# Patient Record
Sex: Female | Born: 1945
Health system: Southern US, Community
[De-identification: ages and names within clinical notes are randomized; demographics above are authoritative.]

## PROBLEM LIST (undated history)

## (undated) DIAGNOSIS — F32A Depression, unspecified: Secondary | ICD-10-CM

## (undated) DIAGNOSIS — E039 Hypothyroidism, unspecified: Secondary | ICD-10-CM

## (undated) DIAGNOSIS — K219 Gastro-esophageal reflux disease without esophagitis: Secondary | ICD-10-CM

## (undated) DIAGNOSIS — F329 Major depressive disorder, single episode, unspecified: Secondary | ICD-10-CM

## (undated) DIAGNOSIS — F419 Anxiety disorder, unspecified: Secondary | ICD-10-CM

## (undated) DIAGNOSIS — I10 Essential (primary) hypertension: Secondary | ICD-10-CM

## (undated) DIAGNOSIS — J449 Chronic obstructive pulmonary disease, unspecified: Secondary | ICD-10-CM

## (undated) DIAGNOSIS — E119 Type 2 diabetes mellitus without complications: Secondary | ICD-10-CM

## (undated) DIAGNOSIS — E785 Hyperlipidemia, unspecified: Secondary | ICD-10-CM

## (undated) DIAGNOSIS — K760 Fatty (change of) liver, not elsewhere classified: Secondary | ICD-10-CM

## (undated) HISTORY — DX: Essential (primary) hypertension: I10

## (undated) HISTORY — DX: Major depressive disorder, single episode, unspecified: F32.9

## (undated) HISTORY — DX: Depression, unspecified: F32.A

## (undated) HISTORY — DX: Gastro-esophageal reflux disease without esophagitis: K21.9

## (undated) HISTORY — DX: Anxiety disorder, unspecified: F41.9

## (undated) HISTORY — DX: Chronic obstructive pulmonary disease, unspecified: J44.9

## (undated) HISTORY — DX: Fatty (change of) liver, not elsewhere classified: K76.0

## (undated) HISTORY — DX: Type 2 diabetes mellitus without complications: E11.9

## (undated) HISTORY — PX: BREAST BIOPSY: SHX20

## (undated) HISTORY — DX: Hyperlipidemia, unspecified: E78.5

## (undated) HISTORY — DX: Hypothyroidism, unspecified: E03.9

## (undated) HISTORY — PX: TUBAL LIGATION: SHX77

---

## 2003-02-16 ENCOUNTER — Other Ambulatory Visit: Admission: RE | Admit: 2003-02-16 | Discharge: 2003-02-16 | Payer: Self-pay

## 2003-08-24 ENCOUNTER — Other Ambulatory Visit: Admission: RE | Admit: 2003-08-24 | Discharge: 2003-08-24 | Payer: Self-pay | Admitting: Dermatology

## 2004-12-21 ENCOUNTER — Ambulatory Visit: Payer: Self-pay | Admitting: Cardiology

## 2004-12-25 ENCOUNTER — Ambulatory Visit: Payer: Self-pay | Admitting: Cardiology

## 2010-10-25 ENCOUNTER — Other Ambulatory Visit (HOSPITAL_COMMUNITY): Payer: Self-pay | Admitting: "Endocrinology

## 2010-10-25 DIAGNOSIS — E049 Nontoxic goiter, unspecified: Secondary | ICD-10-CM

## 2010-10-30 ENCOUNTER — Ambulatory Visit (HOSPITAL_COMMUNITY)
Admission: RE | Admit: 2010-10-30 | Discharge: 2010-10-30 | Disposition: A | Discharge status: Home / Self Care | Source: Ambulatory Visit | Attending: "Endocrinology | Admitting: "Endocrinology

## 2010-10-30 DIAGNOSIS — E049 Nontoxic goiter, unspecified: Secondary | ICD-10-CM | POA: Insufficient documentation

## 2010-11-13 ENCOUNTER — Encounter: Payer: Self-pay | Admitting: Cardiovascular Disease

## 2011-05-03 ENCOUNTER — Other Ambulatory Visit (HOSPITAL_COMMUNITY): Payer: Self-pay | Admitting: "Endocrinology

## 2011-05-03 DIAGNOSIS — E049 Nontoxic goiter, unspecified: Secondary | ICD-10-CM

## 2011-05-17 DIAGNOSIS — R072 Precordial pain: Secondary | ICD-10-CM

## 2011-07-02 ENCOUNTER — Ambulatory Visit (HOSPITAL_COMMUNITY)
Admission: RE | Admit: 2011-07-02 | Discharge: 2011-07-02 | Disposition: A | Discharge status: Home / Self Care | Source: Ambulatory Visit | Attending: "Endocrinology | Admitting: "Endocrinology

## 2011-07-02 DIAGNOSIS — E042 Nontoxic multinodular goiter: Secondary | ICD-10-CM | POA: Insufficient documentation

## 2011-07-02 DIAGNOSIS — E049 Nontoxic goiter, unspecified: Secondary | ICD-10-CM

## 2012-08-05 DIAGNOSIS — R0789 Other chest pain: Secondary | ICD-10-CM

## 2012-10-15 ENCOUNTER — Other Ambulatory Visit (HOSPITAL_COMMUNITY): Payer: Self-pay | Admitting: "Endocrinology

## 2012-10-15 DIAGNOSIS — E049 Nontoxic goiter, unspecified: Secondary | ICD-10-CM

## 2012-11-13 ENCOUNTER — Ambulatory Visit (HOSPITAL_COMMUNITY)

## 2012-11-14 ENCOUNTER — Ambulatory Visit (HOSPITAL_COMMUNITY)
Admission: RE | Admit: 2012-11-14 | Discharge: 2012-11-14 | Disposition: A | Discharge status: Home / Self Care | Payer: Medicare Other | Source: Ambulatory Visit | Attending: "Endocrinology | Admitting: "Endocrinology

## 2012-11-14 DIAGNOSIS — E042 Nontoxic multinodular goiter: Secondary | ICD-10-CM | POA: Insufficient documentation

## 2012-11-14 DIAGNOSIS — E049 Nontoxic goiter, unspecified: Secondary | ICD-10-CM

## 2013-11-09 ENCOUNTER — Other Ambulatory Visit (HOSPITAL_COMMUNITY): Payer: Self-pay | Admitting: "Endocrinology

## 2013-11-09 DIAGNOSIS — E042 Nontoxic multinodular goiter: Secondary | ICD-10-CM

## 2013-12-07 ENCOUNTER — Ambulatory Visit (HOSPITAL_COMMUNITY): Payer: Medicare Other

## 2013-12-08 ENCOUNTER — Ambulatory Visit (HOSPITAL_COMMUNITY)
Admission: RE | Admit: 2013-12-08 | Discharge: 2013-12-08 | Disposition: A | Discharge status: Home / Self Care | Payer: Medicare Other | Source: Ambulatory Visit | Attending: "Endocrinology | Admitting: "Endocrinology

## 2013-12-08 DIAGNOSIS — E042 Nontoxic multinodular goiter: Secondary | ICD-10-CM | POA: Diagnosis not present

## 2014-10-25 ENCOUNTER — Other Ambulatory Visit (HOSPITAL_COMMUNITY): Payer: Self-pay | Admitting: Internal Medicine

## 2014-10-25 DIAGNOSIS — F17200 Nicotine dependence, unspecified, uncomplicated: Secondary | ICD-10-CM

## 2014-10-28 ENCOUNTER — Ambulatory Visit (HOSPITAL_COMMUNITY)
Admission: RE | Admit: 2014-10-28 | Discharge: 2014-10-28 | Disposition: A | Discharge status: Home / Self Care | Payer: Medicare Other | Source: Ambulatory Visit | Attending: Internal Medicine | Admitting: Internal Medicine

## 2014-10-28 DIAGNOSIS — J439 Emphysema, unspecified: Secondary | ICD-10-CM | POA: Diagnosis not present

## 2014-10-28 DIAGNOSIS — F17200 Nicotine dependence, unspecified, uncomplicated: Secondary | ICD-10-CM

## 2014-11-23 ENCOUNTER — Other Ambulatory Visit: Payer: Self-pay | Admitting: "Endocrinology

## 2014-12-14 LAB — HEMOGLOBIN A1C: HEMOGLOBIN A1C: 5.8 % (ref 4.0–6.0)

## 2014-12-16 ENCOUNTER — Ambulatory Visit: Payer: Self-pay | Admitting: "Endocrinology

## 2014-12-22 ENCOUNTER — Encounter: Payer: Self-pay | Admitting: "Endocrinology

## 2014-12-22 ENCOUNTER — Ambulatory Visit (INDEPENDENT_AMBULATORY_CARE_PROVIDER_SITE_OTHER): Payer: Medicare Other | Admitting: "Endocrinology

## 2014-12-22 VITALS — BP 114/72 | HR 73 | Ht 67.0 in | Wt 205.0 lb

## 2014-12-22 DIAGNOSIS — E038 Other specified hypothyroidism: Secondary | ICD-10-CM | POA: Insufficient documentation

## 2014-12-22 DIAGNOSIS — R7303 Prediabetes: Secondary | ICD-10-CM | POA: Diagnosis not present

## 2014-12-22 NOTE — Progress Notes (Signed)
Subjective:    Patient ID: Dana Lawson, female    DOB: May 06, 1945, PCP Kirstie Peri, MD   Past Medical History  Diagnosis Date  . Hyperlipidemia   . Hypothyroidism   . Hypertension    Past Surgical History  Procedure Laterality Date  . Tubal ligation     Social History   Social History  . Marital Status: Married    Spouse Name: N/A  . Number of Children: N/A  . Years of Education: N/A   Social History Main Topics  . Smoking status: Current Every Day Smoker  . Smokeless tobacco: None  . Alcohol Use: No  . Drug Use: No  . Sexual Activity: Not Asked   Other Topics Concern  . None   Social History Narrative  . None   Outpatient Encounter Prescriptions as of 12/22/2014  Medication Sig  . aspirin 81 MG tablet Take 81 mg by mouth daily.  Marland Kitchen atorvastatin (LIPITOR) 20 MG tablet Take 20 mg by mouth daily.  . benazepril (LOTENSIN) 40 MG tablet Take 40 mg by mouth daily.  Marland Kitchen buPROPion (WELLBUTRIN SR) 150 MG 12 hr tablet Take 150 mg by mouth 2 (two) times daily.  Marland Kitchen esomeprazole (NEXIUM) 40 MG capsule Take 40 mg by mouth daily at 12 noon.  . furosemide (LASIX) 20 MG tablet Take 20 mg by mouth.  . levothyroxine (SYNTHROID, LEVOTHROID) 88 MCG tablet Take 88 mcg by mouth daily before breakfast.  . metFORMIN (GLUCOPHAGE) 500 MG tablet TAKE 1 TABLET TWICE A DAY  . potassium chloride (K-DUR) 10 MEQ tablet Take 10 mEq by mouth daily.  . Vitamin D, Cholecalciferol, 400 UNITS TABS Take by mouth.  . clonazePAM (KLONOPIN) 0.5 MG tablet Take 0.5 mg by mouth 2 (two) times daily as needed for anxiety. Reported on 12/22/2014   No facility-administered encounter medications on file as of 12/22/2014.   ALLERGIES: Allergies  Allergen Reactions  . Flagyl [Metronidazole]    VACCINATION STATUS:  There is no immunization history on file for this patient.  HPI  69 yr old female with hypothyroidism, prediabetes, and obesity.she returns for f/u of same.  She is currently on LT4 88 mcg  po qam, metformin 500mg  po qday. She continues to improve. She lost 24 lbs overall. denies cold/heat intolerance. no interval issues.  her thyroid u/s is s/f for stable nodules. She feels better. she denies dysphagia, SOB, voice change.   Review of Systems   Constitutional: Has a steady weight since last visit , no fatigue, no subjective hyperthermia/hypothermia Eyes: no blurry vision, no xerophthalmia ENT: no sore throat, no nodules palpated in throat, no dysphagia/odynophagia, no hoarseness Cardiovascular: no CP/SOB/palpitations/leg swelling Respiratory: no cough/SOB Gastrointestinal: no N/V/D/C Musculoskeletal: no muscle/joint aches Skin: no rashes Neurological: no tremors/numbness/tingling/dizziness Psychiatric: no depression/anxiety  Objective:    BP 114/72 mmHg  Pulse 73  Ht 5\' 7"  (1.702 m)  Wt 205 lb (92.987 kg)  BMI 32.10 kg/m2  SpO2 95%  Wt Readings from Last 3 Encounters:  12/22/14 205 lb (92.987 kg)    Physical Exam  Constitutional: overweight, in NAD Eyes: PERRLA, EOMI, no exophthalmos ENT: moist mucous membranes, no thyromegaly, no cervical lymphadenopathy Cardiovascular: RRR, No MRG Respiratory: CTA B Gastrointestinal: abdomen soft, NT, ND, BS+ Musculoskeletal: no deformities, strength intact in all 4 Skin: moist, warm, no rashes Neurological: no tremor with outstretched hands, DTR normal in all 4    Assessment & Plan:   1. Other specified hypothyroidism TFTs are stable. she will continue LT4 88  mcg po qam.  - We discussed about correct intake of levothyroxine, at fasting, with water, separated by at least 30 minutes from breakfast, and separated by more than 4 hours from calcium, iron, multivitamins, acid reflux medications (PPIs). -Patient is made aware of the fact that thyroid hormone replacement is needed for life, dose to be adjusted by periodic monitoring of thyroid function tests. Her thyroid ultrasound is s/f stable nodules .  2. Pre-diabetes    She has controlled prediabetes with A1c of  5.8%, continue MTF 500mg  podaily. she lost 24 lbs . I recounseled her with CHO restriction and regular exercise.  I gave her dietary advise.    - I advised patient to maintain close follow up with [facility], MD for primary care needs. Follow up plan: Return in about 6 months (around 06/22/2015) for prediabetes, underactive thyroid, follow up with pre-visit labs.  06/24/2015, MD Phone: [phone]  Fax: [phone]   12/22/2014, 4:37 PM

## 2015-02-03 DIAGNOSIS — J438 Other emphysema: Secondary | ICD-10-CM | POA: Diagnosis not present

## 2015-02-20 ENCOUNTER — Other Ambulatory Visit: Payer: Self-pay | Admitting: "Endocrinology

## 2015-03-29 DIAGNOSIS — F17200 Nicotine dependence, unspecified, uncomplicated: Secondary | ICD-10-CM | POA: Diagnosis not present

## 2015-03-29 DIAGNOSIS — Z6833 Body mass index (BMI) 33.0-33.9, adult: Secondary | ICD-10-CM | POA: Diagnosis not present

## 2015-03-29 DIAGNOSIS — Z1389 Encounter for screening for other disorder: Secondary | ICD-10-CM | POA: Diagnosis not present

## 2015-03-29 DIAGNOSIS — F329 Major depressive disorder, single episode, unspecified: Secondary | ICD-10-CM | POA: Diagnosis not present

## 2015-05-06 DIAGNOSIS — J449 Chronic obstructive pulmonary disease, unspecified: Secondary | ICD-10-CM | POA: Diagnosis not present

## 2015-05-06 DIAGNOSIS — M069 Rheumatoid arthritis, unspecified: Secondary | ICD-10-CM | POA: Diagnosis not present

## 2015-05-21 ENCOUNTER — Other Ambulatory Visit: Payer: Self-pay | Admitting: "Endocrinology

## 2015-05-25 DIAGNOSIS — M069 Rheumatoid arthritis, unspecified: Secondary | ICD-10-CM | POA: Diagnosis not present

## 2015-05-25 DIAGNOSIS — J449 Chronic obstructive pulmonary disease, unspecified: Secondary | ICD-10-CM | POA: Diagnosis not present

## 2015-06-23 ENCOUNTER — Ambulatory Visit: Payer: Medicare Other | Admitting: "Endocrinology

## 2015-07-13 ENCOUNTER — Other Ambulatory Visit: Payer: Self-pay | Admitting: "Endocrinology

## 2015-07-13 DIAGNOSIS — E063 Autoimmune thyroiditis: Secondary | ICD-10-CM

## 2015-07-13 DIAGNOSIS — E038 Other specified hypothyroidism: Secondary | ICD-10-CM | POA: Diagnosis not present

## 2015-07-14 LAB — T4, FREE: Free T4: 1.3 ng/dL (ref 0.8–1.8)

## 2015-07-14 LAB — TSH: TSH: 2.72 mIU/L

## 2015-07-25 ENCOUNTER — Ambulatory Visit: Payer: Medicare Other | Admitting: "Endocrinology

## 2015-07-28 DIAGNOSIS — J449 Chronic obstructive pulmonary disease, unspecified: Secondary | ICD-10-CM | POA: Diagnosis not present

## 2015-07-28 DIAGNOSIS — E7800 Pure hypercholesterolemia, unspecified: Secondary | ICD-10-CM | POA: Diagnosis not present

## 2015-07-28 DIAGNOSIS — K219 Gastro-esophageal reflux disease without esophagitis: Secondary | ICD-10-CM | POA: Diagnosis not present

## 2015-07-28 DIAGNOSIS — Z299 Encounter for prophylactic measures, unspecified: Secondary | ICD-10-CM | POA: Diagnosis not present

## 2015-07-28 DIAGNOSIS — F329 Major depressive disorder, single episode, unspecified: Secondary | ICD-10-CM | POA: Diagnosis not present

## 2015-08-11 ENCOUNTER — Encounter: Payer: Self-pay | Admitting: "Endocrinology

## 2015-08-11 ENCOUNTER — Ambulatory Visit (INDEPENDENT_AMBULATORY_CARE_PROVIDER_SITE_OTHER): Payer: Medicare Other | Admitting: "Endocrinology

## 2015-08-11 ENCOUNTER — Other Ambulatory Visit: Payer: Self-pay | Admitting: *Deleted

## 2015-08-11 VITALS — BP 114/76 | HR 85 | Wt 214.4 lb

## 2015-08-11 DIAGNOSIS — R7303 Prediabetes: Secondary | ICD-10-CM

## 2015-08-11 DIAGNOSIS — E038 Other specified hypothyroidism: Secondary | ICD-10-CM

## 2015-08-11 NOTE — Progress Notes (Signed)
Subjective:    Patient ID: Dana Lawson, female    DOB: 11-08-1945, PCP Kirstie Peri, MD   Past Medical History:  Diagnosis Date  . Hyperlipidemia   . Hypertension   . Hypothyroidism    Past Surgical History:  Procedure Laterality Date  . TUBAL LIGATION     Social History   Social History  . Marital status: Married    Spouse name: N/A  . Number of children: N/A  . Years of education: N/A   Social History Main Topics  . Smoking status: Current Every Day Smoker  . Smokeless tobacco: None  . Alcohol use No  . Drug use: No  . Sexual activity: Not Asked   Other Topics Concern  . None   Social History Narrative  . None   Outpatient Encounter Prescriptions as of 08/11/2015  Medication Sig  . omeprazole (PRILOSEC) 10 MG capsule Take 10 mg by mouth daily.  Marland Kitchen aspirin 81 MG tablet Take 81 mg by mouth daily.  Marland Kitchen atorvastatin (LIPITOR) 20 MG tablet Take 20 mg by mouth daily.  . benazepril (LOTENSIN) 40 MG tablet Take 40 mg by mouth daily.  Marland Kitchen buPROPion (WELLBUTRIN SR) 150 MG 12 hr tablet Take 150 mg by mouth 2 (two) times daily.  . furosemide (LASIX) 20 MG tablet Take 20 mg by mouth.  . levothyroxine (SYNTHROID, LEVOTHROID) 88 MCG tablet Take 88 mcg by mouth daily before breakfast.  . metFORMIN (GLUCOPHAGE) 500 MG tablet TAKE 1 TABLET TWICE A DAY  . potassium chloride (K-DUR) 10 MEQ tablet Take 10 mEq by mouth daily.  . Vitamin D, Cholecalciferol, 400 UNITS TABS Take by mouth.  . [DISCONTINUED] clonazePAM (KLONOPIN) 0.5 MG tablet Take 0.5 mg by mouth 2 (two) times daily as needed for anxiety. Reported on 12/22/2014  . [DISCONTINUED] esomeprazole (NEXIUM) 40 MG capsule Take 40 mg by mouth daily at 12 noon.   No facility-administered encounter medications on file as of 08/11/2015.    ALLERGIES: Allergies  Allergen Reactions  . Flagyl [Metronidazole]    VACCINATION STATUS:  There is no immunization history on file for this patient.  HPI  70 yr old female with  hypothyroidism, prediabetes, and obesity.she returns for f/u of same.  She is currently on LT4 88 mcg po qam, metformin 500mg  po qday. She continues to improve. She  has gained 10 pounds after she lost 24 lbs overall. denies cold/heat intolerance. no interval issues.  her thyroid u/s is s/f for stable nodules. She feels better. she denies dysphagia, SOB, voice change.   Review of Systems   Constitutional: Has a steady weight since last visit , no fatigue, no subjective hyperthermia/hypothermia Eyes: no blurry vision, no xerophthalmia ENT: no sore throat, no nodules palpated in throat, no dysphagia/odynophagia, no hoarseness Cardiovascular: no CP/SOB/palpitations/leg swelling Respiratory: no cough/SOB Gastrointestinal: no N/V/D/C Musculoskeletal: no muscle/joint aches Skin: no rashes Neurological: no tremors/numbness/tingling/dizziness Psychiatric: no depression/anxiety  Objective:    BP 114/76   Pulse 85   Wt 214 lb 6 oz (97.2 kg)   BMI 33.58 kg/m   Wt Readings from Last 3 Encounters:  08/11/15 214 lb 6 oz (97.2 kg)  12/22/14 205 lb (93 kg)    Physical Exam  Constitutional: obese, in NAD Eyes: PERRLA, EOMI, no exophthalmos ENT: moist mucous membranes, no thyromegaly, no cervical lymphadenopathy Cardiovascular: RRR, No MRG Respiratory: CTA B Gastrointestinal: abdomen soft, NT, ND, BS+ Musculoskeletal: no deformities, strength intact in all 4 Skin: moist, warm, no rashes Neurological: no tremor with  outstretched hands, DTR normal in all 4    Assessment & Plan:   1. Other specified hypothyroidism TFTs are Consistent with appropriate replacement with thyroid hormone. she will continue LT4 88 mcg po qam.  - We discussed about correct intake of levothyroxine, at fasting, with water, separated by at least 30 minutes from breakfast, and separated by more than 4 hours from calcium, iron, multivitamins, acid reflux medications (PPIs). -Patient is made aware of the fact that  thyroid hormone replacement is needed for life, dose to be adjusted by periodic monitoring of thyroid function tests. Her thyroid ultrasound is s/f stable nodules .  2. Pre-diabetes   She has controlled prediabetes with A1c of  5.8%, continue MTF 500mg  podaily. She has regained 10 pounds after lost 24 lbs . I recounseled her with CHO restriction and regular exercise.  I gave her dietary advise.    - I advised patient to maintain close follow up with [facility], MD for primary care needs. Follow up plan: Return in about 6 months (around 02/11/2016) for follow up with pre-visit labs.  04/10/2016, MD Phone: [phone]  Fax: [phone]   08/11/2015, 3:51 PM

## 2015-08-11 NOTE — Patient Instructions (Signed)

## 2015-08-21 ENCOUNTER — Other Ambulatory Visit: Payer: Self-pay | Admitting: "Endocrinology

## 2015-08-23 DIAGNOSIS — M069 Rheumatoid arthritis, unspecified: Secondary | ICD-10-CM | POA: Diagnosis not present

## 2015-08-23 DIAGNOSIS — J449 Chronic obstructive pulmonary disease, unspecified: Secondary | ICD-10-CM | POA: Diagnosis not present

## 2015-10-11 DIAGNOSIS — E7800 Pure hypercholesterolemia, unspecified: Secondary | ICD-10-CM | POA: Diagnosis not present

## 2015-10-11 DIAGNOSIS — Z1211 Encounter for screening for malignant neoplasm of colon: Secondary | ICD-10-CM | POA: Diagnosis not present

## 2015-10-11 DIAGNOSIS — E559 Vitamin D deficiency, unspecified: Secondary | ICD-10-CM | POA: Diagnosis not present

## 2015-10-11 DIAGNOSIS — Z6833 Body mass index (BMI) 33.0-33.9, adult: Secondary | ICD-10-CM | POA: Diagnosis not present

## 2015-10-11 DIAGNOSIS — Z299 Encounter for prophylactic measures, unspecified: Secondary | ICD-10-CM | POA: Diagnosis not present

## 2015-10-11 DIAGNOSIS — R5383 Other fatigue: Secondary | ICD-10-CM | POA: Diagnosis not present

## 2015-10-11 DIAGNOSIS — E039 Hypothyroidism, unspecified: Secondary | ICD-10-CM | POA: Diagnosis not present

## 2015-10-11 DIAGNOSIS — Z7189 Other specified counseling: Secondary | ICD-10-CM | POA: Diagnosis not present

## 2015-10-11 DIAGNOSIS — Z9229 Personal history of other drug therapy: Secondary | ICD-10-CM | POA: Diagnosis not present

## 2015-10-11 DIAGNOSIS — Z0000 Encounter for general adult medical examination without abnormal findings: Secondary | ICD-10-CM | POA: Diagnosis not present

## 2015-10-11 DIAGNOSIS — E1165 Type 2 diabetes mellitus with hyperglycemia: Secondary | ICD-10-CM | POA: Diagnosis not present

## 2015-10-11 DIAGNOSIS — Z1389 Encounter for screening for other disorder: Secondary | ICD-10-CM | POA: Diagnosis not present

## 2015-10-12 DIAGNOSIS — Z23 Encounter for immunization: Secondary | ICD-10-CM | POA: Diagnosis not present

## 2015-10-14 DIAGNOSIS — J449 Chronic obstructive pulmonary disease, unspecified: Secondary | ICD-10-CM | POA: Diagnosis not present

## 2015-10-14 DIAGNOSIS — M069 Rheumatoid arthritis, unspecified: Secondary | ICD-10-CM | POA: Diagnosis not present

## 2015-11-12 IMAGING — US US SOFT TISSUE HEAD/NECK
1 series · 14 of 25 positions shown · non-contrast
Comparison: 11/14/2012

CLINICAL DATA: 68-year-old female with a history of thyroid
nodules.

EXAM:
THYROID ULTRASOUND
TECHNIQUE: Ultrasound examination of the thyroid gland and adjacent soft
tissues was performed.

[Series 1: us soft tissue head/neck · 0.07mm/px · 14 of 33 slices shown]
[im 1/33]
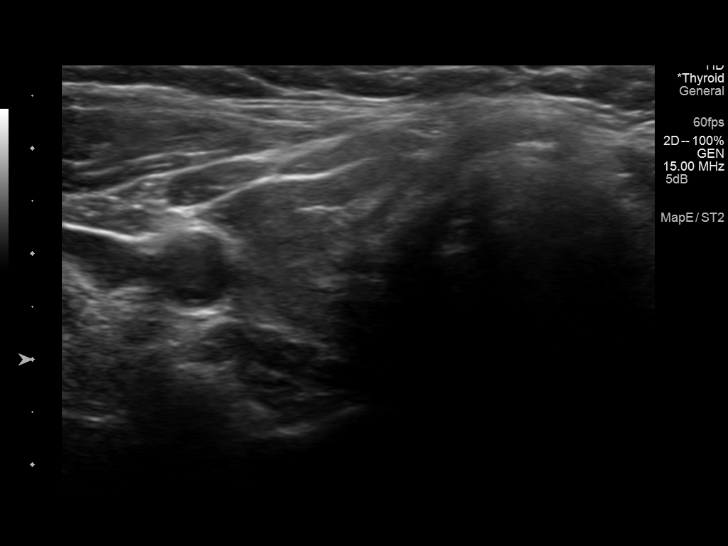
[im 3/33]
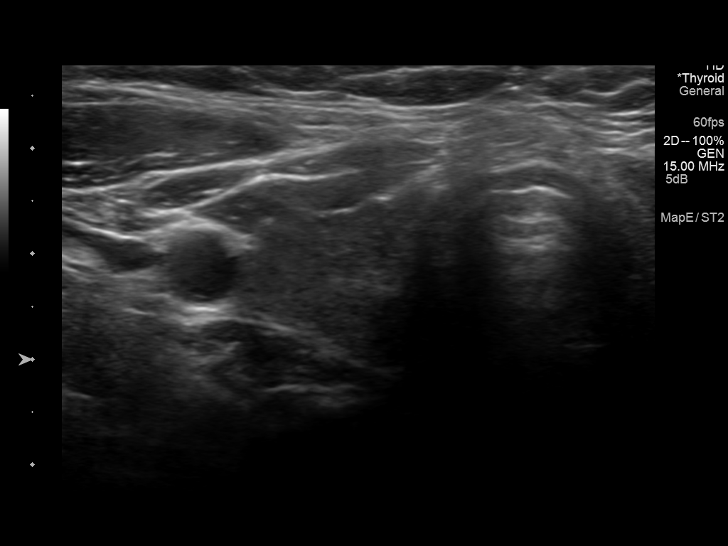
[im 6/33]
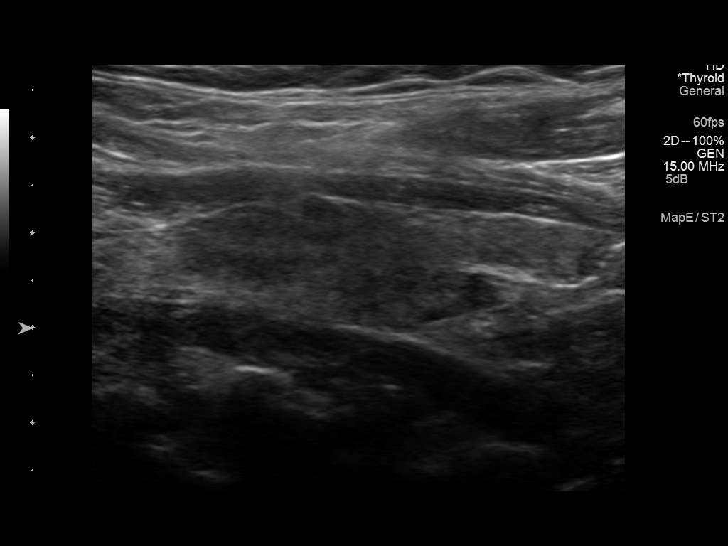
[im 9/33]
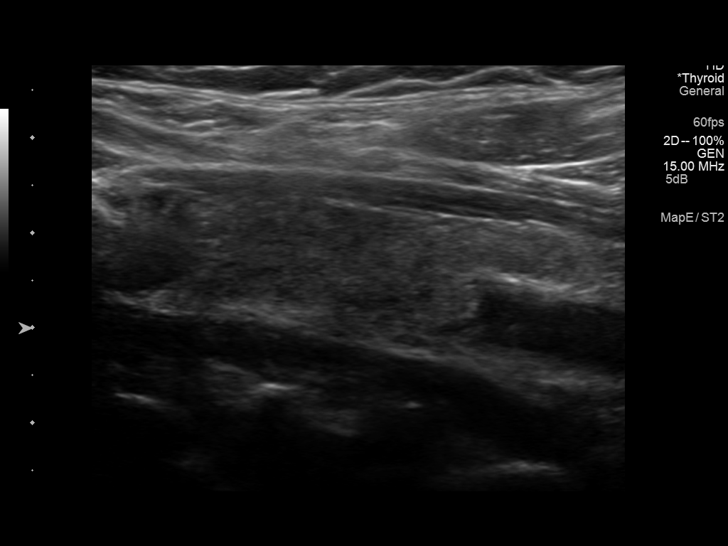
[im 11/33]
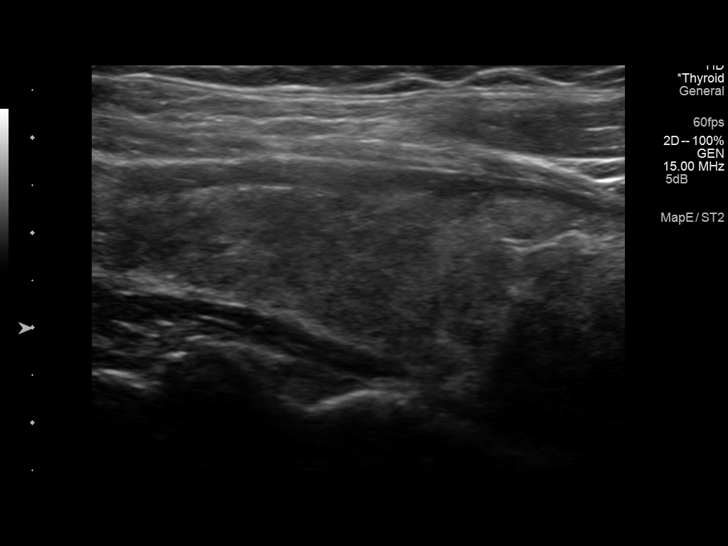
[im 13/33]
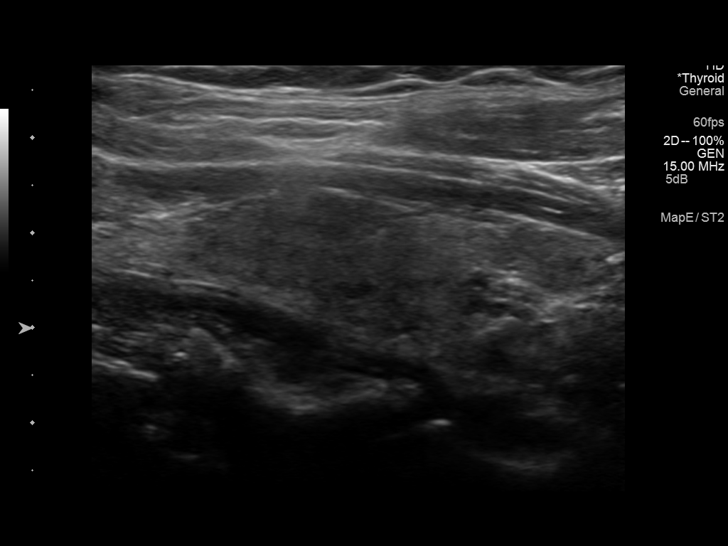
[im 15/33]
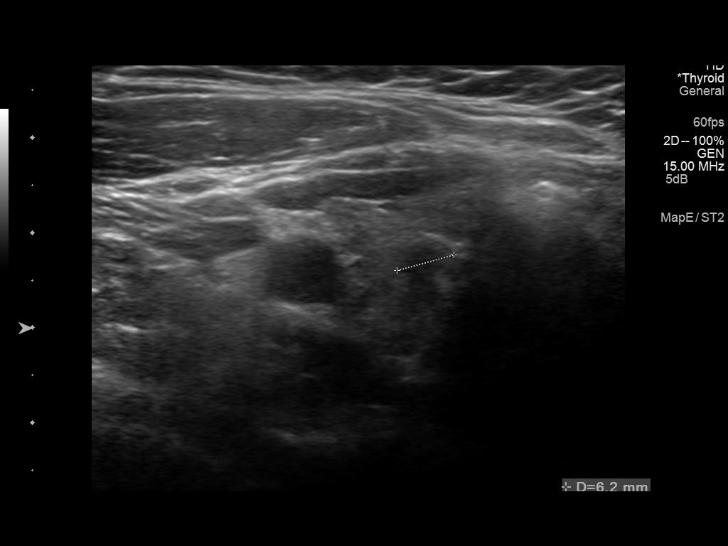
[im 18/33]
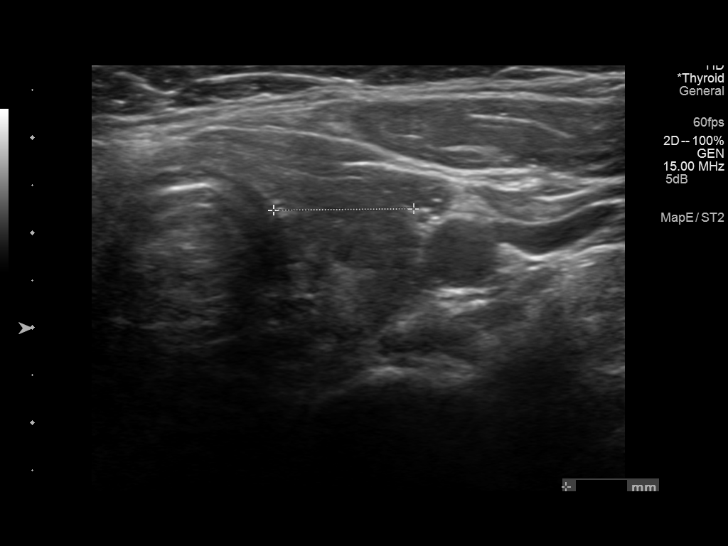
[im 21/33]
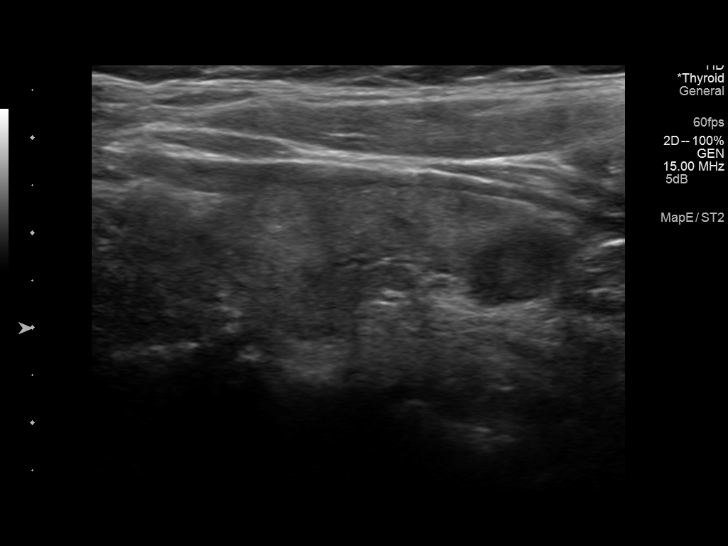
[im 22/33]
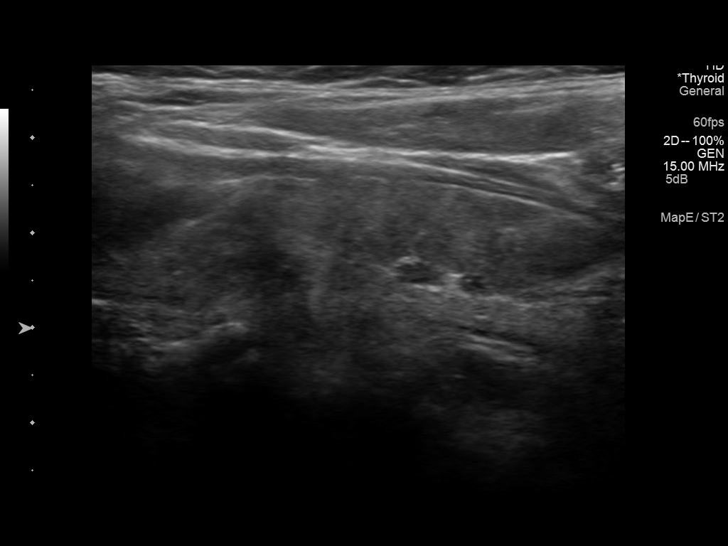
[im 25/33]
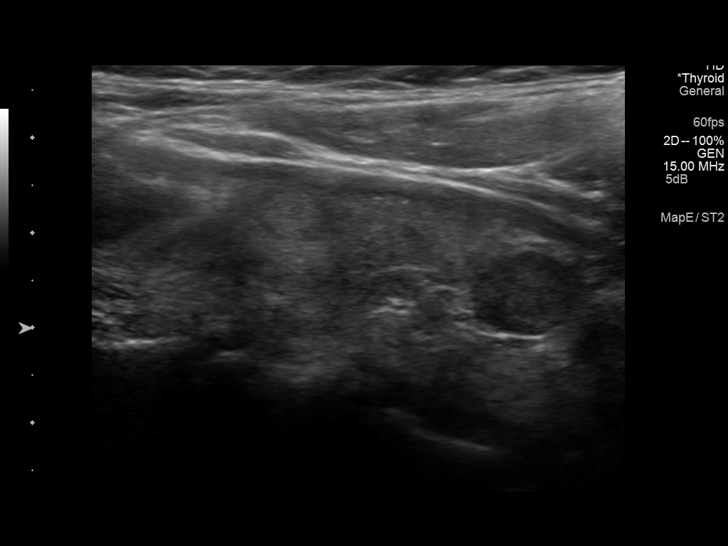
[im 27/33]
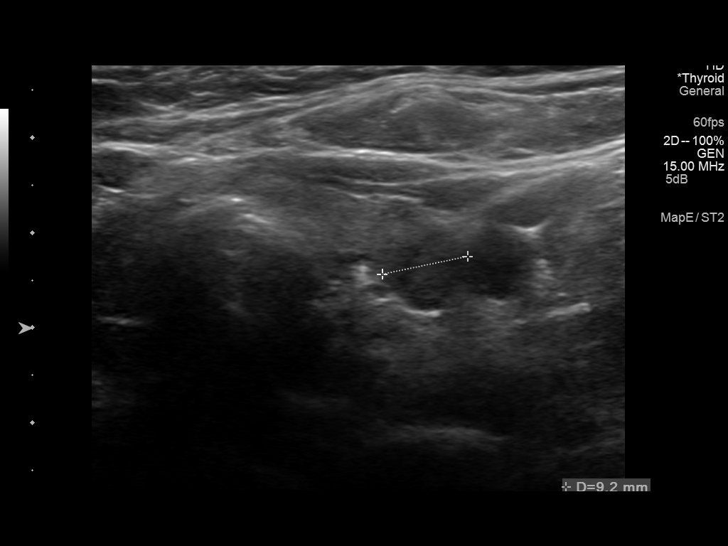
[im 30/33]
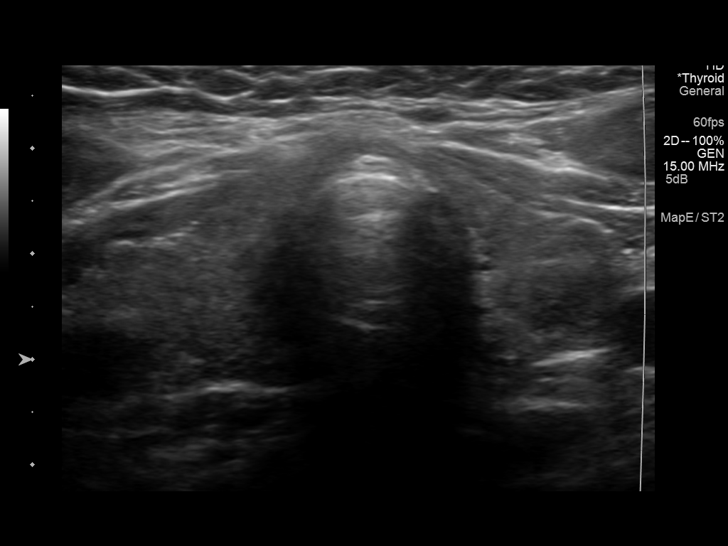
[im 33/33]
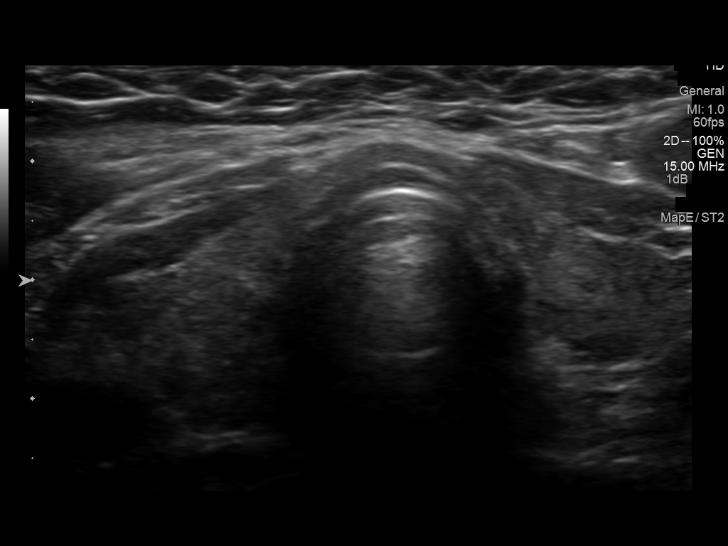

[14 of 25 positions shown; findings below may reference images not displayed]

FINDINGS: Right thyroid lobe

Measurements: 4.8 cm x 1.4 cm x 1.7 cm. Small heterogeneously echoic
nodule at the inferior pole of the right thyroid measures 1.0 cm x 5
mm x 6 mm (previously 1.0 cm x 6 mm x 5 mm).

Left thyroid lobe

Measurements: 4.5 cm x 1.7 cm x 1.5 cm. Small hypoechoic nodule at
the inferior pole the left thyroid measures 1.2 cm x 1.0 cm x 9 mm
(previously 1.2 cm x 9 mm x 9 mm.)

Isthmus

Thickness: 2 mm.  No nodules visualized.

Lymphadenopathy

None visualized.
IMPRESSION: Bilateral thyroid nodules, unchanged from comparison.

Findings do not meet current SRU consensus criteria for biopsy.
Follow-up by clinical exam is recommended. If patient has known risk
factors for thyroid carcinoma, consider follow-up ultrasound in 12
months. If patient is clinically hyperthyroid, consider nuclear
medicine thyroid uptake and scan.Reference: Management of Thyroid
Nodules Detected at US: Society of Radiologists in Ultrasound

## 2015-11-20 ENCOUNTER — Other Ambulatory Visit: Payer: Self-pay | Admitting: "Endocrinology

## 2015-11-28 DIAGNOSIS — H40013 Open angle with borderline findings, low risk, bilateral: Secondary | ICD-10-CM | POA: Diagnosis not present

## 2015-11-28 DIAGNOSIS — Z7984 Long term (current) use of oral hypoglycemic drugs: Secondary | ICD-10-CM | POA: Diagnosis not present

## 2015-11-28 DIAGNOSIS — E119 Type 2 diabetes mellitus without complications: Secondary | ICD-10-CM | POA: Diagnosis not present

## 2015-11-28 DIAGNOSIS — H2513 Age-related nuclear cataract, bilateral: Secondary | ICD-10-CM | POA: Diagnosis not present

## 2015-12-19 DIAGNOSIS — L900 Lichen sclerosus et atrophicus: Secondary | ICD-10-CM | POA: Diagnosis not present

## 2015-12-19 DIAGNOSIS — E669 Obesity, unspecified: Secondary | ICD-10-CM | POA: Diagnosis not present

## 2015-12-19 DIAGNOSIS — N644 Mastodynia: Secondary | ICD-10-CM | POA: Diagnosis not present

## 2015-12-19 DIAGNOSIS — Z01419 Encounter for gynecological examination (general) (routine) without abnormal findings: Secondary | ICD-10-CM | POA: Diagnosis not present

## 2015-12-19 DIAGNOSIS — Z6835 Body mass index (BMI) 35.0-35.9, adult: Secondary | ICD-10-CM | POA: Diagnosis not present

## 2015-12-19 DIAGNOSIS — N898 Other specified noninflammatory disorders of vagina: Secondary | ICD-10-CM | POA: Diagnosis not present

## 2015-12-28 DIAGNOSIS — N644 Mastodynia: Secondary | ICD-10-CM | POA: Diagnosis not present

## 2016-02-01 DIAGNOSIS — M1990 Unspecified osteoarthritis, unspecified site: Secondary | ICD-10-CM | POA: Diagnosis not present

## 2016-02-01 DIAGNOSIS — M79672 Pain in left foot: Secondary | ICD-10-CM | POA: Diagnosis not present

## 2016-02-09 ENCOUNTER — Other Ambulatory Visit: Payer: Self-pay | Admitting: "Endocrinology

## 2016-02-09 DIAGNOSIS — E038 Other specified hypothyroidism: Secondary | ICD-10-CM | POA: Diagnosis not present

## 2016-02-09 DIAGNOSIS — R7303 Prediabetes: Secondary | ICD-10-CM | POA: Diagnosis not present

## 2016-02-10 LAB — COMPLETE METABOLIC PANEL WITH GFR
ALK PHOS: 77 U/L (ref 33–130)
ALT: 40 U/L — ABNORMAL HIGH (ref 6–29)
AST: 37 U/L — AB (ref 10–35)
Albumin: 4 g/dL (ref 3.6–5.1)
BUN: 18 mg/dL (ref 7–25)
CALCIUM: 10.4 mg/dL (ref 8.6–10.4)
CHLORIDE: 103 mmol/L (ref 98–110)
CO2: 28 mmol/L (ref 20–31)
CREATININE: 1.13 mg/dL — AB (ref 0.50–0.99)
GFR, Est African American: 58 mL/min — ABNORMAL LOW (ref >=60)
GFR, Est Non African American: 50 mL/min — ABNORMAL LOW (ref >=60)
Glucose, Bld: 150 mg/dL — ABNORMAL HIGH (ref 65–99)
Potassium: 4.4 mmol/L (ref 3.5–5.3)
Sodium: 141 mmol/L (ref 135–146)
TOTAL PROTEIN: 6.7 g/dL (ref 6.1–8.1)
Total Bilirubin: 0.5 mg/dL (ref 0.2–1.2)

## 2016-02-10 LAB — T4, FREE: Free T4: 1.5 ng/dL (ref 0.8–1.8)

## 2016-02-10 LAB — TSH: TSH: 4.25 mIU/L

## 2016-02-10 LAB — HEMOGLOBIN A1C
Hgb A1c MFr Bld: 5.9 % — ABNORMAL HIGH (ref <5.7)
Mean Plasma Glucose: 123 mg/dL

## 2016-02-16 ENCOUNTER — Encounter: Payer: Self-pay | Admitting: "Endocrinology

## 2016-02-16 ENCOUNTER — Ambulatory Visit: Payer: Medicare Other | Admitting: "Endocrinology

## 2016-02-16 ENCOUNTER — Ambulatory Visit (INDEPENDENT_AMBULATORY_CARE_PROVIDER_SITE_OTHER): Payer: Medicare Other | Admitting: "Endocrinology

## 2016-02-16 VITALS — BP 139/77 | HR 82 | Ht 67.0 in | Wt 228.0 lb

## 2016-02-16 DIAGNOSIS — R7303 Prediabetes: Secondary | ICD-10-CM

## 2016-02-16 DIAGNOSIS — E038 Other specified hypothyroidism: Secondary | ICD-10-CM

## 2016-02-16 MED ORDER — LEVOTHYROXINE SODIUM 100 MCG PO TABS: 100.0000 ug | ORAL_TABLET | Freq: Every day | ORAL | 2 refills | Status: DC

## 2016-02-16 NOTE — Progress Notes (Signed)
Subjective:    Patient ID: Dana Lawson, female    DOB: 02-15-1945, PCP Kirstie Peri, MD   Past Medical History:  Diagnosis Date  . Hyperlipidemia   . Hypertension   . Hypothyroidism    Past Surgical History:  Procedure Laterality Date  . TUBAL LIGATION     Social History   Social History  . Marital status: Married    Spouse name: N/A  . Number of children: N/A  . Years of education: N/A   Social History Main Topics  . Smoking status: Current Every Day Smoker  . Smokeless tobacco: Never Used  . Alcohol use No  . Drug use: No  . Sexual activity: Not Asked   Other Topics Concern  . None   Social History Narrative  . None   Outpatient Encounter Prescriptions as of 02/16/2016  Medication Sig  . aspirin 81 MG tablet Take 81 mg by mouth daily.  Marland Kitchen atorvastatin (LIPITOR) 20 MG tablet Take 20 mg by mouth daily.  . benazepril (LOTENSIN) 40 MG tablet Take 40 mg by mouth daily.  Marland Kitchen buPROPion (WELLBUTRIN SR) 150 MG 12 hr tablet Take 150 mg by mouth 2 (two) times daily.  . furosemide (LASIX) 20 MG tablet Take 20 mg by mouth.  . levothyroxine (SYNTHROID, LEVOTHROID) 100 MCG tablet Take 1 tablet (100 mcg total) by mouth daily before breakfast.  . meloxicam (MOBIC) 15 MG tablet   . metFORMIN (GLUCOPHAGE) 500 MG tablet Take 1 tablet (500 mg total) by mouth daily.  Marland Kitchen omeprazole (PRILOSEC) 10 MG capsule Take 10 mg by mouth daily.  . potassium chloride (K-DUR) 10 MEQ tablet Take 10 mEq by mouth daily.  . Vitamin D, Cholecalciferol, 400 UNITS TABS Take by mouth.  . [DISCONTINUED] levothyroxine (SYNTHROID, LEVOTHROID) 88 MCG tablet Take 88 mcg by mouth daily before breakfast.   No facility-administered encounter medications on file as of 02/16/2016.    ALLERGIES: Allergies  Allergen Reactions  . Flagyl [Metronidazole]    VACCINATION STATUS:  There is no immunization history on file for this patient.  HPI  71 yr old female with hypothyroidism, prediabetes, and obesity.she  returns for f/u of same.  She is currently on LT4 88 mcg po qam, metformin 500mg  po qday. She continues to improve. She  has gained weight. denies cold/heat intolerance. no interval issues.  her thyroid u/s is s/f for stable nodules. She feels better. she denies dysphagia, SOB, voice change.   Review of Systems   Constitutional: Has a steady weight gaint , no fatigue, no subjective hyperthermia/hypothermia Eyes: no blurry vision, no xerophthalmia ENT: no sore throat, no nodules palpated in throat, no dysphagia/odynophagia, no hoarseness Cardiovascular: no CP/SOB/palpitations/leg swelling Respiratory: no cough/SOB Gastrointestinal: no N/V/D/C Musculoskeletal: no muscle/joint aches Skin: no rashes Neurological: no tremors/numbness/tingling/dizziness Psychiatric: no depression/anxiety  Objective:    BP 139/77   Pulse 82   Ht 5\' 7"  (1.702 m)   Wt 228 lb (103.4 kg)   BMI 35.71 kg/m   Wt Readings from Last 3 Encounters:  02/16/16 228 lb (103.4 kg)  08/11/15 214 lb 6 oz (97.2 kg)  12/22/14 205 lb (93 kg)    Physical Exam  Constitutional: obese, in NAD Eyes: PERRLA, EOMI, no exophthalmos ENT: moist mucous membranes, no thyromegaly, no cervical lymphadenopathy Cardiovascular: RRR, No MRG Respiratory: CTA B Gastrointestinal: abdomen soft, NT, ND, BS+ Musculoskeletal: no deformities, strength intact in all 4 Skin: moist, warm, no rashes Neurological: no tremor with outstretched hands, DTR normal in all 4  Recent Results (from the past 2160 hour(s))  COMPLETE METABOLIC PANEL WITH GFR     Status: Abnormal   Collection Time: 02/09/16  2:07 PM  Result Value Ref Range   Sodium 141 135 - 146 mmol/L   Potassium 4.4 3.5 - 5.3 mmol/L   Chloride 103 98 - 110 mmol/L   CO2 28 20 - 31 mmol/L   Glucose, Bld 150 (H) 65 - 99 mg/dL   BUN 18 7 - 25 mg/dL   Creat 0.24 (H) 0.97 - 0.99 mg/dL    Comment:   For patients > or = 69 years of age: The upper reference limit for Creatinine is  approximately 13% higher for people identified as African-American.      Total Bilirubin 0.5 0.2 - 1.2 mg/dL   Alkaline Phosphatase 77 33 - 130 U/L   AST 37 (H) 10 - 35 U/L   ALT 40 (H) 6 - 29 U/L   Total Protein 6.7 6.1 - 8.1 g/dL   Albumin 4.0 3.6 - 5.1 g/dL   Calcium 35.3 8.6 - 29.9 mg/dL   GFR, Est African American 58 (L) >=60 mL/min   GFR, Est Non African American 50 (L) >=60 mL/min  TSH     Status: None   Collection Time: 02/09/16  2:07 PM  Result Value Ref Range   TSH 4.25 mIU/L    Comment:   Reference Range   > or = 20 Years  0.40-4.50   Pregnancy Range First trimester  0.26-2.66 Second trimester 0.55-2.73 Third trimester  0.43-2.91     T4, free     Status: None   Collection Time: 02/09/16  2:07 PM  Result Value Ref Range   Free T4 1.5 0.8 - 1.8 ng/dL  Hemoglobin M4Q     Status: Abnormal   Collection Time: 02/09/16  2:07 PM  Result Value Ref Range   Hgb A1c MFr Bld 5.9 (H) <5.7 %    Comment:   For someone without known diabetes, a hemoglobin A1c value between 5.7% and 6.4% is consistent with prediabetes and should be confirmed with a follow-up test.   For someone with known diabetes, a value <7% indicates that their diabetes is well controlled. A1c targets should be individualized based on duration of diabetes, age, co-morbid conditions and other considerations.   This assay result is consistent with an increased risk of diabetes.   Currently, no consensus exists regarding use of hemoglobin A1c for diagnosis of diabetes in children.      Mean Plasma Glucose 123 mg/dL     Assessment & Plan:   1. Other specified hypothyroidism TFTs are Consistent with under replacement with thyroid hormone. - I will increase her levothyroxine to 100 g by mouth every morning.  - We discussed about correct intake of levothyroxine, at fasting, with water, separated by at least 30 minutes from breakfast, and separated by more than 4 hours from calcium, iron,  multivitamins, acid reflux medications (PPIs). -Patient is made aware of the fact that thyroid hormone replacement is needed for life, dose to be adjusted by periodic monitoring of thyroid function tests. Her thyroid ultrasound is s/f stable nodules .  2. Pre-diabetes   She has controlled prediabetes with A1c of  5.9%, continue MTF 500mg  podaily. She has regained 14 more  pounds  since last visit.  I recounseled her with CHO restriction and regular exercise.  I gave her dietary advise.    - I advised patient to maintain close follow up with [facility],  MD for primary care needs. Follow up plan: Return in about 6 months (around 08/15/2016) for follow up with pre-visit labs.  Marquis Lunch, MD Phone: [phone]  Fax: [phone]   02/16/2016, 4:50 PM

## 2016-03-05 DIAGNOSIS — N898 Other specified noninflammatory disorders of vagina: Secondary | ICD-10-CM | POA: Diagnosis not present

## 2016-03-05 DIAGNOSIS — Z6836 Body mass index (BMI) 36.0-36.9, adult: Secondary | ICD-10-CM | POA: Diagnosis not present

## 2016-03-05 DIAGNOSIS — Z124 Encounter for screening for malignant neoplasm of cervix: Secondary | ICD-10-CM | POA: Diagnosis not present

## 2016-04-03 DIAGNOSIS — J449 Chronic obstructive pulmonary disease, unspecified: Secondary | ICD-10-CM | POA: Diagnosis not present

## 2016-04-03 DIAGNOSIS — M069 Rheumatoid arthritis, unspecified: Secondary | ICD-10-CM | POA: Diagnosis not present

## 2016-05-03 ENCOUNTER — Telehealth: Payer: Self-pay | Admitting: "Endocrinology

## 2016-05-03 NOTE — Telephone Encounter (Signed)
Called pt. She wanted to verify which levothyroxine dosage she is supposed to be on.

## 2016-05-03 NOTE — Telephone Encounter (Signed)
Dana Lawson has questions about her levothyroxine (SYNTHROID, LEVOTHROID) 100 MCG tablet dosage, please advise?

## 2016-05-14 DIAGNOSIS — M79673 Pain in unspecified foot: Secondary | ICD-10-CM | POA: Diagnosis not present

## 2016-05-14 DIAGNOSIS — R600 Localized edema: Secondary | ICD-10-CM | POA: Diagnosis not present

## 2016-05-14 DIAGNOSIS — M069 Rheumatoid arthritis, unspecified: Secondary | ICD-10-CM | POA: Diagnosis not present

## 2016-05-14 DIAGNOSIS — M25571 Pain in right ankle and joints of right foot: Secondary | ICD-10-CM | POA: Diagnosis not present

## 2016-05-14 DIAGNOSIS — E1142 Type 2 diabetes mellitus with diabetic polyneuropathy: Secondary | ICD-10-CM | POA: Diagnosis not present

## 2016-05-14 DIAGNOSIS — J449 Chronic obstructive pulmonary disease, unspecified: Secondary | ICD-10-CM | POA: Diagnosis not present

## 2016-05-16 DIAGNOSIS — J449 Chronic obstructive pulmonary disease, unspecified: Secondary | ICD-10-CM | POA: Diagnosis not present

## 2016-05-16 DIAGNOSIS — E039 Hypothyroidism, unspecified: Secondary | ICD-10-CM | POA: Diagnosis not present

## 2016-05-16 DIAGNOSIS — E118 Type 2 diabetes mellitus with unspecified complications: Secondary | ICD-10-CM | POA: Diagnosis not present

## 2016-05-16 DIAGNOSIS — Z6835 Body mass index (BMI) 35.0-35.9, adult: Secondary | ICD-10-CM | POA: Diagnosis not present

## 2016-05-16 DIAGNOSIS — E7800 Pure hypercholesterolemia, unspecified: Secondary | ICD-10-CM | POA: Diagnosis not present

## 2016-05-16 DIAGNOSIS — K219 Gastro-esophageal reflux disease without esophagitis: Secondary | ICD-10-CM | POA: Diagnosis not present

## 2016-05-16 DIAGNOSIS — I739 Peripheral vascular disease, unspecified: Secondary | ICD-10-CM | POA: Diagnosis not present

## 2016-05-16 DIAGNOSIS — Z299 Encounter for prophylactic measures, unspecified: Secondary | ICD-10-CM | POA: Diagnosis not present

## 2016-05-16 DIAGNOSIS — E1165 Type 2 diabetes mellitus with hyperglycemia: Secondary | ICD-10-CM | POA: Diagnosis not present

## 2016-05-16 DIAGNOSIS — I4891 Unspecified atrial fibrillation: Secondary | ICD-10-CM | POA: Diagnosis not present

## 2016-05-16 DIAGNOSIS — R600 Localized edema: Secondary | ICD-10-CM | POA: Diagnosis not present

## 2016-05-16 DIAGNOSIS — F329 Major depressive disorder, single episode, unspecified: Secondary | ICD-10-CM | POA: Diagnosis not present

## 2016-05-29 ENCOUNTER — Other Ambulatory Visit: Payer: Self-pay | Admitting: "Endocrinology

## 2016-07-16 DIAGNOSIS — E1165 Type 2 diabetes mellitus with hyperglycemia: Secondary | ICD-10-CM | POA: Diagnosis not present

## 2016-07-16 DIAGNOSIS — I4891 Unspecified atrial fibrillation: Secondary | ICD-10-CM | POA: Diagnosis not present

## 2016-07-16 DIAGNOSIS — E7800 Pure hypercholesterolemia, unspecified: Secondary | ICD-10-CM | POA: Diagnosis not present

## 2016-07-16 DIAGNOSIS — Z299 Encounter for prophylactic measures, unspecified: Secondary | ICD-10-CM | POA: Diagnosis not present

## 2016-07-16 DIAGNOSIS — F419 Anxiety disorder, unspecified: Secondary | ICD-10-CM | POA: Diagnosis not present

## 2016-07-16 DIAGNOSIS — I739 Peripheral vascular disease, unspecified: Secondary | ICD-10-CM | POA: Diagnosis not present

## 2016-07-16 DIAGNOSIS — E039 Hypothyroidism, unspecified: Secondary | ICD-10-CM | POA: Diagnosis not present

## 2016-07-16 DIAGNOSIS — I10 Essential (primary) hypertension: Secondary | ICD-10-CM | POA: Diagnosis not present

## 2016-07-16 DIAGNOSIS — J449 Chronic obstructive pulmonary disease, unspecified: Secondary | ICD-10-CM | POA: Diagnosis not present

## 2016-07-16 DIAGNOSIS — L0390 Cellulitis, unspecified: Secondary | ICD-10-CM | POA: Diagnosis not present

## 2016-07-16 DIAGNOSIS — R0609 Other forms of dyspnea: Secondary | ICD-10-CM | POA: Diagnosis not present

## 2016-07-16 DIAGNOSIS — F329 Major depressive disorder, single episode, unspecified: Secondary | ICD-10-CM | POA: Diagnosis not present

## 2016-08-09 ENCOUNTER — Other Ambulatory Visit: Payer: Self-pay | Admitting: "Endocrinology

## 2016-08-09 DIAGNOSIS — E038 Other specified hypothyroidism: Secondary | ICD-10-CM | POA: Diagnosis not present

## 2016-08-10 LAB — TSH: TSH: 4.48 mIU/L

## 2016-08-10 LAB — T4, FREE: FREE T4: 1.2 ng/dL (ref 0.8–1.8)

## 2016-08-15 ENCOUNTER — Ambulatory Visit: Payer: Medicare Other | Admitting: "Endocrinology

## 2016-08-16 DIAGNOSIS — J449 Chronic obstructive pulmonary disease, unspecified: Secondary | ICD-10-CM | POA: Diagnosis not present

## 2016-08-16 DIAGNOSIS — M069 Rheumatoid arthritis, unspecified: Secondary | ICD-10-CM | POA: Diagnosis not present

## 2016-09-05 ENCOUNTER — Encounter: Payer: Self-pay | Admitting: "Endocrinology

## 2016-09-05 ENCOUNTER — Ambulatory Visit (INDEPENDENT_AMBULATORY_CARE_PROVIDER_SITE_OTHER): Payer: Medicare Other | Admitting: "Endocrinology

## 2016-09-05 VITALS — BP 139/72 | HR 82 | Ht 67.0 in | Wt 235.0 lb

## 2016-09-05 DIAGNOSIS — E038 Other specified hypothyroidism: Secondary | ICD-10-CM

## 2016-09-05 DIAGNOSIS — R7303 Prediabetes: Secondary | ICD-10-CM

## 2016-09-05 DIAGNOSIS — E042 Nontoxic multinodular goiter: Secondary | ICD-10-CM | POA: Diagnosis not present

## 2016-09-05 MED ORDER — LEVOTHYROXINE SODIUM 112 MCG PO TABS: 112.0000 ug | ORAL_TABLET | Freq: Every day | ORAL | 2 refills | Status: DC

## 2016-09-05 NOTE — Progress Notes (Signed)
Subjective:    Patient ID: Dana Lawson, female    DOB: [birth year], PCP Kirstie Peri, MD   Past Medical History:  Diagnosis Date  . Hyperlipidemia   . Hypertension   . Hypothyroidism    Past Surgical History:  Procedure Laterality Date  . TUBAL LIGATION     Social History   Social History  . Marital status: Married    Spouse name: N/A  . Number of children: N/A  . Years of education: N/A   Social History Main Topics  . Smoking status: Current Every Day Smoker  . Smokeless tobacco: Never Used  . Alcohol use No  . Drug use: No  . Sexual activity: Not Asked   Other Topics Concern  . None   Social History Narrative  . None   Outpatient Encounter Prescriptions as of 09/05/2016  Medication Sig  . aspirin 81 MG tablet Take 81 mg by mouth daily.  Marland Kitchen atorvastatin (LIPITOR) 20 MG tablet Take 20 mg by mouth daily.  . benazepril (LOTENSIN) 40 MG tablet Take 40 mg by mouth daily.  Marland Kitchen buPROPion (WELLBUTRIN SR) 150 MG 12 hr tablet Take 150 mg by mouth 2 (two) times daily.  . furosemide (LASIX) 20 MG tablet Take 20 mg by mouth.  . levothyroxine (SYNTHROID, LEVOTHROID) 112 MCG tablet Take 1 tablet (112 mcg total) by mouth daily before breakfast.  . meloxicam (MOBIC) 15 MG tablet   . metFORMIN (GLUCOPHAGE) 500 MG tablet TAKE 1 TABLET DAILY  . omeprazole (PRILOSEC) 10 MG capsule Take 10 mg by mouth daily.  . potassium chloride (K-DUR) 10 MEQ tablet Take 10 mEq by mouth daily.  . Vitamin D, Cholecalciferol, 400 UNITS TABS Take by mouth.  . [DISCONTINUED] levothyroxine (SYNTHROID, LEVOTHROID) 100 MCG tablet Take 1 tablet (100 mcg total) by mouth daily before breakfast.   No facility-administered encounter medications on file as of 09/05/2016.    ALLERGIES: Allergies  Allergen Reactions  . Flagyl [Metronidazole]    VACCINATION STATUS:  There is no immunization history on file for this patient.  HPI  71 yr old female with hypothyroidism, prediabetes,  MNG. she returns  for f/u of same.  She is currently on Levothyroxine 100 mcg by mouth every morning, metformin 500mg  po qday. She continues to improve. She has no new complaints. She reports compliance to her medications.   She  continues to gain weight. denies cold/heat intolerance.   She feels better. she denies dysphagia, SOB, voice change.   Review of Systems   Constitutional: + Has progressive weight gain , no fatigue, no subjective hyperthermia/hypothermia Eyes: no blurry vision, no xerophthalmia, + xerostomia  ENT: no sore throat, no nodules palpated in throat, no dysphagia/odynophagia, no hoarseness Cardiovascular: no CP/SOB/palpitations/leg swelling Respiratory: no cough/SOB Gastrointestinal: no N/V/D/C Musculoskeletal: no muscle/joint aches Skin: no rashes Neurological: no tremors/numbness/tingling/dizziness Psychiatric: no depression/anxiety  Objective:    BP 139/72   Pulse 82   Ht 5\' 7"  (1.702 m)   Wt 235 lb (106.6 kg)   BMI 36.81 kg/m   Wt Readings from Last 3 Encounters:  09/05/16 235 lb (106.6 kg)  02/16/16 228 lb (103.4 kg)  08/11/15 214 lb 6 oz (97.2 kg)    Physical Exam  Constitutional: obese, in NAD Eyes: PERRLA, EOMI, no exophthalmos ENT: moist mucous membranes, no thyromegaly, no cervical lymphadenopathy Cardiovascular: RRR, No MRG Respiratory: CTA B Gastrointestinal: abdomen soft, NT, ND, BS+ Musculoskeletal: no deformities, strength intact in all 4 Skin: moist, warm, no rashes Neurological: no  tremor with outstretched hands, DTR normal in all 4  Recent Results (from the past 2160 hour(s))  TSH     Status: None   Collection Time: 08/09/16  4:08 PM  Result Value Ref Range   TSH 4.48 mIU/L    Comment:   Reference Range   > or = 20 Years  0.40-4.50   Pregnancy Range First trimester  0.26-2.66 Second trimester 0.55-2.73 Third trimester  0.43-2.91     T4, free     Status: None   Collection Time: 08/09/16  4:08 PM  Result Value Ref Range   Free T4 1.2 0.8  - 1.8 ng/dL     Assessment & Plan:   1. Other specified hypothyroidism TFTs are Consistent with under replacement with thyroid hormone. - I will increase her levothyroxine to 112 g by mouth every morning.  - We discussed about correct intake of levothyroxine, at fasting, with water, separated by at least 30 minutes from breakfast, and separated by more than 4 hours from calcium, iron, multivitamins, acid reflux medications (PPIs). -Patient is made aware of the fact that thyroid hormone replacement is needed for life, dose to be adjusted by periodic monitoring of thyroid function tests. Her thyroid ultrasound is s/f stable nodules .  2. Pre-diabetes   She has controlled prediabetes with A1c of  5.9%,  I advised her to continue metformin  500mg  po daily. I recounseled her with CHO restriction and regular exercise.  I gave her dietary advise.   3) Multinodular goiter : - She will need surveillance repeat thyroid ultrasound.   - I advised patient to maintain close follow up with , MD for primary care needs. Follow up plan: Return in about 6 months (around 03/07/2017) for Thyroid / Neck Ultrasound, follow up with pre-visit labs.  03/09/2017, MD Phone: [phone]  Fax: [phone]  This note was partially dictated with voice recognition software. Similar sounding words can be transcribed inadequately or may not  be corrected upon review.  09/05/2016, 3:38 PM

## 2016-10-11 DIAGNOSIS — Z7982 Long term (current) use of aspirin: Secondary | ICD-10-CM | POA: Diagnosis not present

## 2016-10-11 DIAGNOSIS — I10 Essential (primary) hypertension: Secondary | ICD-10-CM | POA: Diagnosis not present

## 2016-10-11 DIAGNOSIS — R110 Nausea: Secondary | ICD-10-CM | POA: Diagnosis not present

## 2016-10-11 DIAGNOSIS — M25512 Pain in left shoulder: Secondary | ICD-10-CM | POA: Diagnosis not present

## 2016-10-11 DIAGNOSIS — J449 Chronic obstructive pulmonary disease, unspecified: Secondary | ICD-10-CM | POA: Diagnosis not present

## 2016-10-11 DIAGNOSIS — R609 Edema, unspecified: Secondary | ICD-10-CM | POA: Diagnosis not present

## 2016-10-11 DIAGNOSIS — M79602 Pain in left arm: Secondary | ICD-10-CM | POA: Diagnosis not present

## 2016-10-11 DIAGNOSIS — M542 Cervicalgia: Secondary | ICD-10-CM | POA: Diagnosis not present

## 2016-10-11 DIAGNOSIS — Z888 Allergy status to other drugs, medicaments and biological substances status: Secondary | ICD-10-CM | POA: Diagnosis not present

## 2016-10-11 DIAGNOSIS — Z79899 Other long term (current) drug therapy: Secondary | ICD-10-CM | POA: Diagnosis not present

## 2016-10-11 DIAGNOSIS — E039 Hypothyroidism, unspecified: Secondary | ICD-10-CM | POA: Diagnosis not present

## 2016-10-11 DIAGNOSIS — I739 Peripheral vascular disease, unspecified: Secondary | ICD-10-CM | POA: Diagnosis not present

## 2016-10-11 DIAGNOSIS — R0602 Shortness of breath: Secondary | ICD-10-CM | POA: Diagnosis not present

## 2016-10-11 DIAGNOSIS — K219 Gastro-esophageal reflux disease without esophagitis: Secondary | ICD-10-CM | POA: Diagnosis not present

## 2016-10-11 DIAGNOSIS — Z8249 Family history of ischemic heart disease and other diseases of the circulatory system: Secondary | ICD-10-CM | POA: Diagnosis not present

## 2016-10-11 DIAGNOSIS — E7800 Pure hypercholesterolemia, unspecified: Secondary | ICD-10-CM | POA: Diagnosis not present

## 2016-10-11 DIAGNOSIS — Z87891 Personal history of nicotine dependence: Secondary | ICD-10-CM | POA: Diagnosis not present

## 2016-10-11 DIAGNOSIS — Z23 Encounter for immunization: Secondary | ICD-10-CM | POA: Diagnosis not present

## 2016-10-11 DIAGNOSIS — R079 Chest pain, unspecified: Secondary | ICD-10-CM | POA: Diagnosis not present

## 2016-10-12 ENCOUNTER — Encounter: Payer: Self-pay | Admitting: Cardiology

## 2016-10-12 DIAGNOSIS — R609 Edema, unspecified: Secondary | ICD-10-CM | POA: Diagnosis not present

## 2016-10-12 DIAGNOSIS — M79602 Pain in left arm: Secondary | ICD-10-CM | POA: Diagnosis not present

## 2016-10-12 DIAGNOSIS — I10 Essential (primary) hypertension: Secondary | ICD-10-CM | POA: Diagnosis not present

## 2016-10-12 DIAGNOSIS — M79603 Pain in arm, unspecified: Secondary | ICD-10-CM | POA: Diagnosis not present

## 2016-10-12 DIAGNOSIS — R0602 Shortness of breath: Secondary | ICD-10-CM | POA: Diagnosis not present

## 2016-10-12 DIAGNOSIS — R079 Chest pain, unspecified: Secondary | ICD-10-CM | POA: Diagnosis not present

## 2016-10-12 DIAGNOSIS — Z23 Encounter for immunization: Secondary | ICD-10-CM | POA: Diagnosis not present

## 2016-10-13 DIAGNOSIS — R609 Edema, unspecified: Secondary | ICD-10-CM | POA: Diagnosis not present

## 2016-10-13 DIAGNOSIS — M79602 Pain in left arm: Secondary | ICD-10-CM | POA: Diagnosis not present

## 2016-10-13 DIAGNOSIS — Z23 Encounter for immunization: Secondary | ICD-10-CM | POA: Diagnosis not present

## 2016-10-13 DIAGNOSIS — R0602 Shortness of breath: Secondary | ICD-10-CM | POA: Diagnosis not present

## 2016-10-13 DIAGNOSIS — I10 Essential (primary) hypertension: Secondary | ICD-10-CM | POA: Diagnosis not present

## 2016-10-13 DIAGNOSIS — R079 Chest pain, unspecified: Secondary | ICD-10-CM | POA: Diagnosis not present

## 2016-10-14 DIAGNOSIS — R609 Edema, unspecified: Secondary | ICD-10-CM | POA: Diagnosis not present

## 2016-10-14 DIAGNOSIS — R0602 Shortness of breath: Secondary | ICD-10-CM | POA: Diagnosis not present

## 2016-10-14 DIAGNOSIS — R079 Chest pain, unspecified: Secondary | ICD-10-CM | POA: Diagnosis not present

## 2016-10-14 DIAGNOSIS — Z23 Encounter for immunization: Secondary | ICD-10-CM | POA: Diagnosis not present

## 2016-10-14 DIAGNOSIS — M79602 Pain in left arm: Secondary | ICD-10-CM | POA: Diagnosis not present

## 2016-10-14 DIAGNOSIS — I10 Essential (primary) hypertension: Secondary | ICD-10-CM | POA: Diagnosis not present

## 2016-10-17 DIAGNOSIS — R079 Chest pain, unspecified: Secondary | ICD-10-CM | POA: Diagnosis not present

## 2016-10-17 DIAGNOSIS — R0602 Shortness of breath: Secondary | ICD-10-CM | POA: Diagnosis not present

## 2016-10-19 DIAGNOSIS — R0602 Shortness of breath: Secondary | ICD-10-CM | POA: Diagnosis not present

## 2016-10-19 DIAGNOSIS — Z299 Encounter for prophylactic measures, unspecified: Secondary | ICD-10-CM | POA: Diagnosis not present

## 2016-10-19 DIAGNOSIS — Z87891 Personal history of nicotine dependence: Secondary | ICD-10-CM | POA: Diagnosis not present

## 2016-10-19 DIAGNOSIS — J449 Chronic obstructive pulmonary disease, unspecified: Secondary | ICD-10-CM | POA: Diagnosis not present

## 2016-10-19 DIAGNOSIS — I739 Peripheral vascular disease, unspecified: Secondary | ICD-10-CM | POA: Diagnosis not present

## 2016-10-19 DIAGNOSIS — I10 Essential (primary) hypertension: Secondary | ICD-10-CM | POA: Diagnosis not present

## 2016-10-31 ENCOUNTER — Encounter: Payer: Self-pay | Admitting: *Deleted

## 2016-10-31 ENCOUNTER — Encounter: Payer: Self-pay | Admitting: Cardiology

## 2016-10-31 NOTE — Progress Notes (Signed)
Cardiology Office Note  Date: 11/05/2016   ID: Dana Lawson, DOB 09/05/1945, MRN [id number]  PCP: Kirstie Peri, MD  Consulting Cardiologist: Nona Dell, MD   Chief Complaint  Patient presents with  . Abnormal Myoview  . Shortness of Breath    History of Present Illness: Dana Lawson is a 71 y.o. female referred for cardiology consultation by Dr. Sherryll Burger for evaluation following recent abnormal stress test.  She states that approximately 1 month ago she began to experience a vague left upper chest and arm discomfort, no definite precipitant.  Reports moderate intensity symptoms, no definite alleviating factor.  Also reporting more significant shortness of breath with usual activities.  She has a longer standing history of chronic intermittent leg swelling, no orthopnea or PND.  She was referred to [facility] by her PCP, was admitted for 3 days for IV diuresis and scheduled for an outpatient stress test.  She underwent a Steffanie Dunn at [facility] on October 10. There were no reported ST segment abnormalities. Radiology interpretation indicates minimal to mild reversible defect in the inferior apical myocardium suggesting possible small ischemic territory, LVEF 72%.  I discussed the results with her today.  Her current cardiac risk factors include type 2 diabetes mellitus, hyperlipidemia, hypertension, and family history of CAD in her mother.  I reviewed her medications which are outlined below.  She states that blood pressure was not well controlled when she was hospitalized earlier in the month, trend has been better following adjustments.  She reports compliance with her therapy.  Past Medical History:  Diagnosis Date  . Anxiety   . COPD (chronic obstructive pulmonary disease) (HCC)   . Depression   . Essential hypertension   . GERD (gastroesophageal reflux disease)   . Hepatic steatosis   . Hyperlipidemia   . Hypothyroidism   . Type 2  diabetes mellitus (HCC)     Past Surgical History:  Procedure Laterality Date  . TUBAL LIGATION      Current Outpatient Prescriptions  Medication Sig Dispense Refill  . aspirin 81 MG tablet Take 81 mg by mouth daily.    . benazepril (LOTENSIN) 40 MG tablet Take 40 mg by mouth daily.    Marland Kitchen buPROPion (WELLBUTRIN SR) 150 MG 12 hr tablet Take 150 mg by mouth 2 (two) times daily.    . clonazePAM (KLONOPIN) 0.5 MG tablet Take 0.5 mg by mouth 2 (two) times daily as needed for anxiety.    . furosemide (LASIX) 20 MG tablet Take 40 mg by mouth daily.     Marland Kitchen levothyroxine (SYNTHROID, LEVOTHROID) 100 MCG tablet Take 100 mcg by mouth daily before breakfast.    . metFORMIN (GLUCOPHAGE) 500 MG tablet TAKE 1 TABLET DAILY 90 tablet 0  . metoprolol tartrate (LOPRESSOR) 50 MG tablet Take 50 mg by mouth 2 (two) times daily.    Marland Kitchen omeprazole (PRILOSEC) 20 MG capsule Take 20 mg by mouth daily.    . potassium chloride (K-DUR) 10 MEQ tablet Take 10 mEq by mouth daily.    . rosuvastatin (CRESTOR) 20 MG tablet Take 20 mg by mouth daily.    . Vitamin D, Cholecalciferol, 400 UNITS TABS Take by mouth.     No current facility-administered medications for this visit.    Allergies:  Flagyl [metronidazole]   Social History: The patient  reports that she quit smoking about a year ago. Her smoking use included Cigarettes. She has never used smokeless tobacco. She reports that  she drinks alcohol. She reports that she does not use drugs.   Family History: The patient's family history includes COPD in her father; Heart attack in her mother; Stroke in her mother.   ROS:  Please see the history of present illness. Otherwise, complete review of systems is positive for none.  All other systems are reviewed and negative.   Physical Exam: VS:  BP 130/62   Pulse (!) 51   Ht 5' 7" (1.702 m)   Wt 230 lb (104.3 kg)   SpO2 97%   BMI 36.02 kg/m , BMI Body mass index is 36.02 kg/m.  Wt Readings from Last 3 Encounters:    11/05/16 230 lb (104.3 kg)  09/05/16 235 lb (106.6 kg)  02/16/16 228 lb (103.4 kg)    General: Obese woman, appears comfortable at rest. HEENT: Conjunctiva and lids normal, oropharynx clear. Neck: Supple, no elevated JVP or carotid bruits, no thyromegaly. Lungs: Clear to auscultation, nonlabored breathing at rest. Cardiac: Regular rate and rhythm, no S3, soft systolic murmur, no pericardial rub. Abdomen: Soft, nontender, bowel sounds present, no guarding or rebound. Extremities: No pitting edema, distal pulses 2+. Skin: Warm and dry. Musculoskeletal: No kyphosis. Neuropsychiatric: Alert and oriented x3, affect grossly appropriate.  ECG: I personally reviewed the tracing from October 2018 which shows sinus rhythm with nonspecific ST changes and decreased anterior R-wave progression.  Recent Labwork: 02/09/2016: ALT 40; AST 37; BUN 18; Creat 1.13; Potassium 4.4; Sodium 141 08/09/2016: TSH 4.48   Assessment and Plan:  1.  Dyspnea on exertion and intermittent left upper chest and arm discomfort as described above.  Patient states that her symptoms are less prominent than earlier in the month.  Cardiac risk factor profile includes type 2 diabetes mellitus, hypertension, hyperlipidemia, and family history of CAD in her mother.  She underwent a recent Lexiscan Myoview at [facility] which was mildly abnormal suggesting a small inferior ischemic territory.  After discussion, plan is to proceed with a diagnostic cardiac catheterization to clearly define coronary anatomy and assess for any revascularization options.  She is in agreement to proceed.  2.  Essential hypertension, currently on Lotensin and Lopressor.  3.  Hyperlipidemia, on Crestor.  She follows with Dr. Shah.  4.  Hypothyroidism, on Synthroid.  Recent TSH normal range.  5.  Type 2 diabetes mellitus, on Glucophage.  She follows with endocrinology.  Last hemoglobin A1c 5.9.  Current medicines were reviewed with the  patient today.  Disposition: Follow-up after cardiac catheterization.  Signed, Samuel G. McDowell, MD, FACC 11/05/2016 2:29 PM    [facility] [place] [address], [place], [place] [address] Phone: [phone]; Fax: [phone] 

## 2016-11-05 ENCOUNTER — Ambulatory Visit (INDEPENDENT_AMBULATORY_CARE_PROVIDER_SITE_OTHER): Payer: Medicare Other | Admitting: Cardiology

## 2016-11-05 ENCOUNTER — Encounter: Payer: Self-pay | Admitting: Cardiology

## 2016-11-05 ENCOUNTER — Other Ambulatory Visit: Payer: Self-pay | Admitting: Cardiology

## 2016-11-05 ENCOUNTER — Encounter (INDEPENDENT_AMBULATORY_CARE_PROVIDER_SITE_OTHER): Payer: Self-pay

## 2016-11-05 ENCOUNTER — Telehealth: Payer: Self-pay | Admitting: Cardiology

## 2016-11-05 VITALS — BP 130/62 | HR 51 | Ht 67.0 in | Wt 230.0 lb

## 2016-11-05 DIAGNOSIS — E039 Hypothyroidism, unspecified: Secondary | ICD-10-CM | POA: Diagnosis not present

## 2016-11-05 DIAGNOSIS — E782 Mixed hyperlipidemia: Secondary | ICD-10-CM

## 2016-11-05 DIAGNOSIS — R0609 Other forms of dyspnea: Secondary | ICD-10-CM | POA: Diagnosis not present

## 2016-11-05 DIAGNOSIS — R9439 Abnormal result of other cardiovascular function study: Secondary | ICD-10-CM

## 2016-11-05 DIAGNOSIS — I10 Essential (primary) hypertension: Secondary | ICD-10-CM

## 2016-11-05 DIAGNOSIS — E119 Type 2 diabetes mellitus without complications: Secondary | ICD-10-CM

## 2016-11-05 NOTE — Telephone Encounter (Signed)
L heart cath on 11/1 at 12:00

## 2016-11-05 NOTE — Patient Instructions (Signed)
Medication Instructions:  Your physician recommends that you continue on your current medications as directed. Please refer to the Current Medication list given to you today.  Labwork: NONE  Testing/Procedures:  You are scheduled for a Cardiac Catheterization on Thursday, November 1 with Dr. Bryan Lemma.  1. Please arrive at the [facility] (Main Entrance A) at [facility]: [address] [place], [place] [address] at 10:00 AM (two hours before your procedure to ensure your preparation). Free valet parking service is available.   Special note: Every effort is made to have your procedure done on time. Please understand that emergencies sometimes delay scheduled procedures.  2. Diet: Do not eat or drink anything after midnight prior to your procedure except sips of water to take medications.  3. Labs: Your labs will be performed at the hospital after you arrive for your procedure.  4. Medication instructions in preparation for your procedure:  Glucophage (Metformin) on Wednesday, October 31. Hold metformin as well 48 hours after procedure  On the morning of your procedure, take your Aspirin and any morning medicines NOT listed above.  You may use sips of water.  5. Plan for one night stay--bring personal belongings. 6. Bring a current list of your medications and current insurance cards. 7. You MUST have a responsible person to drive you home. 8. Someone MUST be with you the first 24 hours after you arrive home or your discharge will be delayed. 9. Please wear clothes that are easy to get on and off and wear slip-on shoes.  Thank you for allowing [place] to care for you!   -- [facility]  Follow-Up: Your physician recommends that you schedule a follow-up appointment in: 1 MONTH WITH DR. MCDOWELL  Any Other Special Instructions Will Be Listed Below (If Applicable).  If you need a refill on your cardiac medications before your next appointment,  please call your pharmacy.

## 2016-11-06 ENCOUNTER — Telehealth: Payer: Self-pay

## 2016-11-06 NOTE — Telephone Encounter (Signed)
Patient contacted pre-catheterization at [facility] scheduled for:  11/08/2016 @ 1200 Verified arrival time and place:  NT @ 1000 Confirmed AM meds to be taken pre-cath with sip of water: Take ASA Hold metformin 10/31-post procedure as directed Hold lasix am of Confirmed patient has responsible person to drive home post procedure and observe patient for 24 hours:  yes Addl concerns:  none

## 2016-11-08 ENCOUNTER — Ambulatory Visit (HOSPITAL_COMMUNITY)
Admission: RE | Admit: 2016-11-08 | Discharge: 2016-11-08 | Disposition: A | Discharge status: Home / Self Care | Payer: Medicare Other | Source: Ambulatory Visit | Attending: Cardiology | Admitting: Cardiology

## 2016-11-08 ENCOUNTER — Encounter (HOSPITAL_COMMUNITY)
Admission: RE | Disposition: A | Discharge status: Home / Self Care | Payer: Self-pay | Source: Ambulatory Visit | Attending: Cardiology

## 2016-11-08 DIAGNOSIS — R9439 Abnormal result of other cardiovascular function study: Secondary | ICD-10-CM

## 2016-11-08 DIAGNOSIS — J449 Chronic obstructive pulmonary disease, unspecified: Secondary | ICD-10-CM | POA: Insufficient documentation

## 2016-11-08 DIAGNOSIS — F329 Major depressive disorder, single episode, unspecified: Secondary | ICD-10-CM | POA: Insufficient documentation

## 2016-11-08 DIAGNOSIS — K219 Gastro-esophageal reflux disease without esophagitis: Secondary | ICD-10-CM | POA: Insufficient documentation

## 2016-11-08 DIAGNOSIS — I10 Essential (primary) hypertension: Secondary | ICD-10-CM | POA: Diagnosis not present

## 2016-11-08 DIAGNOSIS — Z7982 Long term (current) use of aspirin: Secondary | ICD-10-CM | POA: Insufficient documentation

## 2016-11-08 DIAGNOSIS — Z87891 Personal history of nicotine dependence: Secondary | ICD-10-CM | POA: Insufficient documentation

## 2016-11-08 DIAGNOSIS — F419 Anxiety disorder, unspecified: Secondary | ICD-10-CM | POA: Insufficient documentation

## 2016-11-08 DIAGNOSIS — Z7984 Long term (current) use of oral hypoglycemic drugs: Secondary | ICD-10-CM | POA: Insufficient documentation

## 2016-11-08 DIAGNOSIS — I2510 Atherosclerotic heart disease of native coronary artery without angina pectoris: Secondary | ICD-10-CM | POA: Insufficient documentation

## 2016-11-08 DIAGNOSIS — Z8249 Family history of ischemic heart disease and other diseases of the circulatory system: Secondary | ICD-10-CM | POA: Insufficient documentation

## 2016-11-08 DIAGNOSIS — E039 Hypothyroidism, unspecified: Secondary | ICD-10-CM | POA: Insufficient documentation

## 2016-11-08 DIAGNOSIS — E785 Hyperlipidemia, unspecified: Secondary | ICD-10-CM | POA: Insufficient documentation

## 2016-11-08 DIAGNOSIS — E119 Type 2 diabetes mellitus without complications: Secondary | ICD-10-CM | POA: Diagnosis not present

## 2016-11-08 HISTORY — PX: LEFT HEART CATH AND CORONARY ANGIOGRAPHY: CATH118249

## 2016-11-08 LAB — BASIC METABOLIC PANEL
Anion gap: 11 (ref 5–15)
BUN: 14 mg/dL (ref 6–20)
CHLORIDE: 102 mmol/L (ref 101–111)
CO2: 27 mmol/L (ref 22–32)
CREATININE: 1.12 mg/dL — AB (ref 0.44–1.00)
Calcium: 9.4 mg/dL (ref 8.9–10.3)
GFR, EST AFRICAN AMERICAN: 57 mL/min — AB (ref >60)
GFR, EST NON AFRICAN AMERICAN: 49 mL/min — AB (ref >60)
Glucose, Bld: 139 mg/dL — ABNORMAL HIGH (ref 65–99)
Potassium: 4.7 mmol/L (ref 3.5–5.1)
SODIUM: 140 mmol/L (ref 135–145)

## 2016-11-08 LAB — PROTIME-INR
INR: 1.04
PROTHROMBIN TIME: 13.5 s (ref 11.4–15.2)

## 2016-11-08 LAB — GLUCOSE, CAPILLARY: GLUCOSE-CAPILLARY: 135 mg/dL — AB (ref 65–99)

## 2016-11-08 LAB — CBC
HEMATOCRIT: 40.8 % (ref 36.0–46.0)
HEMOGLOBIN: 13.2 g/dL (ref 12.0–15.0)
MCH: 31.5 pg (ref 26.0–34.0)
MCHC: 32.4 g/dL (ref 30.0–36.0)
MCV: 97.4 fL (ref 78.0–100.0)
Platelets: 174 10*3/uL (ref 150–400)
RBC: 4.19 MIL/uL (ref 3.87–5.11)
RDW: 13 % (ref 11.5–15.5)
WBC: 7.5 10*3/uL (ref 4.0–10.5)

## 2016-11-08 SURGERY — LEFT HEART CATH AND CORONARY ANGIOGRAPHY
Anesthesia: LOCAL

## 2016-11-08 MED ORDER — MIDAZOLAM HCL 2 MG/2ML IJ SOLN
INTRAMUSCULAR | Status: AC
  Filled 2016-11-08: qty 2

## 2016-11-08 MED ORDER — VERAPAMIL HCL 2.5 MG/ML IV SOLN
INTRAVENOUS | Status: DC | PRN
  Administered 2016-11-08: 10 mL via INTRA_ARTERIAL

## 2016-11-08 MED ORDER — LIDOCAINE HCL (PF) 1 % IJ SOLN
INTRAMUSCULAR | Status: AC
  Filled 2016-11-08: qty 30

## 2016-11-08 MED ORDER — HEPARIN SODIUM (PORCINE) 1000 UNIT/ML IJ SOLN
INTRAMUSCULAR | Status: AC
  Filled 2016-11-08: qty 1

## 2016-11-08 MED ORDER — METOPROLOL TARTRATE 50 MG PO TABS
50.0000 mg | ORAL_TABLET | Freq: Two times a day (BID) | ORAL | Status: DC
  Administered 2016-11-08: 50 mg via ORAL

## 2016-11-08 MED ORDER — ASPIRIN 81 MG PO CHEW: 81.0000 mg | CHEWABLE_TABLET | ORAL | Status: DC

## 2016-11-08 MED ORDER — SODIUM CHLORIDE 0.9% FLUSH: 3.0000 mL | Freq: Two times a day (BID) | INTRAVENOUS | Status: DC

## 2016-11-08 MED ORDER — LIDOCAINE HCL (PF) 1 % IJ SOLN
INTRAMUSCULAR | Status: DC | PRN
  Administered 2016-11-08: 4 mL via INTRADERMAL

## 2016-11-08 MED ORDER — SODIUM CHLORIDE 0.9% FLUSH: 3.0000 mL | INTRAVENOUS | Status: DC | PRN

## 2016-11-08 MED ORDER — SODIUM CHLORIDE 0.9 % WEIGHT BASED INFUSION
3.0000 mL/kg/h | INTRAVENOUS | Status: AC
  Administered 2016-11-08: 3 mL/kg/h via INTRAVENOUS

## 2016-11-08 MED ORDER — HEPARIN SODIUM (PORCINE) 1000 UNIT/ML IJ SOLN
INTRAMUSCULAR | Status: DC | PRN
  Administered 2016-11-08: 5000 [IU] via INTRAVENOUS

## 2016-11-08 MED ORDER — MIDAZOLAM HCL 2 MG/2ML IJ SOLN
INTRAMUSCULAR | Status: DC | PRN
  Administered 2016-11-08: 2 mg via INTRAVENOUS
  Administered 2016-11-08: 1 mg via INTRAVENOUS

## 2016-11-08 MED ORDER — VERAPAMIL HCL 2.5 MG/ML IV SOLN
INTRAVENOUS | Status: AC
  Filled 2016-11-08: qty 2

## 2016-11-08 MED ORDER — ONDANSETRON HCL 4 MG/2ML IJ SOLN: 4.0000 mg | Freq: Four times a day (QID) | INTRAMUSCULAR | Status: DC | PRN

## 2016-11-08 MED ORDER — ACETAMINOPHEN 325 MG PO TABS: 650.0000 mg | ORAL_TABLET | ORAL | Status: DC | PRN

## 2016-11-08 MED ORDER — MORPHINE SULFATE (PF) 4 MG/ML IV SOLN: 1.0000 mg | INTRAVENOUS | Status: DC | PRN

## 2016-11-08 MED ORDER — IOPAMIDOL (ISOVUE-370) INJECTION 76%
INTRAVENOUS | Status: AC
  Filled 2016-11-08: qty 100

## 2016-11-08 MED ORDER — FENTANYL CITRATE (PF) 100 MCG/2ML IJ SOLN
INTRAMUSCULAR | Status: AC
  Filled 2016-11-08: qty 2

## 2016-11-08 MED ORDER — IOPAMIDOL (ISOVUE-370) INJECTION 76%
INTRAVENOUS | Status: DC | PRN
  Administered 2016-11-08: 60 mL via INTRA_ARTERIAL

## 2016-11-08 MED ORDER — FENTANYL CITRATE (PF) 100 MCG/2ML IJ SOLN
INTRAMUSCULAR | Status: DC | PRN
  Administered 2016-11-08 (×2): 25 ug via INTRAVENOUS

## 2016-11-08 MED ORDER — FUROSEMIDE 40 MG PO TABS
40.0000 mg | ORAL_TABLET | Freq: Every day | ORAL | Status: DC
  Administered 2016-11-08: 40 mg via ORAL
  Filled 2016-11-08: qty 1

## 2016-11-08 MED ORDER — SODIUM CHLORIDE 0.9 % WEIGHT BASED INFUSION: 1.0000 mL/kg/h | INTRAVENOUS | Status: DC

## 2016-11-08 MED ORDER — HEPARIN (PORCINE) IN NACL 2-0.9 UNIT/ML-% IJ SOLN
INTRAMUSCULAR | Status: AC
  Filled 2016-11-08: qty 500

## 2016-11-08 MED ORDER — SODIUM CHLORIDE 0.9 % IV SOLN: 250.0000 mL | INTRAVENOUS | Status: DC | PRN

## 2016-11-08 MED ORDER — METOPROLOL TARTRATE 12.5 MG HALF TABLET
ORAL_TABLET | ORAL | Status: AC
  Filled 2016-11-08: qty 4

## 2016-11-08 MED ORDER — SODIUM CHLORIDE 0.9 % IV SOLN: INTRAVENOUS | Status: AC

## 2016-11-08 MED ORDER — HEPARIN (PORCINE) IN NACL 2-0.9 UNIT/ML-% IJ SOLN
INTRAMUSCULAR | Status: AC | PRN
  Administered 2016-11-08: 1000 mL via INTRA_ARTERIAL

## 2016-11-08 SURGICAL SUPPLY — 9 items
CATH OPTITORQUE TIG 4.0 5F (CATHETERS) ×1 IMPLANT
DEVICE RAD COMP TR BAND LRG (VASCULAR PRODUCTS) ×1 IMPLANT
GLIDESHEATH SLEND A-KIT 6F 22G (SHEATH) ×1 IMPLANT
GUIDEWIRE INQWIRE 1.5J.035X260 (WIRE) IMPLANT
INQWIRE 1.5J .035X260CM (WIRE) ×2
KIT HEART LEFT (KITS) ×2 IMPLANT
PACK CARDIAC CATHETERIZATION (CUSTOM PROCEDURE TRAY) ×2 IMPLANT
TRANSDUCER W/STOPCOCK (MISCELLANEOUS) ×2 IMPLANT
TUBING CIL FLEX 10 FLL-RA (TUBING) ×2 IMPLANT

## 2016-11-08 NOTE — Interval H&P Note (Signed)
History and Physical Interval Note:  11/08/2016 12:10 PM  Dana Lawson  has presented today for surgery, with the diagnosis of abnormal stress test, sob.   The various methods of treatment have been discussed with the patient and family. After consideration of risks, benefits and other options for treatment, the patient has consented to  Procedure(s): LEFT HEART CATH AND CORONARY ANGIOGRAPHY (N/A) with possible PERCUTANEOUS CORONARY INTERVENTION as a surgical intervention .  The patient's history has been reviewed, patient examined, no change in status, stable for surgery.  I have reviewed the patient's chart and labs.  Questions were answered to the patient's satisfaction.     Cath Lab Visit (complete for each Cath Lab visit)  Clinical Evaluation Leading to the Procedure:   ACS: No.  Non-ACS:    Anginal Classification: CCS II  Anti-ischemic medical therapy: Minimal Therapy (1 class of medications)  Non-Invasive Test Results: Intermediate-risk stress test findings: cardiac mortality 1-3%/year -actual risk level was not documented -per referring cardiologist, clinically immediate risk...  Prior CABG: No previous CABG   Bryan Lemma

## 2016-11-08 NOTE — Discharge Instructions (Signed)
HOLD METFORMIN UNTIL SUNDAY 11/11/2016  Radial Site Care Refer to this sheet in the next few weeks. These instructions provide you with information about caring for yourself after your procedure. Your health care provider may also give you more specific instructions. Your treatment has been planned according to current medical practices, but problems sometimes occur. Call your health care provider if you have any problems or questions after your procedure. What can I expect after the procedure? After your procedure, it is typical to have the following:  Bruising at the radial site that usually fades within 1-2 weeks.  Blood collecting in the tissue (hematoma) that may be painful to the touch. It should usually decrease in size and tenderness within 1-2 weeks.  Follow these instructions at home:  Take medicines only as directed by your health care provider.  You may shower 24-48 hours after the procedure or as directed by your health care provider. Remove the bandage (dressing) and gently wash the site with plain soap and water. Pat the area dry with a clean towel. Do not rub the site, because this may cause bleeding.  Do not take baths, swim, or use a hot tub until your health care provider approves.  Check your insertion site every day for redness, swelling, or drainage.  Do not apply powder or lotion to the site.  Do not flex or bend the affected arm for 24 hours or as directed by your health care provider.  Do not push or pull heavy objects with the affected arm for 24 hours or as directed by your health care provider.  Do not lift over 10 lb (4.5 kg) for 5 days after your procedure or as directed by your health care provider.  Ask your health care provider when it is okay to: ? Return to work or school. ? Resume usual physical activities or sports. ? Resume sexual activity.  Do not drive home if you are discharged the same day as the procedure. Have someone else drive  you.  You may drive 24 hours after the procedure unless otherwise instructed by your health care provider.  Do not operate machinery or power tools for 24 hours after the procedure.  If your procedure was done as an outpatient procedure, which means that you went home the same day as your procedure, a responsible adult should be with you for the first 24 hours after you arrive home.  Keep all follow-up visits as directed by your health care provider. This is important. Contact a health care provider if:  You have a fever.  You have chills.  You have increased bleeding from the radial site. Hold pressure on the site. Get help right away if:  You have unusual pain at the radial site.  You have redness, warmth, or swelling at the radial site.  You have drainage (other than a small amount of blood on the dressing) from the radial site.  The radial site is bleeding, and the bleeding does not stop after 30 minutes of holding steady pressure on the site.  Your arm or hand becomes pale, cool, tingly, or numb. This information is not intended to replace advice given to you by your health care provider. Make sure you discuss any questions you have with your health care provider. Document Released: 01/27/2010 Document Revised: 06/02/2015 Document Reviewed: 07/13/2013 Elsevier Interactive Patient Education  2018 [employer].

## 2016-11-08 NOTE — H&P (View-Only) (Signed)
Cardiology Office Note  Date: 11/05/2016   ID: ALISSA PHARR, DOB 09/05/1945, MRN [id number]  PCP: Kirstie Peri, MD  Consulting Cardiologist: Nona Dell, MD   Chief Complaint  Patient presents with  . Abnormal Myoview  . Shortness of Breath    History of Present Illness: Dana Lawson is a 71 y.o. female referred for cardiology consultation by Dr. Sherryll Burger for evaluation following recent abnormal stress test.  She states that approximately 1 month ago she began to experience a vague left upper chest and arm discomfort, no definite precipitant.  Reports moderate intensity symptoms, no definite alleviating factor.  Also reporting more significant shortness of breath with usual activities.  She has a longer standing history of chronic intermittent leg swelling, no orthopnea or PND.  She was referred to [facility] by her PCP, was admitted for 3 days for IV diuresis and scheduled for an outpatient stress test.  She underwent a Steffanie Dunn at [facility] on October 10. There were no reported ST segment abnormalities. Radiology interpretation indicates minimal to mild reversible defect in the inferior apical myocardium suggesting possible small ischemic territory, LVEF 72%.  I discussed the results with her today.  Her current cardiac risk factors include type 2 diabetes mellitus, hyperlipidemia, hypertension, and family history of CAD in her mother.  I reviewed her medications which are outlined below.  She states that blood pressure was not well controlled when she was hospitalized earlier in the month, trend has been better following adjustments.  She reports compliance with her therapy.  Past Medical History:  Diagnosis Date  . Anxiety   . COPD (chronic obstructive pulmonary disease) (HCC)   . Depression   . Essential hypertension   . GERD (gastroesophageal reflux disease)   . Hepatic steatosis   . Hyperlipidemia   . Hypothyroidism   . Type 2  diabetes mellitus (HCC)     Past Surgical History:  Procedure Laterality Date  . TUBAL LIGATION      Current Outpatient Prescriptions  Medication Sig Dispense Refill  . aspirin 81 MG tablet Take 81 mg by mouth daily.    . benazepril (LOTENSIN) 40 MG tablet Take 40 mg by mouth daily.    Marland Kitchen buPROPion (WELLBUTRIN SR) 150 MG 12 hr tablet Take 150 mg by mouth 2 (two) times daily.    . clonazePAM (KLONOPIN) 0.5 MG tablet Take 0.5 mg by mouth 2 (two) times daily as needed for anxiety.    . furosemide (LASIX) 20 MG tablet Take 40 mg by mouth daily.     Marland Kitchen levothyroxine (SYNTHROID, LEVOTHROID) 100 MCG tablet Take 100 mcg by mouth daily before breakfast.    . metFORMIN (GLUCOPHAGE) 500 MG tablet TAKE 1 TABLET DAILY 90 tablet 0  . metoprolol tartrate (LOPRESSOR) 50 MG tablet Take 50 mg by mouth 2 (two) times daily.    Marland Kitchen omeprazole (PRILOSEC) 20 MG capsule Take 20 mg by mouth daily.    . potassium chloride (K-DUR) 10 MEQ tablet Take 10 mEq by mouth daily.    . rosuvastatin (CRESTOR) 20 MG tablet Take 20 mg by mouth daily.    . Vitamin D, Cholecalciferol, 400 UNITS TABS Take by mouth.     No current facility-administered medications for this visit.    Allergies:  Flagyl [metronidazole]   Social History: The patient  reports that she quit smoking about a year ago. Her smoking use included Cigarettes. She has never used smokeless tobacco. She reports that  she drinks alcohol. She reports that she does not use drugs.   Family History: The patient's family history includes COPD in her father; Heart attack in her mother; Stroke in her mother.   ROS:  Please see the history of present illness. Otherwise, complete review of systems is positive for none.  All other systems are reviewed and negative.   Physical Exam: VS:  BP 130/62   Pulse (!) 51   Ht 5\' 7"  (1.702 m)   Wt 230 lb (104.3 kg)   SpO2 97%   BMI 36.02 kg/m , BMI Body mass index is 36.02 kg/m.  Wt Readings from Last 3 Encounters:    11/05/16 230 lb (104.3 kg)  09/05/16 235 lb (106.6 kg)  02/16/16 228 lb (103.4 kg)    General: Obese woman, appears comfortable at rest. HEENT: Conjunctiva and lids normal, oropharynx clear. Neck: Supple, no elevated JVP or carotid bruits, no thyromegaly. Lungs: Clear to auscultation, nonlabored breathing at rest. Cardiac: Regular rate and rhythm, no S3, soft systolic murmur, no pericardial rub. Abdomen: Soft, nontender, bowel sounds present, no guarding or rebound. Extremities: No pitting edema, distal pulses 2+. Skin: Warm and dry. Musculoskeletal: No kyphosis. Neuropsychiatric: Alert and oriented x3, affect grossly appropriate.  ECG: I personally reviewed the tracing from October 2018 which shows sinus rhythm with nonspecific ST changes and decreased anterior R-wave progression.  Recent Labwork: 02/09/2016: ALT 40; AST 37; BUN 18; Creat 1.13; Potassium 4.4; Sodium 141 08/09/2016: TSH 4.48   Assessment and Plan:  1.  Dyspnea on exertion and intermittent left upper chest and arm discomfort as described above.  Patient states that her symptoms are less prominent than earlier in the month.  Cardiac risk factor profile includes type 2 diabetes mellitus, hypertension, hyperlipidemia, and family history of CAD in her mother.  She underwent a recent Lexiscan Myoview at [facility] which was mildly abnormal suggesting a small inferior ischemic territory.  After discussion, plan is to proceed with a diagnostic cardiac catheterization to clearly define coronary anatomy and assess for any revascularization options.  She is in agreement to proceed.  2.  Essential hypertension, currently on Lotensin and Lopressor.  3.  Hyperlipidemia, on Crestor.  She follows with [facility]. [facility]  4.  Hypothyroidism, on Synthroid.  Recent TSH normal range.  5.  Type 2 diabetes mellitus, on Glucophage.  She follows with endocrinology.  Last hemoglobin A1c 5.9.  Current medicines were reviewed with the  patient today.  Disposition: Follow-up after cardiac catheterization.  Signed, Sherryll Burger, MD, [facility] 11/05/2016 2:29 PM    [facility] at [facility] [address] [place], [place], [place] Phone: [phone]; Fax: [phone]

## 2016-11-09 ENCOUNTER — Encounter (HOSPITAL_COMMUNITY): Payer: Self-pay | Admitting: Cardiology

## 2016-11-19 DIAGNOSIS — Z1211 Encounter for screening for malignant neoplasm of colon: Secondary | ICD-10-CM | POA: Diagnosis not present

## 2016-11-19 DIAGNOSIS — E1165 Type 2 diabetes mellitus with hyperglycemia: Secondary | ICD-10-CM | POA: Diagnosis not present

## 2016-11-19 DIAGNOSIS — E039 Hypothyroidism, unspecified: Secondary | ICD-10-CM | POA: Diagnosis not present

## 2016-11-19 DIAGNOSIS — E7800 Pure hypercholesterolemia, unspecified: Secondary | ICD-10-CM | POA: Diagnosis not present

## 2016-11-19 DIAGNOSIS — Z7189 Other specified counseling: Secondary | ICD-10-CM | POA: Diagnosis not present

## 2016-11-19 DIAGNOSIS — Z79899 Other long term (current) drug therapy: Secondary | ICD-10-CM | POA: Diagnosis not present

## 2016-11-19 DIAGNOSIS — Z0000 Encounter for general adult medical examination without abnormal findings: Secondary | ICD-10-CM | POA: Diagnosis not present

## 2016-11-19 DIAGNOSIS — Z299 Encounter for prophylactic measures, unspecified: Secondary | ICD-10-CM | POA: Diagnosis not present

## 2016-11-19 DIAGNOSIS — Z1331 Encounter for screening for depression: Secondary | ICD-10-CM | POA: Diagnosis not present

## 2016-11-19 DIAGNOSIS — Z6836 Body mass index (BMI) 36.0-36.9, adult: Secondary | ICD-10-CM | POA: Diagnosis not present

## 2016-11-19 DIAGNOSIS — Z1339 Encounter for screening examination for other mental health and behavioral disorders: Secondary | ICD-10-CM | POA: Diagnosis not present

## 2016-11-19 DIAGNOSIS — I739 Peripheral vascular disease, unspecified: Secondary | ICD-10-CM | POA: Diagnosis not present

## 2016-11-19 DIAGNOSIS — R5383 Other fatigue: Secondary | ICD-10-CM | POA: Diagnosis not present

## 2016-12-05 DIAGNOSIS — R922 Inconclusive mammogram: Secondary | ICD-10-CM | POA: Diagnosis not present

## 2016-12-05 DIAGNOSIS — N644 Mastodynia: Secondary | ICD-10-CM | POA: Diagnosis not present

## 2016-12-05 DIAGNOSIS — R928 Other abnormal and inconclusive findings on diagnostic imaging of breast: Secondary | ICD-10-CM | POA: Diagnosis not present

## 2016-12-07 DIAGNOSIS — N898 Other specified noninflammatory disorders of vagina: Secondary | ICD-10-CM | POA: Diagnosis not present

## 2016-12-07 DIAGNOSIS — R300 Dysuria: Secondary | ICD-10-CM | POA: Diagnosis not present

## 2016-12-11 ENCOUNTER — Encounter: Payer: Self-pay | Admitting: Cardiology

## 2016-12-11 ENCOUNTER — Ambulatory Visit (INDEPENDENT_AMBULATORY_CARE_PROVIDER_SITE_OTHER): Payer: Medicare Other | Admitting: Cardiology

## 2016-12-11 VITALS — BP 126/66 | HR 87 | Ht 68.0 in | Wt 236.0 lb

## 2016-12-11 DIAGNOSIS — M79604 Pain in right leg: Secondary | ICD-10-CM | POA: Diagnosis not present

## 2016-12-11 DIAGNOSIS — I209 Angina pectoris, unspecified: Secondary | ICD-10-CM

## 2016-12-11 DIAGNOSIS — I10 Essential (primary) hypertension: Secondary | ICD-10-CM

## 2016-12-11 DIAGNOSIS — I25119 Atherosclerotic heart disease of native coronary artery with unspecified angina pectoris: Secondary | ICD-10-CM

## 2016-12-11 DIAGNOSIS — M79605 Pain in left leg: Secondary | ICD-10-CM

## 2016-12-11 DIAGNOSIS — E782 Mixed hyperlipidemia: Secondary | ICD-10-CM

## 2016-12-11 NOTE — Progress Notes (Signed)
Cardiology Office Note  Date: 12/11/2016   ID: Dana Lawson, DOB [birth year], MRN [id number]  PCP: Monico Blitz, MD  Primary Cardiologist: Rozann Lesches, MD   Chief Complaint  Patient presents with  . Cardiac follow-up    History of Present Illness: Dana Lawson is a 71 y.o. female that I met in consultation back in October.  She had had undergone a Lexiscan Myoview at [facility] care which was mildly abnormal suggesting small ischemic territory in the inferior wall.  With risk factors including type 2 diabetes mellitus, hypertension, and hyperlipidemia as well as family history of CAD, she was referred for a cardiac catheterization.  Procedure was performed by Dr. Ellyn Hack on November 1 with findings of only minimal coronary atherosclerosis and elevated LVEDP in the setting of systemic hypertension.  She presents today for follow-up.  States that she has not had any recurrent chest pain and reports overall good blood pressure control.  Her systolic is in the 517O today.  Today we went over risk factor modification strategies.  She follows with endocrinology for management of diabetes mellitus.  I have recommended an exercise plan as well and weight loss.  She reports a more chronic problem with bilateral leg pain and intermittent swelling.  She has not had assessment for PAD.  Past Medical History:  Diagnosis Date  . Anxiety   . COPD (chronic obstructive pulmonary disease) ([facility])   . Depression   . Essential hypertension   . GERD (gastroesophageal reflux disease)   . Hepatic steatosis   . Hyperlipidemia   . Hypothyroidism   . Type 2 diabetes mellitus ([place])     Past Surgical History:  Procedure Laterality Date  . LEFT HEART CATH AND CORONARY ANGIOGRAPHY N/A 11/08/2016   Procedure: LEFT HEART CATH AND CORONARY ANGIOGRAPHY;  Surgeon: Leonie Man, MD;  Location: [place] [facility];  Service: Cardiovascular;  Laterality: N/A;  . TUBAL LIGATION      Current  Outpatient Medications  Medication Sig Dispense Refill  . aspirin 81 MG tablet Take 81 mg by mouth daily.    . benazepril (LOTENSIN) 40 MG tablet Take 40 mg by mouth daily.    Marland Kitchen buPROPion (WELLBUTRIN SR) 150 MG 12 hr tablet Take 150 mg by mouth 2 (two) times daily.    . clonazePAM (KLONOPIN) 0.5 MG tablet Take 0.5 mg by mouth 2 (two) times daily as needed for anxiety.    . furosemide (LASIX) 20 MG tablet Take 40 mg by mouth daily.     Marland Kitchen levothyroxine (SYNTHROID, LEVOTHROID) 100 MCG tablet Take 100 mcg by mouth daily before breakfast.    . metFORMIN (GLUCOPHAGE) 500 MG tablet Take 500 mg by mouth daily with breakfast.    . metoprolol tartrate (LOPRESSOR) 50 MG tablet Take 50 mg by mouth 2 (two) times daily.    Marland Kitchen omeprazole (PRILOSEC) 20 MG capsule Take 20 mg by mouth daily.    . potassium chloride (K-DUR) 10 MEQ tablet Take 10 mEq by mouth daily.    . rosuvastatin (CRESTOR) 20 MG tablet Take 20 mg by mouth daily.     No current facility-administered medications for this visit.    Allergies:  Flagyl [metronidazole]   Social History: The patient  reports that she quit smoking about 13 months ago. Her smoking use included cigarettes. she has never used smokeless tobacco. She reports that she drinks alcohol. She reports that she does not use drugs.   ROS:  Please see the  history of present illness. Otherwise, complete review of systems is positive for intermittent leg swelling and bilateral discomfort.  All other systems are reviewed and negative.   Physical Exam: VS:  BP 126/66   Pulse 87   Ht '5\' 8"'  (1.727 m)   Wt 236 lb (107 kg)   SpO2 94%   BMI 35.88 kg/m , BMI Body mass index is 35.88 kg/m.  Wt Readings from Last 3 Encounters:  12/11/16 236 lb (107 kg)  11/08/16 232 lb (105.2 kg)  11/05/16 230 lb (104.3 kg)    General: Obese woman, appears comfortable at rest. HEENT: Conjunctiva and lids normal, oropharynx clear. Neck: Supple, no elevated JVP or carotid bruits, no  thyromegaly. Lungs: Clear to auscultation, nonlabored breathing at rest. Cardiac: Regular rate and rhythm, no S3 or significant systolic murmur, no pericardial rub. Abdomen: Obese, nontender,  bowel sounds present. Extremities: Trace ankle edema, distal pulses 2+. Skin: Warm and dry. Musculoskeletal: No kyphosis. Neuropsychiatric: Alert and oriented x3, affect grossly appropriate.  ECG: I personally reviewed the tracing from 11/08/2016 which showed sinus rhythm with low voltage.  Recent Labwork: 02/09/2016: ALT 40; AST 37 08/09/2016: TSH 4.48 11/08/2016: BUN 14; Creatinine, Ser 1.12; Hemoglobin 13.2; Platelets 174; Potassium 4.7; Sodium 140   Other Studies Reviewed Today:  Cardiac catheterization 11/08/2016:  Minimal, nonobstructive CAD  There is hyperdynamic left ventricular systolic function. The left ventricular ejection fraction is greater than 65% by visual estimate.  LV end diastolic pressure is severely elevated.  Assessment and Plan:  1.  Minor coronary atherosclerosis by cardiac catheterization in November.  Would recommend risk factor modification strategies.  Continue aspirin and statin therapy.  Keep follow-up with Dr. Manuella Ghazi.  2.  Bilateral leg pain.  We will obtain lower extremity arterial Dopplers and ABIs to exclude obstructive PAD.  3.  Essential hypertension, systolic in the 626R today.  She is on Lotensin and Lopressor.  Keep follow-up with Dr. Manuella Ghazi.  4.  Mixed hyperlipidemia, continues on Crestor.  Keep follow-up with Dr. Manuella Ghazi.  Current medicines were reviewed with the patient today.  Disposition: Call with test results.  Signed, Satira Sark, MD, [facility] 12/11/2016 4:24 PM    [place] at [facility] [address], [place], [place] [address] Phone: [phone]; Fax: [phone]

## 2016-12-11 NOTE — Patient Instructions (Signed)
Your physician recommends that you schedule a follow-up appointment in: we will call you with results    Your physician recommends that you continue on your current medications as directed. Please refer to the Current Medication list given to you today.    Your physician has requested that you have a lower extremity arterial duplex. This test is an ultrasound of the arteries in the legs. It looks at arterial blood flow in the legs. Allow one hour for Lower Arterial scans. There are no restrictions or special instructions      Thank you for choosing [facility] !

## 2016-12-19 ENCOUNTER — Other Ambulatory Visit: Payer: Self-pay | Admitting: Cardiology

## 2016-12-19 DIAGNOSIS — I739 Peripheral vascular disease, unspecified: Secondary | ICD-10-CM

## 2016-12-19 DIAGNOSIS — N183 Chronic kidney disease, stage 3 (moderate): Secondary | ICD-10-CM | POA: Diagnosis not present

## 2016-12-26 DIAGNOSIS — E2839 Other primary ovarian failure: Secondary | ICD-10-CM | POA: Diagnosis not present

## 2017-01-07 DIAGNOSIS — Z299 Encounter for prophylactic measures, unspecified: Secondary | ICD-10-CM | POA: Diagnosis not present

## 2017-01-07 DIAGNOSIS — Z2089 Contact with and (suspected) exposure to other communicable diseases: Secondary | ICD-10-CM | POA: Diagnosis not present

## 2017-01-07 DIAGNOSIS — Z6836 Body mass index (BMI) 36.0-36.9, adult: Secondary | ICD-10-CM | POA: Diagnosis not present

## 2017-01-07 DIAGNOSIS — I10 Essential (primary) hypertension: Secondary | ICD-10-CM | POA: Diagnosis not present

## 2017-01-07 DIAGNOSIS — Z713 Dietary counseling and surveillance: Secondary | ICD-10-CM | POA: Diagnosis not present

## 2017-01-14 DIAGNOSIS — Z299 Encounter for prophylactic measures, unspecified: Secondary | ICD-10-CM | POA: Diagnosis not present

## 2017-01-14 DIAGNOSIS — Z87891 Personal history of nicotine dependence: Secondary | ICD-10-CM | POA: Diagnosis not present

## 2017-01-14 DIAGNOSIS — E1165 Type 2 diabetes mellitus with hyperglycemia: Secondary | ICD-10-CM | POA: Diagnosis not present

## 2017-01-14 DIAGNOSIS — Z6836 Body mass index (BMI) 36.0-36.9, adult: Secondary | ICD-10-CM | POA: Diagnosis not present

## 2017-01-14 DIAGNOSIS — J441 Chronic obstructive pulmonary disease with (acute) exacerbation: Secondary | ICD-10-CM | POA: Diagnosis not present

## 2017-01-14 DIAGNOSIS — I739 Peripheral vascular disease, unspecified: Secondary | ICD-10-CM | POA: Diagnosis not present

## 2017-01-14 DIAGNOSIS — J449 Chronic obstructive pulmonary disease, unspecified: Secondary | ICD-10-CM | POA: Diagnosis not present

## 2017-01-14 DIAGNOSIS — I10 Essential (primary) hypertension: Secondary | ICD-10-CM | POA: Diagnosis not present

## 2017-01-16 ENCOUNTER — Other Ambulatory Visit: Payer: Self-pay | Admitting: Cardiology

## 2017-01-16 DIAGNOSIS — I739 Peripheral vascular disease, unspecified: Secondary | ICD-10-CM

## 2017-01-24 ENCOUNTER — Ambulatory Visit: Payer: Medicare Other

## 2017-01-24 ENCOUNTER — Ambulatory Visit (INDEPENDENT_AMBULATORY_CARE_PROVIDER_SITE_OTHER): Payer: Medicare Other

## 2017-01-24 DIAGNOSIS — I739 Peripheral vascular disease, unspecified: Secondary | ICD-10-CM

## 2017-02-04 ENCOUNTER — Other Ambulatory Visit: Payer: Self-pay | Admitting: "Endocrinology

## 2017-02-14 DIAGNOSIS — E038 Other specified hypothyroidism: Secondary | ICD-10-CM | POA: Diagnosis not present

## 2017-02-14 DIAGNOSIS — R7303 Prediabetes: Secondary | ICD-10-CM | POA: Diagnosis not present

## 2017-02-14 LAB — HEMOGLOBIN A1C: Hemoglobin A1C: 6.8

## 2017-02-14 LAB — TSH: TSH: 3.25 (ref 0.41–5.90)

## 2017-03-06 ENCOUNTER — Ambulatory Visit: Payer: Medicare Other | Admitting: "Endocrinology

## 2017-03-06 DIAGNOSIS — J449 Chronic obstructive pulmonary disease, unspecified: Secondary | ICD-10-CM | POA: Diagnosis not present

## 2017-03-06 DIAGNOSIS — M069 Rheumatoid arthritis, unspecified: Secondary | ICD-10-CM | POA: Diagnosis not present

## 2017-03-07 ENCOUNTER — Ambulatory Visit: Payer: Medicare Other | Admitting: "Endocrinology

## 2017-03-12 DIAGNOSIS — E049 Nontoxic goiter, unspecified: Secondary | ICD-10-CM | POA: Diagnosis not present

## 2017-03-12 DIAGNOSIS — E042 Nontoxic multinodular goiter: Secondary | ICD-10-CM | POA: Diagnosis not present

## 2017-03-13 ENCOUNTER — Ambulatory Visit (INDEPENDENT_AMBULATORY_CARE_PROVIDER_SITE_OTHER): Payer: Medicare Other | Admitting: "Endocrinology

## 2017-03-13 ENCOUNTER — Encounter: Payer: Self-pay | Admitting: "Endocrinology

## 2017-03-13 VITALS — BP 145/76 | HR 65 | Ht 67.0 in | Wt 239.0 lb

## 2017-03-13 DIAGNOSIS — E782 Mixed hyperlipidemia: Secondary | ICD-10-CM | POA: Diagnosis not present

## 2017-03-13 DIAGNOSIS — E118 Type 2 diabetes mellitus with unspecified complications: Secondary | ICD-10-CM | POA: Insufficient documentation

## 2017-03-13 DIAGNOSIS — E038 Other specified hypothyroidism: Secondary | ICD-10-CM | POA: Diagnosis not present

## 2017-03-13 DIAGNOSIS — I10 Essential (primary) hypertension: Secondary | ICD-10-CM | POA: Diagnosis not present

## 2017-03-13 DIAGNOSIS — E042 Nontoxic multinodular goiter: Secondary | ICD-10-CM

## 2017-03-13 MED ORDER — METFORMIN HCL 500 MG PO TABS: 500.0000 mg | ORAL_TABLET | Freq: Two times a day (BID) | ORAL | 6 refills | Status: DC

## 2017-03-13 MED ORDER — LEVOTHYROXINE SODIUM 112 MCG PO TABS: 112.0000 ug | ORAL_TABLET | Freq: Every day | ORAL | 6 refills | Status: DC

## 2017-03-13 NOTE — Progress Notes (Signed)
Subjective:    Patient ID: Dana Lawson, female    DOB: October 08, 1945, PCP Kirstie Peri, MD   Past Medical History:  Diagnosis Date  . Anxiety   . COPD (chronic obstructive pulmonary disease) ([facility])   . Depression   . Essential hypertension   . GERD (gastroesophageal reflux disease)   . Hepatic steatosis   . Hyperlipidemia   . Hypothyroidism   . Type 2 diabetes mellitus ([facility])    Past Surgical History:  Procedure Laterality Date  . LEFT HEART CATH AND CORONARY ANGIOGRAPHY N/A 11/08/2016   Procedure: LEFT HEART CATH AND CORONARY ANGIOGRAPHY;  Surgeon: Marykay Lex, MD;  Location: [facility] INVASIVE CV LAB;  Service: Cardiovascular;  Laterality: N/A;  . TUBAL LIGATION     Social History   Socioeconomic History  . Marital status: Married    Spouse name: None  . Number of children: None  . Years of education: None  . Highest education level: None  Social Needs  . Financial resource strain: None  . Food insecurity - worry: None  . Food insecurity - inability: None  . Transportation needs - medical: None  . Transportation needs - non-medical: None  Occupational History  . None  Tobacco Use  . Smoking status: Former Smoker    Types: Cigarettes    Last attempt to quit: 11/06/2015    Years since quitting: 1.3  . Smokeless tobacco: Never Used  Substance and Sexual Activity  . Alcohol use: Yes    Alcohol/week: 0.0 oz    Comment: Occasional  . Drug use: No  . Sexual activity: None  Other Topics Concern  . None  Social History Narrative  . None   Outpatient Encounter Medications as of 03/13/2017  Medication Sig  . aspirin 81 MG tablet Take 81 mg by mouth daily.  . benazepril (LOTENSIN) 40 MG tablet Take 40 mg by mouth daily.  Marland Kitchen buPROPion (WELLBUTRIN SR) 150 MG 12 hr tablet Take 150 mg by mouth 2 (two) times daily.  . clonazePAM (KLONOPIN) 0.5 MG tablet Take 0.5 mg by mouth 2 (two) times daily as needed for anxiety.  . furosemide (LASIX) 20 MG tablet Take 40 mg by mouth  daily.   Marland Kitchen levothyroxine (SYNTHROID, LEVOTHROID) 112 MCG tablet Take 1 tablet (112 mcg total) by mouth daily before breakfast.  . metFORMIN (GLUCOPHAGE) 500 MG tablet Take 1 tablet (500 mg total) by mouth 2 (two) times daily after a meal.  . metoprolol tartrate (LOPRESSOR) 50 MG tablet Take 50 mg by mouth 2 (two) times daily.  Marland Kitchen omeprazole (PRILOSEC) 20 MG capsule Take 20 mg by mouth daily.  . potassium chloride (K-DUR) 10 MEQ tablet Take 10 mEq by mouth daily.  . rosuvastatin (CRESTOR) 20 MG tablet Take 20 mg by mouth daily.  . [DISCONTINUED] levothyroxine (SYNTHROID, LEVOTHROID) 112 MCG tablet Take 112 mcg by mouth daily before breakfast.   . [DISCONTINUED] metFORMIN (GLUCOPHAGE) 500 MG tablet Take 500 mg by mouth 2 (two) times daily after a meal.  . [DISCONTINUED] metFORMIN (GLUCOPHAGE) 500 MG tablet TAKE 1 TABLET DAILY   No facility-administered encounter medications on file as of 03/13/2017.    ALLERGIES: Allergies  Allergen Reactions  . Flagyl [Metronidazole]    VACCINATION STATUS:  There is no immunization history on file for this patient.  HPI  72 yr old female with hypothyroidism, multinodular goiter, hyperlipidemia, hypertension. she returns with repeat labs for follow-up.  She is currently on levothyroxine 112 mcg p.o. every morning.  She is compliant, has no new complaints.  -She is also on metformin 500 mg p.o. daily.  Her recent labs show A1c of 6.8% consistent with type 2 diabetes.  She denies polydipsia, polyuria.  She is observed that her weight is progressively increasing.  -She has family history of stroke and coronary artery disease. - she denies cold/heat intolerance.  -she denies dysphagia, SOB, voice change.  Her surveillance interim thyroid ultrasound is unremarkable.  Review of Systems   Constitutional: + Has progressive weight gain , + fatigue, no subjective hyperthermia/hypothermia Eyes: no blurry vision, no xerophthalmia, + xerostomia  ENT: no sore throat,  no nodules palpated in throat, no dysphagia nor odynophagia. Cardiovascular: No chest pain, no shortness of breath, no palpitations.   Respiratory: no cough/SOB Gastrointestinal: no N/V/D/C Musculoskeletal: no muscle/joint aches Skin: no rashes Neurological: no tremors/numbness/tingling/dizziness Psychiatric: no depression/anxiety  Objective:    BP (!) 145/76   Pulse 65   Ht 5\' 7"  (1.702 m)   Wt 239 lb (108.4 kg)   BMI 37.43 kg/m   Wt Readings from Last 3 Encounters:  03/13/17 239 lb (108.4 kg)  12/11/16 236 lb (107 kg)  11/08/16 232 lb (105.2 kg)    Physical Exam  Constitutional: Obese, not in acute distress.   Eyes: PERRLA, EOMI, no exophthalmos ENT: moist mucous membranes, + thyroid, no significant thyromegaly , no cervical lymphadenopathy Cardiovascular: RRR, No MRG Respiratory: CTA B Gastrointestinal: abdomen soft, NT, ND, BS+ Musculoskeletal: no deformities, strength intact in all 4 Skin: moist, warm, no rashes Neurological: no tremor with outstretched hands   Assessment & Plan:   1.  hypothyroidism -Her thyroid function tests are consistent with appropriate replacement. -I advised her to continue levothyroxine 112 mcg p.o. every morning.     - We discussed about correct intake of levothyroxine, at fasting, with water, separated by at least 30 minutes from breakfast, and separated by more than 4 hours from calcium, iron, multivitamins, acid reflux medications (PPIs). -Patient is made aware of the fact that thyroid hormone replacement is needed for life, dose to be adjusted by periodic monitoring of thyroid function tests.  2.  Type 2 diabetes-new diagnosis for her.    -She returns with A1c of 6.8% increasing from 5.9% last visit. -I had a long discussion about diabetes and complications.  I have advised her to increase her metformin to 500 mg p.o. twice daily.  -  Suggestion is made for her to avoid simple carbohydrates  from her diet including Cakes, Sweet  Desserts / Pastries, Ice Cream, Soda (diet and regular), Sweet Tea, Candies, Chips, Cookies, Store Bought Juices, Alcohol in Excess of  1-2 drinks a day, Artificial Sweeteners, and "Sugar-free" Products. This will help patient to have stable blood glucose profile and potentially avoid unintended weight gain.  3) hypertension: Her blood pressure is not controlled to target. -She is advised to be consistent in taking her blood pressure medications including benazepril 40 mg p.o. daily, metoprolol 50 mg p.o. twice daily and Lasix as needed.  4) hyperlipidemia: She does not have recent fasting lipid panel. -She is advised to continue Crestor 20 mg p.o. nightly.  Side effects and precautions discussed with her.  5) Multinodular goiter : -Her surveillance thyroid ultrasound is unremarkable with stable thyroid lobe size and unchanged  nodules from last exam.  She will not need any intervention at this time.    - I advised patient to maintain close follow up with 13/01/18, MD for primary care needs.  -  Time spent with the patient: 25 min, of which >50% was spent in reviewing her  current and  previous labs, previous treatments, and medications  doses and developing a plan for long-term care.  - Please refer to Patient Instructions for Blood Glucose Monitoring and Insulin/Medications Dosing Guide"  in media tab for additional information.   Follow up plan: Return in about 6 months (around 09/13/2017) for follow up with pre-visit labs.  Marquis Lunch, MD Phone: [phone]  Fax: [phone]  This note was partially dictated with voice recognition software. Similar sounding words can be transcribed inadequately or may not  be corrected upon review.  03/13/2017, 5:45 PM

## 2017-03-13 NOTE — Patient Instructions (Signed)

## 2017-03-19 ENCOUNTER — Other Ambulatory Visit: Payer: Self-pay

## 2017-03-19 MED ORDER — LEVOTHYROXINE SODIUM 112 MCG PO TABS: 112.0000 ug | ORAL_TABLET | Freq: Every day | ORAL | 1 refills | Status: DC

## 2017-03-19 MED ORDER — METFORMIN HCL 500 MG PO TABS: 500.0000 mg | ORAL_TABLET | Freq: Two times a day (BID) | ORAL | 0 refills | Status: DC

## 2017-04-26 DIAGNOSIS — M069 Rheumatoid arthritis, unspecified: Secondary | ICD-10-CM | POA: Diagnosis not present

## 2017-04-26 DIAGNOSIS — J449 Chronic obstructive pulmonary disease, unspecified: Secondary | ICD-10-CM | POA: Diagnosis not present

## 2017-05-28 DIAGNOSIS — E1165 Type 2 diabetes mellitus with hyperglycemia: Secondary | ICD-10-CM | POA: Diagnosis not present

## 2017-05-28 DIAGNOSIS — N183 Chronic kidney disease, stage 3 (moderate): Secondary | ICD-10-CM | POA: Diagnosis not present

## 2017-05-28 DIAGNOSIS — R609 Edema, unspecified: Secondary | ICD-10-CM | POA: Diagnosis not present

## 2017-05-28 DIAGNOSIS — Z299 Encounter for prophylactic measures, unspecified: Secondary | ICD-10-CM | POA: Diagnosis not present

## 2017-05-28 DIAGNOSIS — J449 Chronic obstructive pulmonary disease, unspecified: Secondary | ICD-10-CM | POA: Diagnosis not present

## 2017-05-28 DIAGNOSIS — I4891 Unspecified atrial fibrillation: Secondary | ICD-10-CM | POA: Diagnosis not present

## 2017-05-28 DIAGNOSIS — I10 Essential (primary) hypertension: Secondary | ICD-10-CM | POA: Diagnosis not present

## 2017-05-28 DIAGNOSIS — R1012 Left upper quadrant pain: Secondary | ICD-10-CM | POA: Diagnosis not present

## 2017-05-28 DIAGNOSIS — I739 Peripheral vascular disease, unspecified: Secondary | ICD-10-CM | POA: Diagnosis not present

## 2017-06-04 DIAGNOSIS — R609 Edema, unspecified: Secondary | ICD-10-CM | POA: Diagnosis not present

## 2017-06-06 DIAGNOSIS — M069 Rheumatoid arthritis, unspecified: Secondary | ICD-10-CM | POA: Diagnosis not present

## 2017-06-06 DIAGNOSIS — J449 Chronic obstructive pulmonary disease, unspecified: Secondary | ICD-10-CM | POA: Diagnosis not present

## 2017-06-10 DIAGNOSIS — R1012 Left upper quadrant pain: Secondary | ICD-10-CM | POA: Diagnosis not present

## 2017-06-10 DIAGNOSIS — K760 Fatty (change of) liver, not elsewhere classified: Secondary | ICD-10-CM | POA: Diagnosis not present

## 2017-07-08 ENCOUNTER — Encounter (HOSPITAL_COMMUNITY): Payer: Self-pay | Admitting: Emergency Medicine

## 2017-07-08 ENCOUNTER — Emergency Department (HOSPITAL_COMMUNITY)
Admission: EM | Admit: 2017-07-08 | Discharge: 2017-07-08 | Disposition: A | Discharge status: Home / Self Care | Payer: Medicare Other | Attending: Emergency Medicine | Admitting: Emergency Medicine

## 2017-07-08 ENCOUNTER — Other Ambulatory Visit: Payer: Self-pay

## 2017-07-08 ENCOUNTER — Emergency Department (HOSPITAL_COMMUNITY): Payer: Medicare Other

## 2017-07-08 DIAGNOSIS — M25572 Pain in left ankle and joints of left foot: Secondary | ICD-10-CM

## 2017-07-08 DIAGNOSIS — Z7982 Long term (current) use of aspirin: Secondary | ICD-10-CM | POA: Diagnosis not present

## 2017-07-08 DIAGNOSIS — M7732 Calcaneal spur, left foot: Secondary | ICD-10-CM | POA: Diagnosis not present

## 2017-07-08 DIAGNOSIS — E119 Type 2 diabetes mellitus without complications: Secondary | ICD-10-CM | POA: Diagnosis not present

## 2017-07-08 DIAGNOSIS — Z79899 Other long term (current) drug therapy: Secondary | ICD-10-CM | POA: Diagnosis not present

## 2017-07-08 DIAGNOSIS — J449 Chronic obstructive pulmonary disease, unspecified: Secondary | ICD-10-CM | POA: Insufficient documentation

## 2017-07-08 DIAGNOSIS — I10 Essential (primary) hypertension: Secondary | ICD-10-CM | POA: Diagnosis not present

## 2017-07-08 DIAGNOSIS — Z87891 Personal history of nicotine dependence: Secondary | ICD-10-CM | POA: Diagnosis not present

## 2017-07-08 DIAGNOSIS — Z7984 Long term (current) use of oral hypoglycemic drugs: Secondary | ICD-10-CM | POA: Diagnosis not present

## 2017-07-08 DIAGNOSIS — E039 Hypothyroidism, unspecified: Secondary | ICD-10-CM | POA: Insufficient documentation

## 2017-07-08 LAB — CBG MONITORING, ED: Glucose-Capillary: 93 mg/dL (ref 70–99)

## 2017-07-08 NOTE — Discharge Instructions (Addendum)
Wear the ankle brace with weight bearing for at least one week.  Take 400 mg ibuprofen every 6-8 hrs as needed for pain.  Call the foot doctor listed to arrange a follow-up appt

## 2017-07-08 NOTE — ED Triage Notes (Signed)
Pt c/o left ankle pain. Denies injury. Has hx of pain with this ankle x 2 years. Pt denies swelling.

## 2017-07-09 NOTE — ED Provider Notes (Signed)
[facility] EMERGENCY DEPARTMENT Provider Note   CSN: [id number] Arrival date & time: 07/08/17  1548     History   Chief Complaint Chief Complaint  Patient presents with  . Ankle Pain    HPI Dana Lawson is a 72 y.o. female.  HPI  Dana Lawson is a 72 y.o. female who presents to the Emergency Department complaining of chronic left ankle pain, with worsening pain for one week.  She describes a throbbing pain to her medial ankle that worsens with weight bearing. She denies injury, but admits to wears crocs frequently.  She also denies swelling, redness, numbness and pain to the foot. She has tried  OTC pain relievers with minimal relief.    Past Medical History:  Diagnosis Date  . Anxiety   . COPD (chronic obstructive pulmonary disease) ([facility])   . Depression   . Essential hypertension   . GERD (gastroesophageal reflux disease)   . Hepatic steatosis   . Hyperlipidemia   . Hypothyroidism   . Type 2 diabetes mellitus [facility])     Patient Active Problem List   Diagnosis Date Noted  . Type 2 diabetes mellitus with complication, without long-term current use of insulin ([facility]) 03/13/2017  . Mixed hyperlipidemia 03/13/2017  . Essential hypertension, benign 03/13/2017  . Abnormal myocardial perfusion study   . Multinodular goiter 09/05/2016  . Other specified hypothyroidism 12/22/2014    Past Surgical History:  Procedure Laterality Date  . LEFT HEART CATH AND CORONARY ANGIOGRAPHY N/A 11/08/2016   Procedure: LEFT HEART CATH AND CORONARY ANGIOGRAPHY;  Surgeon: Marykay Lex, MD;  Location: [facility] INVASIVE CV LAB;  Service: Cardiovascular;  Laterality: N/A;  . TUBAL LIGATION       OB History   None      Home Medications    Prior to Admission medications   Medication Sig Start Date End Date Taking? Authorizing Provider  aspirin 81 MG tablet Take 81 mg by mouth daily.    [provider]  benazepril (LOTENSIN) 40 MG tablet Take 40 mg by mouth daily.    [provider]  buPROPion (WELLBUTRIN SR) 150 MG 12 hr tablet Take 150 mg by mouth 2 (two) times daily.    [provider]  clonazePAM (KLONOPIN) 0.5 MG tablet Take 0.5 mg by mouth 2 (two) times daily as needed for anxiety.    [provider]  furosemide (LASIX) 20 MG tablet Take 40 mg by mouth daily.     [provider]  levothyroxine (SYNTHROID, LEVOTHROID) 112 MCG tablet Take 1 tablet (112 mcg total) by mouth daily before breakfast. 03/19/17   Nida, Denman George, MD  metFORMIN (GLUCOPHAGE) 500 MG tablet Take 1 tablet (500 mg total) by mouth 2 (two) times daily after a meal. 03/19/17   Nida, Denman George, MD  metoprolol tartrate (LOPRESSOR) 50 MG tablet Take 50 mg by mouth 2 (two) times daily.    [provider]  omeprazole (PRILOSEC) 20 MG capsule Take 20 mg by mouth daily.    [provider]  potassium chloride (K-DUR) 10 MEQ tablet Take 10 mEq by mouth daily.    [provider]  rosuvastatin (CRESTOR) 20 MG tablet Take 20 mg by mouth daily.    [provider]    Family History Family History  Problem Relation Age of Onset  . Stroke Mother   . Heart attack Mother   . COPD Father   . Celiac disease Sister   . CVA Brother  Social History Social History   Tobacco Use  . Smoking status: Former Smoker    Types: Cigarettes    Last attempt to quit: 11/06/2015    Years since quitting: 1.6  . Smokeless tobacco: Never Used  Substance Use Topics  . Alcohol use: Yes    Alcohol/week: 0.0 oz    Comment: Occasional  . Drug use: No     Allergies   Flagyl [metronidazole]   Review of Systems Review of Systems  Constitutional: Negative for chills and fever.  Musculoskeletal: Positive for arthralgias (left ankle pain). Negative for back pain and joint swelling.  Skin: Negative for color change and wound.  Neurological: Negative for weakness and numbness.  All other systems reviewed and are negative.    Physical  Exam Updated Vital Signs BP (!) 143/84 (BP Location: Right Arm)   Pulse 83   Temp (!) 97.5 F (36.4 C) (Oral)   Resp 18   Ht 5\' 7"  (1.702 m)   Wt 108.4 kg (239 lb)   SpO2 95%   BMI 37.43 kg/m   Physical Exam  Constitutional: She appears well-developed. No distress.  HENT:  Head: Atraumatic.  Cardiovascular: Normal rate, regular rhythm and intact distal pulses.  Pulmonary/Chest: Effort normal and breath sounds normal.  Musculoskeletal: She exhibits tenderness. She exhibits no edema or deformity.  ttp of the medial left ankle.  No edema, skin changes.  Full ROM of the joint. Foot is non-tender  Neurological: She is alert. No sensory deficit.  Skin: Skin is warm. Capillary refill takes less than 2 seconds. No rash noted. No erythema.  Nursing note and vitals reviewed.    ED Treatments / Results  Labs (all labs ordered are listed, but only abnormal results are displayed) Labs Reviewed  CBG MONITORING, ED    EKG None  Radiology Dg Ankle Complete Left  Result Date: 07/08/2017 CLINICAL DATA:  Medial left ankle pain for 2 days.  No known injury. EXAM: LEFT ANKLE COMPLETE - 3+ VIEW COMPARISON:  None. FINDINGS: No acute bony or joint abnormality is seen. Midfoot osteoarthritis is seen on the lateral view. The patient has a small plantar calcaneal spur. Soft tissues are unremarkable. IMPRESSION: No acute abnormality. Midfoot osteoarthritis. Calcaneal spur. Electronically Signed   By: 09/08/2017 M.D.   On: 07/08/2017 16:11    Procedures Procedures (including critical care time)  Medications Ordered in ED Medications - No data to display   Initial Impression / Assessment and Plan / ED Course  I have reviewed the triage vital signs and the nursing notes.  Pertinent labs & imaging results that were available during my care of the patient were reviewed by me and considered in my medical decision making (see chart for details).     Acute on chronic ankle pain.  NV intact.   No edema, no recent injury.   ASO brace applied for support.  Pt agrees to orthopedic or podiatry f/u.  Final Clinical Impressions(s) / ED Diagnoses   Final diagnoses:  Acute left ankle pain    ED Discharge Orders    None       09/08/2017, PA-C 07/09/17 2314    09/09/17, MD 07/10/17 1453

## 2017-08-19 DIAGNOSIS — M069 Rheumatoid arthritis, unspecified: Secondary | ICD-10-CM | POA: Diagnosis not present

## 2017-08-19 DIAGNOSIS — J449 Chronic obstructive pulmonary disease, unspecified: Secondary | ICD-10-CM | POA: Diagnosis not present

## 2017-08-22 DIAGNOSIS — Z6838 Body mass index (BMI) 38.0-38.9, adult: Secondary | ICD-10-CM | POA: Diagnosis not present

## 2017-08-22 DIAGNOSIS — R600 Localized edema: Secondary | ICD-10-CM | POA: Diagnosis not present

## 2017-08-22 DIAGNOSIS — E1165 Type 2 diabetes mellitus with hyperglycemia: Secondary | ICD-10-CM | POA: Diagnosis not present

## 2017-08-22 DIAGNOSIS — Z299 Encounter for prophylactic measures, unspecified: Secondary | ICD-10-CM | POA: Diagnosis not present

## 2017-08-22 DIAGNOSIS — R1012 Left upper quadrant pain: Secondary | ICD-10-CM | POA: Diagnosis not present

## 2017-08-22 DIAGNOSIS — J029 Acute pharyngitis, unspecified: Secondary | ICD-10-CM | POA: Diagnosis not present

## 2017-09-02 DIAGNOSIS — E1165 Type 2 diabetes mellitus with hyperglycemia: Secondary | ICD-10-CM | POA: Diagnosis not present

## 2017-09-02 DIAGNOSIS — I700 Atherosclerosis of aorta: Secondary | ICD-10-CM | POA: Diagnosis not present

## 2017-09-02 DIAGNOSIS — Z7984 Long term (current) use of oral hypoglycemic drugs: Secondary | ICD-10-CM | POA: Diagnosis not present

## 2017-09-02 DIAGNOSIS — R1012 Left upper quadrant pain: Secondary | ICD-10-CM | POA: Diagnosis not present

## 2017-09-02 DIAGNOSIS — K5730 Diverticulosis of large intestine without perforation or abscess without bleeding: Secondary | ICD-10-CM | POA: Diagnosis not present

## 2017-09-04 DIAGNOSIS — K760 Fatty (change of) liver, not elsewhere classified: Secondary | ICD-10-CM | POA: Diagnosis not present

## 2017-09-04 DIAGNOSIS — Z299 Encounter for prophylactic measures, unspecified: Secondary | ICD-10-CM | POA: Diagnosis not present

## 2017-09-04 DIAGNOSIS — E1165 Type 2 diabetes mellitus with hyperglycemia: Secondary | ICD-10-CM | POA: Diagnosis not present

## 2017-09-04 DIAGNOSIS — Z6838 Body mass index (BMI) 38.0-38.9, adult: Secondary | ICD-10-CM | POA: Diagnosis not present

## 2017-09-04 DIAGNOSIS — I10 Essential (primary) hypertension: Secondary | ICD-10-CM | POA: Diagnosis not present

## 2017-09-11 DIAGNOSIS — E038 Other specified hypothyroidism: Secondary | ICD-10-CM | POA: Diagnosis not present

## 2017-09-11 DIAGNOSIS — E118 Type 2 diabetes mellitus with unspecified complications: Secondary | ICD-10-CM | POA: Diagnosis not present

## 2017-09-12 LAB — COMPLETE METABOLIC PANEL WITH GFR
AG Ratio: 1.6 (calc) (ref 1.0–2.5)
ALKALINE PHOSPHATASE (APISO): 107 U/L (ref 33–130)
ALT: 54 U/L — AB (ref 6–29)
AST: 88 U/L — ABNORMAL HIGH (ref 10–35)
Albumin: 4.2 g/dL (ref 3.6–5.1)
BILIRUBIN TOTAL: 0.4 mg/dL (ref 0.2–1.2)
BUN/Creatinine Ratio: 18 (calc) (ref 6–22)
BUN: 22 mg/dL (ref 7–25)
CHLORIDE: 100 mmol/L (ref 98–110)
CO2: 29 mmol/L (ref 20–32)
Calcium: 10.1 mg/dL (ref 8.6–10.4)
Creat: 1.21 mg/dL — ABNORMAL HIGH (ref 0.50–0.99)
GFR, Est African American: 53 mL/min/{1.73_m2} — ABNORMAL LOW (ref >=60)
GFR, Est Non African American: 46 mL/min/{1.73_m2} — ABNORMAL LOW (ref >=60)
GLUCOSE: 227 mg/dL — AB (ref 65–139)
Globulin: 2.7 g/dL (calc) (ref 1.9–3.7)
Potassium: 5 mmol/L (ref 3.5–5.3)
Sodium: 136 mmol/L (ref 135–146)
TOTAL PROTEIN: 6.9 g/dL (ref 6.1–8.1)

## 2017-09-12 LAB — TSH: TSH: 7.57 m[IU]/L — AB (ref 0.40–4.50)

## 2017-09-12 LAB — HEMOGLOBIN A1C
EAG (MMOL/L): 10.8 (calc)
HEMOGLOBIN A1C: 8.4 %{Hb} — AB (ref <5.7)
Mean Plasma Glucose: 194 (calc)

## 2017-09-12 LAB — T4, FREE: FREE T4: 1.1 ng/dL (ref 0.8–1.8)

## 2017-09-16 ENCOUNTER — Encounter: Payer: Self-pay | Admitting: "Endocrinology

## 2017-09-16 ENCOUNTER — Ambulatory Visit (INDEPENDENT_AMBULATORY_CARE_PROVIDER_SITE_OTHER): Payer: Medicare Other | Admitting: "Endocrinology

## 2017-09-16 VITALS — BP 138/82 | HR 87 | Ht 67.0 in | Wt 247.0 lb

## 2017-09-16 DIAGNOSIS — E038 Other specified hypothyroidism: Secondary | ICD-10-CM

## 2017-09-16 DIAGNOSIS — E118 Type 2 diabetes mellitus with unspecified complications: Secondary | ICD-10-CM | POA: Diagnosis not present

## 2017-09-16 MED ORDER — LEVOTHYROXINE SODIUM 137 MCG PO TABS
137.0000 ug | ORAL_TABLET | Freq: Every day | ORAL | 1 refills | Status: DC
Start: 2017-09-16 — End: 2018-01-14

## 2017-09-16 MED ORDER — METFORMIN HCL 1000 MG PO TABS: 1000.0000 mg | ORAL_TABLET | Freq: Two times a day (BID) | ORAL | 1 refills | Status: DC

## 2017-09-16 NOTE — Progress Notes (Signed)
Endocrinology follow-up note   Subjective:    Patient ID: Dana Lawson, female    DOB: [birth year], PCP Kirstie Peri, MD   Past Medical History:  Diagnosis Date  . Anxiety   . COPD (chronic obstructive pulmonary disease) (HCC)   . Depression   . Essential hypertension   . GERD (gastroesophageal reflux disease)   . Hepatic steatosis   . Hyperlipidemia   . Hypothyroidism   . Type 2 diabetes mellitus (HCC)    Past Surgical History:  Procedure Laterality Date  . LEFT HEART CATH AND CORONARY ANGIOGRAPHY N/A 11/08/2016   Procedure: LEFT HEART CATH AND CORONARY ANGIOGRAPHY;  Surgeon: Marykay Lex, MD;  Location: [facility] INVASIVE CV LAB;  Service: Cardiovascular;  Laterality: N/A;  . TUBAL LIGATION     Social History   Socioeconomic History  . Marital status: Married    Spouse name: Not on file  . Number of children: Not on file  . Years of education: Not on file  . Highest education level: Not on file  Occupational History  . Not on file  Social Needs  . Financial resource strain: Not on file  . Food insecurity:    Worry: Not on file    Inability: Not on file  . Transportation needs:    Medical: Not on file    Non-medical: Not on file  Tobacco Use  . Smoking status: Former Smoker    Types: Cigarettes    Last attempt to quit: 11/06/2015    Years since quitting: 1.8  . Smokeless tobacco: Never Used  Substance and Sexual Activity  . Alcohol use: Yes    Alcohol/week: 0.0 standard drinks    Comment: Occasional  . Drug use: No  . Sexual activity: Not on file  Lifestyle  . Physical activity:    Days per week: Not on file    Minutes per session: Not on file  . Stress: Not on file  Relationships  . Social connections:    Talks on phone: Not on file    Gets together: Not on file    Attends religious service: Not on file    Active member of club or organization: Not on file    Attends meetings of clubs or organizations: Not on file    Relationship status: Not on  file  Other Topics Concern  . Not on file  Social History Narrative  . Not on file   Outpatient Encounter Medications as of 09/16/2017  Medication Sig  . aspirin 81 MG tablet Take 81 mg by mouth daily.  . benazepril (LOTENSIN) 40 MG tablet Take 40 mg by mouth daily.  Marland Kitchen buPROPion (WELLBUTRIN SR) 150 MG 12 hr tablet Take 150 mg by mouth 2 (two) times daily.  . clonazePAM (KLONOPIN) 0.5 MG tablet Take 0.5 mg by mouth 2 (two) times daily as needed for anxiety.  . furosemide (LASIX) 20 MG tablet Take 40 mg by mouth daily.   Marland Kitchen levothyroxine (SYNTHROID, LEVOTHROID) 137 MCG tablet Take 1 tablet (137 mcg total) by mouth daily before breakfast.  . metFORMIN (GLUCOPHAGE) 1000 MG tablet Take 1 tablet (1,000 mg total) by mouth 2 (two) times daily after a meal.  . metoprolol tartrate (LOPRESSOR) 50 MG tablet Take 50 mg by mouth 2 (two) times daily.  Marland Kitchen omeprazole (PRILOSEC) 20 MG capsule Take 20 mg by mouth daily.  . potassium chloride (K-DUR) 10 MEQ tablet Take 10 mEq by mouth daily.  . rosuvastatin (CRESTOR) 20 MG tablet Take 20  mg by mouth daily.  . [DISCONTINUED] levothyroxine (SYNTHROID, LEVOTHROID) 112 MCG tablet Take 1 tablet (112 mcg total) by mouth daily before breakfast.  . [DISCONTINUED] metFORMIN (GLUCOPHAGE) 500 MG tablet Take 1 tablet (500 mg total) by mouth 2 (two) times daily after a meal.   No facility-administered encounter medications on file as of 09/16/2017.    ALLERGIES: Allergies  Allergen Reactions  . Flagyl [Metronidazole]    VACCINATION STATUS:  There is no immunization history on file for this patient.  Diabetes  She presents for her follow-up diabetic visit. She has type 2 diabetes mellitus. Onset time: She was diagnosed at approximate age of 67 years. Her disease course has been worsening. There are no hypoglycemic associated symptoms. There are no diabetic associated symptoms. There are no hypoglycemic complications. Symptoms are worsening. There are no diabetic  complications. Risk factors for coronary artery disease include diabetes mellitus, dyslipidemia, hypertension, obesity, sedentary lifestyle, post-menopausal and tobacco exposure. Current diabetic treatment includes oral agent (monotherapy). Her weight is increasing steadily. She is following a generally unhealthy diet. When asked about meal planning, she reported none. She has not had a previous visit with a dietitian. She rarely participates in exercise. Her home blood glucose trend is increasing steadily. An ACE inhibitor/angiotensin II receptor blocker is being taken. She does not see a podiatrist.Eye exam is not current.    -Also has hypothyroidism levothyroxine currently 112 mcg p.o. every morning, multinodular goiter, hyperlipidemia, hypertension. she returns with repeat labs for follow-up.  She is compliant, has no new complaints.  -She is on metformin 500 mg p.o. daily.  Her recent labs show A1c 8.4% increasing from 6.8%.  She was diagnosed with type 2 diabetes during her last visit.   She reports  polydipsia, polyuria.  She is observed that her weight is progressively increasing.  -She has family history of stroke and coronary artery disease. - she denies cold/heat intolerance.  -she denies dysphagia, SOB, voice change.  Her surveillance interim thyroid ultrasound is unremarkable.  Review of Systems   Constitutional: + Has progressive weight gain , + fatigue, no subjective hyperthermia/hypothermia Eyes: no blurry vision, no xerophthalmia, + xerostomia  ENT: no sore throat, no nodules palpated in throat, no dysphagia nor odynophagia. Cardiovascular: No chest pain, no shortness of breath, no palpitations.   Respiratory: no cough/SOB Gastrointestinal: no N/V/D/C Musculoskeletal: no muscle/joint aches Skin: no rashes Neurological: no tremors/numbness/tingling/dizziness Psychiatric: no depression/anxiety  Objective:    BP 138/82   Pulse 87   Ht 5\' 7"  (1.702 m)   Wt 247 lb (112 kg)    BMI 38.69 kg/m   Wt Readings from Last 3 Encounters:  09/16/17 247 lb (112 kg)  07/08/17 239 lb (108.4 kg)  03/13/17 239 lb (108.4 kg)    Physical Exam  Constitutional: Obese, not in acute distress.   Eyes: PERRLA, EOMI, no exophthalmos ENT: moist mucous membranes, + thyroid, no significant thyromegaly , no cervical lymphadenopathy Cardiovascular: RRR, No MRG Respiratory: Her to auscultation bilaterally.   Gastrointestinal: abdomen obese and soft, nontender.  Musculoskeletal: no deformities, strength intact in all 4 Skin: moist, warm, no rashes Neurological: no tremor with outstretched hands  Recent Results (from the past 2160 hour(s))  CBG monitoring, ED     Status: None   Collection Time: 07/08/17  3:55 PM  Result Value Ref Range   Glucose-Capillary 93 70 - 99 mg/dL  COMPLETE METABOLIC PANEL WITH GFR     Status: Abnormal   Collection Time: 09/11/17  3:29 PM  Result Value Ref Range   Glucose, Bld 227 (H) 65 - 139 mg/dL    Comment: .        Non-fasting reference interval .    BUN 22 7 - 25 mg/dL   Creat 5.36 (H) 4.68 - 0.99 mg/dL    Comment: For patients >18 years of age, the reference limit for Creatinine is approximately 13% higher for people identified as African-American. .    GFR, Est Non African American 46 (L) > OR = 60 mL/min/1.49m2   GFR, Est African American 53 (L) > OR = 60 mL/min/1.12m2   BUN/Creatinine Ratio 18 6 - 22 (calc)   Sodium 136 135 - 146 mmol/L   Potassium 5.0 3.5 - 5.3 mmol/L   Chloride 100 98 - 110 mmol/L   CO2 29 20 - 32 mmol/L   Calcium 10.1 8.6 - 10.4 mg/dL   Total Protein 6.9 6.1 - 8.1 g/dL   Albumin 4.2 3.6 - 5.1 g/dL   Globulin 2.7 1.9 - 3.7 g/dL (calc)   AG Ratio 1.6 1.0 - 2.5 (calc)   Total Bilirubin 0.4 0.2 - 1.2 mg/dL   Alkaline phosphatase (APISO) 107 33 - 130 U/L   AST 88 (H) 10 - 35 U/L   ALT 54 (H) 6 - 29 U/L  Hemoglobin A1c     Status: Abnormal   Collection Time: 09/11/17  3:29 PM  Result Value Ref Range   Hgb A1c MFr  Bld 8.4 (H) <5.7 % of total Hgb    Comment: For someone without known diabetes, a hemoglobin A1c value of 6.5% or greater indicates that they may have  diabetes and this should be confirmed with a follow-up  test. . For someone with known diabetes, a value <7% indicates  that their diabetes is well controlled and a value  greater than or equal to 7% indicates suboptimal  control. A1c targets should be individualized based on  duration of diabetes, age, comorbid conditions, and  other considerations. . Currently, no consensus exists regarding use of hemoglobin A1c for diagnosis of diabetes for children. .    Mean Plasma Glucose 194 (calc)   eAG (mmol/L) 10.8 (calc)  TSH     Status: Abnormal   Collection Time: 09/11/17  3:29 PM  Result Value Ref Range   TSH 7.57 (H) 0.40 - 4.50 mIU/L  T4, free     Status: None   Collection Time: 09/11/17  3:29 PM  Result Value Ref Range   Free T4 1.1 0.8 - 1.8 ng/dL     Assessment & Plan:   1.  hypothyroidism -Her thyroid function tests are consistent with inadequate replacement, mainly due to significant weight gain since last visit.  She would benefit from a higher dose of levothyroxine. -I discussed and increase her levothyroxine to 137 mcg p.o. Nightly.   - We discussed about correct intake of levothyroxine, at fasting, with water, separated by at least 30 minutes from breakfast, and separated by more than 4 hours from calcium, iron, multivitamins, acid reflux medications (PPIs). -Patient is made aware of the fact that thyroid hormone replacement is needed for life, dose to be adjusted by periodic monitoring of thyroid function tests.  2.  Type 2 diabetes-new diagnosis for her.    -She returns with higher A1c of 8.4% increasing from 6.8%.  I discussed and increased her metformin to 1000 mg p.o. twice daily.   -I had a long discussion about diabetes and complications.  She will be scheduled to see  Norm Salt, CDE, for individualized  diabetes education.    -  Suggestion is made for her to avoid simple carbohydrates  from her diet including Cakes, Sweet Desserts / Pastries, Ice Cream, Soda (diet and regular), Sweet Tea, Candies, Chips, Cookies, Store Bought Juices, Alcohol in Excess of  1-2 drinks a day, Artificial Sweeteners, and "Sugar-free" Products. This will help patient to have stable blood glucose profile and potentially avoid unintended weight gain.  3) hypertension: Her blood pressure is controlled to target.   -She is advised to be consistent in taking her blood pressure medications including benazepril 40 mg p.o. daily, metoprolol 50 mg p.o. twice daily and Lasix as needed.  4) hyperlipidemia: She does not have recent fasting lipid panel.  She is advised to continue Crestor 20 mg p.o. nightly.  Side effects and precautions discussed with her.  5) Multinodular goiter : -Her surveillance thyroid ultrasound is unremarkable with stable thyroid lobe size and unchanged  nodules from last exam.  She will not need any intervention at this time.    - I advised patient to maintain close follow up with Kirstie Peri, MD for primary care needs.  - Time spent with the patient: 25 min, of which >50% was spent in reviewing her  current and  previous labs, previous treatments, and medications doses and developing a plan for long-term care.  Dana Lawson participated in the discussions, expressed understanding, and voiced agreement with the above plans.  All questions were answered to her satisfaction. she is encouraged to contact clinic should she have any questions or concerns prior to her return visit.  Follow up plan: Return in about 3 months (around 12/16/2017) for Follow up with Pre-visit Labs.  Marquis Lunch, MD Phone: [phone]  Fax: [phone]  This note was partially dictated with voice recognition software. Similar sounding words can be transcribed inadequately or may not  be corrected upon review.  09/16/2017, 4:59  PM

## 2017-09-16 NOTE — Patient Instructions (Signed)

## 2017-10-11 DIAGNOSIS — Z79899 Other long term (current) drug therapy: Secondary | ICD-10-CM | POA: Diagnosis not present

## 2017-10-11 DIAGNOSIS — M7989 Other specified soft tissue disorders: Secondary | ICD-10-CM | POA: Diagnosis not present

## 2017-10-11 DIAGNOSIS — Z7984 Long term (current) use of oral hypoglycemic drugs: Secondary | ICD-10-CM | POA: Diagnosis not present

## 2017-10-11 DIAGNOSIS — L03115 Cellulitis of right lower limb: Secondary | ICD-10-CM | POA: Diagnosis not present

## 2017-10-11 DIAGNOSIS — M79671 Pain in right foot: Secondary | ICD-10-CM | POA: Diagnosis not present

## 2017-10-11 DIAGNOSIS — Z8249 Family history of ischemic heart disease and other diseases of the circulatory system: Secondary | ICD-10-CM | POA: Diagnosis not present

## 2017-10-11 DIAGNOSIS — I10 Essential (primary) hypertension: Secondary | ICD-10-CM | POA: Diagnosis not present

## 2017-10-11 DIAGNOSIS — Z87891 Personal history of nicotine dependence: Secondary | ICD-10-CM | POA: Diagnosis not present

## 2017-10-11 DIAGNOSIS — R600 Localized edema: Secondary | ICD-10-CM | POA: Diagnosis not present

## 2017-10-11 DIAGNOSIS — Z7982 Long term (current) use of aspirin: Secondary | ICD-10-CM | POA: Diagnosis not present

## 2017-10-14 DIAGNOSIS — J449 Chronic obstructive pulmonary disease, unspecified: Secondary | ICD-10-CM | POA: Diagnosis not present

## 2017-10-14 DIAGNOSIS — M069 Rheumatoid arthritis, unspecified: Secondary | ICD-10-CM | POA: Diagnosis not present

## 2017-10-28 ENCOUNTER — Ambulatory Visit: Payer: Medicare Other | Admitting: Nutrition

## 2017-11-20 DIAGNOSIS — Z7189 Other specified counseling: Secondary | ICD-10-CM | POA: Diagnosis not present

## 2017-11-20 DIAGNOSIS — Z1331 Encounter for screening for depression: Secondary | ICD-10-CM | POA: Diagnosis not present

## 2017-11-20 DIAGNOSIS — E039 Hypothyroidism, unspecified: Secondary | ICD-10-CM | POA: Diagnosis not present

## 2017-11-20 DIAGNOSIS — Z1211 Encounter for screening for malignant neoplasm of colon: Secondary | ICD-10-CM | POA: Diagnosis not present

## 2017-11-20 DIAGNOSIS — Z6838 Body mass index (BMI) 38.0-38.9, adult: Secondary | ICD-10-CM | POA: Diagnosis not present

## 2017-11-20 DIAGNOSIS — R5383 Other fatigue: Secondary | ICD-10-CM | POA: Diagnosis not present

## 2017-11-20 DIAGNOSIS — Z1339 Encounter for screening examination for other mental health and behavioral disorders: Secondary | ICD-10-CM | POA: Diagnosis not present

## 2017-11-20 DIAGNOSIS — Z0000 Encounter for general adult medical examination without abnormal findings: Secondary | ICD-10-CM | POA: Diagnosis not present

## 2017-11-20 DIAGNOSIS — I10 Essential (primary) hypertension: Secondary | ICD-10-CM | POA: Diagnosis not present

## 2017-11-20 DIAGNOSIS — Z79899 Other long term (current) drug therapy: Secondary | ICD-10-CM | POA: Diagnosis not present

## 2017-11-20 DIAGNOSIS — E7800 Pure hypercholesterolemia, unspecified: Secondary | ICD-10-CM | POA: Diagnosis not present

## 2017-11-20 DIAGNOSIS — Z23 Encounter for immunization: Secondary | ICD-10-CM | POA: Diagnosis not present

## 2017-11-20 DIAGNOSIS — Z299 Encounter for prophylactic measures, unspecified: Secondary | ICD-10-CM | POA: Diagnosis not present

## 2017-12-10 DIAGNOSIS — E038 Other specified hypothyroidism: Secondary | ICD-10-CM | POA: Diagnosis not present

## 2017-12-10 DIAGNOSIS — E118 Type 2 diabetes mellitus with unspecified complications: Secondary | ICD-10-CM | POA: Diagnosis not present

## 2017-12-11 LAB — COMPLETE METABOLIC PANEL WITH GFR
AG RATIO: 1.6 (calc) (ref 1.0–2.5)
ALBUMIN MSPROF: 4.3 g/dL (ref 3.6–5.1)
ALT: 46 U/L — ABNORMAL HIGH (ref 6–29)
AST: 65 U/L — ABNORMAL HIGH (ref 10–35)
Alkaline phosphatase (APISO): 93 U/L (ref 33–130)
BILIRUBIN TOTAL: 0.5 mg/dL (ref 0.2–1.2)
BUN / CREAT RATIO: 13 (calc) (ref 6–22)
BUN: 18 mg/dL (ref 7–25)
CALCIUM: 9.7 mg/dL (ref 8.6–10.4)
CO2: 28 mmol/L (ref 20–32)
Chloride: 100 mmol/L (ref 98–110)
Creat: 1.35 mg/dL — ABNORMAL HIGH (ref 0.60–0.93)
GFR, EST AFRICAN AMERICAN: 46 mL/min/{1.73_m2} — AB (ref >=60)
GFR, EST NON AFRICAN AMERICAN: 40 mL/min/{1.73_m2} — AB (ref >=60)
Globulin: 2.7 g/dL (calc) (ref 1.9–3.7)
Glucose, Bld: 87 mg/dL (ref 65–139)
POTASSIUM: 4.5 mmol/L (ref 3.5–5.3)
Sodium: 140 mmol/L (ref 135–146)
TOTAL PROTEIN: 7 g/dL (ref 6.1–8.1)

## 2017-12-11 LAB — T4, FREE: Free T4: 1.6 ng/dL (ref 0.8–1.8)

## 2017-12-11 LAB — TSH: TSH: 5.48 mIU/L — ABNORMAL HIGH (ref 0.40–4.50)

## 2017-12-11 LAB — HEMOGLOBIN A1C
Hgb A1c MFr Bld: 8.1 % of total Hgb — ABNORMAL HIGH (ref <5.7)
MEAN PLASMA GLUCOSE: 186 (calc)
eAG (mmol/L): 10.3 (calc)

## 2017-12-16 ENCOUNTER — Ambulatory Visit: Payer: Medicare Other | Admitting: "Endocrinology

## 2017-12-27 DIAGNOSIS — Z79899 Other long term (current) drug therapy: Secondary | ICD-10-CM | POA: Diagnosis not present

## 2017-12-27 DIAGNOSIS — Z7982 Long term (current) use of aspirin: Secondary | ICD-10-CM | POA: Diagnosis not present

## 2017-12-27 DIAGNOSIS — I10 Essential (primary) hypertension: Secondary | ICD-10-CM | POA: Diagnosis not present

## 2017-12-27 DIAGNOSIS — M25561 Pain in right knee: Secondary | ICD-10-CM | POA: Diagnosis not present

## 2017-12-27 DIAGNOSIS — Z7984 Long term (current) use of oral hypoglycemic drugs: Secondary | ICD-10-CM | POA: Diagnosis not present

## 2017-12-27 DIAGNOSIS — R609 Edema, unspecified: Secondary | ICD-10-CM | POA: Diagnosis not present

## 2017-12-27 DIAGNOSIS — Z87891 Personal history of nicotine dependence: Secondary | ICD-10-CM | POA: Diagnosis not present

## 2017-12-27 DIAGNOSIS — E119 Type 2 diabetes mellitus without complications: Secondary | ICD-10-CM | POA: Diagnosis not present

## 2017-12-27 DIAGNOSIS — M25562 Pain in left knee: Secondary | ICD-10-CM | POA: Diagnosis not present

## 2017-12-27 DIAGNOSIS — M79671 Pain in right foot: Secondary | ICD-10-CM | POA: Diagnosis not present

## 2017-12-27 DIAGNOSIS — K219 Gastro-esophageal reflux disease without esophagitis: Secondary | ICD-10-CM | POA: Diagnosis not present

## 2017-12-27 DIAGNOSIS — F419 Anxiety disorder, unspecified: Secondary | ICD-10-CM | POA: Diagnosis not present

## 2018-01-14 ENCOUNTER — Encounter: Payer: Self-pay | Admitting: "Endocrinology

## 2018-01-14 ENCOUNTER — Ambulatory Visit (INDEPENDENT_AMBULATORY_CARE_PROVIDER_SITE_OTHER): Payer: Medicare Other | Admitting: "Endocrinology

## 2018-01-14 VITALS — BP 128/74 | HR 78 | Ht 67.0 in | Wt 239.0 lb

## 2018-01-14 DIAGNOSIS — E782 Mixed hyperlipidemia: Secondary | ICD-10-CM | POA: Diagnosis not present

## 2018-01-14 DIAGNOSIS — E038 Other specified hypothyroidism: Secondary | ICD-10-CM

## 2018-01-14 DIAGNOSIS — E118 Type 2 diabetes mellitus with unspecified complications: Secondary | ICD-10-CM | POA: Diagnosis not present

## 2018-01-14 DIAGNOSIS — I10 Essential (primary) hypertension: Secondary | ICD-10-CM | POA: Diagnosis not present

## 2018-01-14 MED ORDER — METFORMIN HCL 500 MG PO TABS: 500.0000 mg | ORAL_TABLET | Freq: Two times a day (BID) | ORAL | 6 refills | Status: DC

## 2018-01-14 MED ORDER — LEVOTHYROXINE SODIUM 150 MCG PO TABS: 150.0000 ug | ORAL_TABLET | Freq: Every day | ORAL | 6 refills | Status: DC

## 2018-01-14 NOTE — Progress Notes (Signed)
Endocrinology follow-up note   Subjective:    Patient ID: Dana LloydLinda D Lawson, female    DOB: 1945-08-29, PCP Kirstie PeriShah, Ashish, MD   Past Medical History:  Diagnosis Date  . Anxiety   . COPD (chronic obstructive pulmonary disease) (HCC)   . Depression   . Essential hypertension   . GERD (gastroesophageal reflux disease)   . Hepatic steatosis   . Hyperlipidemia   . Hypothyroidism   . Type 2 diabetes mellitus (HCC)    Past Surgical History:  Procedure Laterality Date  . LEFT HEART CATH AND CORONARY ANGIOGRAPHY N/A 11/08/2016   Procedure: LEFT HEART CATH AND CORONARY ANGIOGRAPHY;  Surgeon: Marykay LexHarding, David W, MD;  [facility] INVAS[facility] CV LAB;  Service: Cardiovascular;  Laterality: N/A;  . TUBAL LIGATION     Social History   Socioeconomic History  . Marital status: Married    Spouse name: Not on file  . Number of children: Not on file  . Years of education: Not on file  . Highest education level: Not on file  Occupational History  . Not on file  Social Needs  . Financial resource strain: Not on file  . Food insecurity:    Worry: Not on file    Inability: Not on file  . Transportation needs:    Medical: Not on file    Non-medical: Not on file  Tobacco Use  . Smoking status: Former Smoker    Types: Cigarettes    Last attempt to quit: 11/06/2015    Years since quitting: 2.1  . Smokeless tobacco: Never Used  Substance and Sexual Activity  . Alcohol use: Yes    Alcohol/week: 0.0 standard drinks    Comment: Occasional  . Drug use: No  . Sexual activity: Not on file  Lifestyle  . Physical activity:    Days per week: Not on file    Minutes per session: Not on file  . Stress: Not on file  Relationships  . Social connections:    Talks on phone: Not on file    Gets together: Not on file    Attends religious service: Not on file    Active member of club or organization: Not on file    Attends meetings of clubs or organizations: Not on file    Relationship status: Not on  file  Other Topics Concern  . Not on file  Social History Narrative  . Not on file   Outpatient Encounter Medications as of 01/14/2018  Medication Sig  . aspirin 81 MG tablet Take 81 mg by mouth daily.  . benazepril (LOTENSIN) 40 MG tablet Take 40 mg by mouth daily.  Marland Kitchen. buPROPion (WELLBUTRIN SR) 150 MG 12 hr tablet Take 150 mg by mouth 2 (two) times daily.  . furosemide (LASIX) 40 MG tablet Take 40 mg by mouth daily.   Marland Kitchen. levothyroxine (SYNTHROID, LEVOTHROID) 150 MCG tablet Take 1 tablet (150 mcg total) by mouth daily before breakfast.  . metFORMIN (GLUCOPHAGE) 500 MG tablet Take 1 tablet (500 mg total) by mouth 2 (two) times daily after a meal.  . omeprazole (PRILOSEC) 20 MG capsule Take 20 mg by mouth daily.  . potassium chloride (K-DUR) 10 MEQ tablet Take 10 mEq by mouth daily.  . rosuvastatin (CRESTOR) 20 MG tablet Take 20 mg by mouth daily.  . [DISCONTINUED] clonazePAM (KLONOPIN) 0.5 MG tablet Take 0.5 mg by mouth 2 (two) times daily as needed for anxiety.  . [DISCONTINUED] levothyroxine (SYNTHROID, LEVOTHROID) 137 MCG tablet Take 1 tablet (  137 mcg total) by mouth daily before breakfast.  . [DISCONTINUED] metFORMIN (GLUCOPHAGE) 1000 MG tablet Take 1 tablet (1,000 mg total) by mouth 2 (two) times daily after a meal.  . [DISCONTINUED] metoprolol tartrate (LOPRESSOR) 50 MG tablet Take 50 mg by mouth 2 (two) times daily.   No facility-administered encounter medications on file as of 01/14/2018.    ALLERGIES: Allergies  Allergen Reactions  . Flagyl [Metronidazole]    VACCINATION STATUS:  There is no immunization history on file for this patient.  Diabetes  She presents for her follow-up diabetic visit. She has type 2 diabetes mellitus. Onset time: She was diagnosed at approximate age of 89 years. Her disease course has been improving. There are no hypoglycemic associated symptoms. There are no diabetic associated symptoms. There are no hypoglycemic complications. Symptoms are improving.  There are no diabetic complications. Risk factors for coronary artery disease include diabetes mellitus, dyslipidemia, hypertension, obesity, sedentary lifestyle, post-menopausal and tobacco exposure. Current diabetic treatment includes oral agent (monotherapy). Her weight is increasing steadily. She is following a generally unhealthy diet. When asked about meal planning, she reported none. She has not had a previous visit with a dietitian. She rarely participates in exercise. Her home blood glucose trend is decreasing steadily. An ACE inhibitor/angiotensin II receptor blocker is being taken. She does not see a podiatrist.Eye exam is not current.    -Also has hypothyroidism levothyroxine currently 112 mcg p.o. every morning, multinodular goiter, hyperlipidemia, hypertension. she returns with repeat labs for follow-up.  She is compliant, has no new complaints.  -She is on metformin 500 mg p.o. daily.  Her recent labs show A1c 8.4% increasing from 6.8%.  She was diagnosed with type 2 diabetes during her last visit.   She reports  polydipsia, polyuria.  She is observed that her weight is progressively increasing.  -She has family history of stroke and coronary artery disease. - she denies cold/heat intolerance.  -she denies dysphagia, SOB, voice change.  Her surveillance interim thyroid ultrasound is unremarkable.  Review of Systems   Constitutional: + Has lost weight since last visit, + fatigue, no subjective hyperthermia/hypothermia Eyes: no blurry vision, no xerophthalmia ENT: no sore throat, no nodules palpated in throat, no dysphagia nor odynophagia. Cardiovascular: No chest pain, no shortness of breath, no palpitations.   Respiratory: no cough/SOB Gastrointestinal: no N/V/D/C Musculoskeletal: no muscle/joint aches Skin: no rashes Neurological: no tremors/numbness/tingling/dizziness Psychiatric: no depression/anxiety  Objective:    BP 128/74   Pulse 78   Ht 5\' 7"  (1.702 m)   Wt 239 lb  (108.4 kg)   BMI 37.43 kg/m   Wt Readings from Last 3 Encounters:  01/14/18 239 lb (108.4 kg)  09/16/17 247 lb (112 kg)  07/08/17 239 lb (108.4 kg)    Physical Exam  Constitutional: Obese, not in acute distress.   Eyes: PERRLA, EOMI, no exophthalmos ENT: moist mucous membranes, + thyroid, no significant thyromegaly , no cervical lymphadenopathy Cardiovascular: RRR, No MRG Respiratory: Her to auscultation bilaterally.   Gastrointestinal: abdomen obese and soft, nontender.  Musculoskeletal: no deformities, strength intact in all 4, + trace pedal edema of right lower extremity Skin: moist, warm, no rashes Neurological: no tremor with outstretched hands  Recent Results (from the past 2160 hour(s))  Hemoglobin A1c     Status: Abnormal   Collection Time: 12/10/17  3:14 PM  Result Value Ref Range   Hgb A1c MFr Bld 8.1 (H) <5.7 % of total Hgb    Comment: For someone without known diabetes,  a hemoglobin A1c value of 6.5% or greater indicates that they may have  diabetes and this should be confirmed with a follow-up  test. . For someone with known diabetes, a value <7% indicates  that their diabetes is well controlled and a value  greater than or equal to 7% indicates suboptimal  control. A1c targets should be individualized based on  duration of diabetes, age, comorbid conditions, and  other considerations. . Currently, no consensus exists regarding use of hemoglobin A1c for diagnosis of diabetes for children. .    Mean Plasma Glucose 186 (calc)   eAG (mmol/L) 10.3 (calc)  COMPLETE METABOLIC PANEL WITH GFR     Status: Abnormal   Collection Time: 12/10/17  3:14 PM  Result Value Ref Range   Glucose, Bld 87 65 - 139 mg/dL    Comment: .        Non-fasting reference interval .    BUN 18 7 - 25 mg/dL   Creat 1.61 (H) 0.96 - 0.93 mg/dL    Comment: For patients >46 years of age, the reference limit for Creatinine is approximately 13% higher for people identified as  African-American. .    GFR, Est Non African American 40 (L) > OR = 60 mL/min/1.32m2   GFR, Est African American 46 (L) > OR = 60 mL/min/1.58m2   BUN/Creatinine Ratio 13 6 - 22 (calc)   Sodium 140 135 - 146 mmol/L   Potassium 4.5 3.5 - 5.3 mmol/L   Chloride 100 98 - 110 mmol/L   CO2 28 20 - 32 mmol/L   Calcium 9.7 8.6 - 10.4 mg/dL   Total Protein 7.0 6.1 - 8.1 g/dL   Albumin 4.3 3.6 - 5.1 g/dL   Globulin 2.7 1.9 - 3.7 g/dL (calc)   AG Ratio 1.6 1.0 - 2.5 (calc)   Total Bilirubin 0.5 0.2 - 1.2 mg/dL   Alkaline phosphatase (APISO) 93 33 - 130 U/L   AST 65 (H) 10 - 35 U/L   ALT 46 (H) 6 - 29 U/L  TSH     Status: Abnormal   Collection Time: 12/10/17  3:14 PM  Result Value Ref Range   TSH 5.48 (H) 0.40 - 4.50 mIU/L  T4, free     Status: None   Collection Time: 12/10/17  3:14 PM  Result Value Ref Range   Free T4 1.6 0.8 - 1.8 ng/dL     Assessment & Plan:   1.  hypothyroidism -Her thyroid function tests are consistent with inadequate replacement.  She would benefit from slight increase in her levothyroxine dose.  I discussed and increased her levothyroxine to 150 mcg p.o. every morning.    - We discussed about correct intake of levothyroxine, at fasting, with water, separated by at least 30 minutes from breakfast, and separated by more than 4 hours from calcium, iron, multivitamins, acid reflux medications (PPIs). -Patient is made aware of the fact that thyroid hormone replacement is needed for life, dose to be adjusted by periodic monitoring of thyroid function tests. 2.  Type 2 diabetes-new diagnosis for her.    -She returns with better A1c of 8.1%.  However, her labs are consistent with worsening renal function with GFR of 40.  I discussed and lowered her metformin to 500 mg p.o. twice daily with breakfast and supper. .   -I had a long discussion about progressive nature of type II diabetes and complications.  She will be scheduled to see Norm Salt, CDE, for individualized  diabetes education.   -  Suggestion is made for her to avoid simple carbohydrates  from her diet including Cakes, Sweet Desserts / Pastries, Ice Cream, Soda (diet and regular), Sweet Tea, Candies, Chips, Cookies, Store Bought Juices, Alcohol in Excess of  1-2 drinks a day, Artificial Sweeteners, and "Sugar-free" Products. This will help patient to have stable blood glucose profile and potentially avoid unintended weight gain.   3) hypertension: Her blood pressure is controlled to target.   -She is advised to be consistent in taking her blood pressure medications including benazepril 40 mg p.o. daily, metoprolol 50 mg p.o. twice daily and Lasix as needed.  4) hyperlipidemia: She does not have recent fasting lipid panel.  She is advised to continue Crestor 20 mg p.o. nightly.  Side effects and precautions discussed with her.  5) Multinodular goiter : -Her surveillance thyroid ultrasound is unremarkable with stable thyroid lobe size and unchanged  nodules from last exam.  She will not need any intervention at this time.    - I advised patient to maintain close follow up with Kirstie Peri, MD for primary care needs.  - Time spent with the patient: 25 min, of which >50% was spent in reviewing her  current and  previous labs, current and previous imaging studies, previous treatments, and medications doses and developing a plan for long-term care.  Dana Lawson participated in the discussions, expressed understanding, and voiced agreement with the above plans.  All questions were answered to her satisfaction. she is encouraged to contact clinic should she have any questions or concerns prior to her return visit.   Follow up plan: Return in about 4 months (around 05/15/2018) for Follow up with Pre-visit Labs.  Marquis Lunch, MD Phone: [phone]  Fax: [phone]  This note was partially dictated with voice recognition software. Similar sounding words can be transcribed inadequately or may not  be  corrected upon review.  01/14/2018, 3:56 PM

## 2018-01-14 NOTE — Patient Instructions (Signed)

## 2018-01-29 DIAGNOSIS — M069 Rheumatoid arthritis, unspecified: Secondary | ICD-10-CM | POA: Diagnosis not present

## 2018-01-29 DIAGNOSIS — E119 Type 2 diabetes mellitus without complications: Secondary | ICD-10-CM | POA: Diagnosis not present

## 2018-01-29 DIAGNOSIS — J449 Chronic obstructive pulmonary disease, unspecified: Secondary | ICD-10-CM | POA: Diagnosis not present

## 2018-01-29 DIAGNOSIS — E7800 Pure hypercholesterolemia, unspecified: Secondary | ICD-10-CM | POA: Diagnosis not present

## 2018-02-07 DIAGNOSIS — Z87891 Personal history of nicotine dependence: Secondary | ICD-10-CM | POA: Diagnosis not present

## 2018-02-07 DIAGNOSIS — J069 Acute upper respiratory infection, unspecified: Secondary | ICD-10-CM | POA: Diagnosis not present

## 2018-02-07 DIAGNOSIS — Z6838 Body mass index (BMI) 38.0-38.9, adult: Secondary | ICD-10-CM | POA: Diagnosis not present

## 2018-02-07 DIAGNOSIS — E039 Hypothyroidism, unspecified: Secondary | ICD-10-CM | POA: Diagnosis not present

## 2018-02-07 DIAGNOSIS — Z299 Encounter for prophylactic measures, unspecified: Secondary | ICD-10-CM | POA: Diagnosis not present

## 2018-02-07 DIAGNOSIS — I10 Essential (primary) hypertension: Secondary | ICD-10-CM | POA: Diagnosis not present

## 2018-02-20 DIAGNOSIS — I10 Essential (primary) hypertension: Secondary | ICD-10-CM | POA: Diagnosis not present

## 2018-02-20 DIAGNOSIS — E1165 Type 2 diabetes mellitus with hyperglycemia: Secondary | ICD-10-CM | POA: Diagnosis not present

## 2018-02-20 DIAGNOSIS — J449 Chronic obstructive pulmonary disease, unspecified: Secondary | ICD-10-CM | POA: Diagnosis not present

## 2018-02-20 DIAGNOSIS — I739 Peripheral vascular disease, unspecified: Secondary | ICD-10-CM | POA: Diagnosis not present

## 2018-02-20 DIAGNOSIS — Z299 Encounter for prophylactic measures, unspecified: Secondary | ICD-10-CM | POA: Diagnosis not present

## 2018-02-20 DIAGNOSIS — Z6836 Body mass index (BMI) 36.0-36.9, adult: Secondary | ICD-10-CM | POA: Diagnosis not present

## 2018-02-26 DIAGNOSIS — E119 Type 2 diabetes mellitus without complications: Secondary | ICD-10-CM | POA: Diagnosis not present

## 2018-02-26 DIAGNOSIS — J449 Chronic obstructive pulmonary disease, unspecified: Secondary | ICD-10-CM | POA: Diagnosis not present

## 2018-02-26 DIAGNOSIS — E7800 Pure hypercholesterolemia, unspecified: Secondary | ICD-10-CM | POA: Diagnosis not present

## 2018-02-26 DIAGNOSIS — M069 Rheumatoid arthritis, unspecified: Secondary | ICD-10-CM | POA: Diagnosis not present

## 2018-05-01 DIAGNOSIS — M069 Rheumatoid arthritis, unspecified: Secondary | ICD-10-CM | POA: Diagnosis not present

## 2018-05-01 DIAGNOSIS — J449 Chronic obstructive pulmonary disease, unspecified: Secondary | ICD-10-CM | POA: Diagnosis not present

## 2018-05-01 DIAGNOSIS — E119 Type 2 diabetes mellitus without complications: Secondary | ICD-10-CM | POA: Diagnosis not present

## 2018-05-01 DIAGNOSIS — E7800 Pure hypercholesterolemia, unspecified: Secondary | ICD-10-CM | POA: Diagnosis not present

## 2018-05-13 DIAGNOSIS — E038 Other specified hypothyroidism: Secondary | ICD-10-CM | POA: Diagnosis not present

## 2018-05-13 DIAGNOSIS — E118 Type 2 diabetes mellitus with unspecified complications: Secondary | ICD-10-CM | POA: Diagnosis not present

## 2018-05-14 LAB — HEMOGLOBIN A1C
EAG (MMOL/L): 8.2 (calc)
HEMOGLOBIN A1C: 6.8 %{Hb} — AB (ref <5.7)
Mean Plasma Glucose: 148 (calc)

## 2018-05-14 LAB — COMPLETE METABOLIC PANEL WITH GFR
AG Ratio: 1.7 (calc) (ref 1.0–2.5)
ALT: 27 U/L (ref 6–29)
AST: 30 U/L (ref 10–35)
Albumin: 4.3 g/dL (ref 3.6–5.1)
Alkaline phosphatase (APISO): 81 U/L (ref 37–153)
BILIRUBIN TOTAL: 0.6 mg/dL (ref 0.2–1.2)
BUN/Creatinine Ratio: 14 (calc) (ref 6–22)
BUN: 17 mg/dL (ref 7–25)
CHLORIDE: 100 mmol/L (ref 98–110)
CO2: 30 mmol/L (ref 20–32)
Calcium: 10.4 mg/dL (ref 8.6–10.4)
Creat: 1.22 mg/dL — ABNORMAL HIGH (ref 0.60–0.93)
GFR, Est African American: 52 mL/min/{1.73_m2} — ABNORMAL LOW (ref >=60)
GFR, Est Non African American: 45 mL/min/{1.73_m2} — ABNORMAL LOW (ref >=60)
GLUCOSE: 120 mg/dL (ref 65–139)
Globulin: 2.6 g/dL (calc) (ref 1.9–3.7)
Potassium: 5 mmol/L (ref 3.5–5.3)
Sodium: 138 mmol/L (ref 135–146)
Total Protein: 6.9 g/dL (ref 6.1–8.1)

## 2018-05-14 LAB — TSH: TSH: 3.32 m[IU]/L (ref 0.40–4.50)

## 2018-05-14 LAB — T4, FREE: FREE T4: 1.5 ng/dL (ref 0.8–1.8)

## 2018-05-15 ENCOUNTER — Other Ambulatory Visit: Payer: Self-pay

## 2018-05-15 ENCOUNTER — Encounter: Payer: Self-pay | Admitting: "Endocrinology

## 2018-05-15 ENCOUNTER — Ambulatory Visit (INDEPENDENT_AMBULATORY_CARE_PROVIDER_SITE_OTHER): Payer: Medicare Other | Admitting: "Endocrinology

## 2018-05-15 DIAGNOSIS — I10 Essential (primary) hypertension: Secondary | ICD-10-CM | POA: Diagnosis not present

## 2018-05-15 DIAGNOSIS — E038 Other specified hypothyroidism: Secondary | ICD-10-CM

## 2018-05-15 DIAGNOSIS — E118 Type 2 diabetes mellitus with unspecified complications: Secondary | ICD-10-CM

## 2018-05-15 DIAGNOSIS — E782 Mixed hyperlipidemia: Secondary | ICD-10-CM | POA: Diagnosis not present

## 2018-05-15 NOTE — Progress Notes (Signed)
05/15/2018                                                     Endocrinology Telehealth Visit Follow up Note -During COVID -19 Pandemic  This visit type was conducted due to national recommendations for restrictions regarding the COVID-19 Pandemic  in an effort to limit this patient's exposure and mitigate transmission of the corona virus.  Due to her co-morbid illnesses, Dana Lawson is at  moderate to high risk for complications without adequate follow up.  This format is felt to be most appropriate for her at this time.  I connected with this patient on 05/15/2018   by telephone and verified that I am speaking with the correct person using two identifiers. Dana Lawson, 15-May-1945. she has verbally consented to this visit. All issues noted in this document were discussed and addressed. The format was not optimal for physical exam.    Subjective:    Patient ID: Dana Lawson, female    DOB: 15-May-1945, PCP Kirstie PeriShah, Ashish, MD   Past Medical History:  Diagnosis Date  . Anxiety   . COPD (chronic obstructive pulmonary disease) (HCC)   . Depression   . Essential hypertension   . GERD (gastroesophageal reflux disease)   . Hepatic steatosis   . Hyperlipidemia   . Hypothyroidism   . Type 2 diabetes mellitus (HCC)    Past Surgical History:  Procedure Laterality Date  . LEFT HEART CATH AND CORONARY ANGIOGRAPHY N/A 11/08/2016   Procedure: LEFT HEART CATH AND CORONARY ANGIOGRAPHY;  Surgeon: Marykay LexHarding, David W, MD;  [facility];  Service: Cardiovascular;  Laterality: N/A;  . TUBAL LIGATION     Social History   Socioeconomic History  . Marital status: Married    Spouse name: Not on file  . Number of children: Not on file  . Years of education: Not on file  . Highest education level: Not on file  Occupational History  . Not on file  Social Needs  . Financial resource strain: Not on file  . Food insecurity:    Worry: Not on file    Inability: Not on file  .  Transportation needs:    Medical: Not on file    Non-medical: Not on file  Tobacco Use  . Smoking status: Former Smoker    Types: Cigarettes    Last attempt to quit: 11/06/2015    Years since quitting: 2.5  . Smokeless tobacco: Never Used  Substance and Sexual Activity  . Alcohol use: Yes    Alcohol/week: 0.0 standard drinks    Comment: Occasional  . Drug use: No  . Sexual activity: Not on file  Lifestyle  . Physical activity:    Days per week: Not on file    Minutes per session: Not on file  . Stress: Not on file  Relationships  . Social connections:    Talks on phone: Not on file    Gets together: Not on file    Attends religious service: Not on file    Active member of club or organization: Not on file    Attends meetings of clubs or organizations: Not on file    Relationship status: Not on file  Other Topics Concern  . Not on file  Social History Narrative  . Not on file  Outpatient Encounter Medications as of 05/15/2018  Medication Sig  . aspirin 81 MG tablet Take 81 mg by mouth daily.  . benazepril (LOTENSIN) 40 MG tablet Take 40 mg by mouth daily.  Marland Kitchen buPROPion (WELLBUTRIN SR) 150 MG 12 hr tablet Take 150 mg by mouth 2 (two) times daily.  . furosemide (LASIX) 40 MG tablet Take 40 mg by mouth daily.   Marland Kitchen levothyroxine (SYNTHROID, LEVOTHROID) 150 MCG tablet Take 1 tablet (150 mcg total) by mouth daily before breakfast.  . metFORMIN (GLUCOPHAGE) 500 MG tablet Take 1 tablet (500 mg total) by mouth 2 (two) times daily after a meal.  . omeprazole (PRILOSEC) 20 MG capsule Take 20 mg by mouth daily.  . potassium chloride (K-DUR) 10 MEQ tablet Take 10 mEq by mouth daily.  . rosuvastatin (CRESTOR) 20 MG tablet Take 20 mg by mouth daily.   No facility-administered encounter medications on file as of 05/15/2018.    ALLERGIES: Allergies  Allergen Reactions  . Flagyl [Metronidazole]    VACCINATION STATUS:  There is no immunization history on file for this  patient.  Diabetes  She presents for her follow-up diabetic visit. She has type 2 diabetes mellitus. Onset time: She was diagnosed at approximate age of 49 years. Her disease course has been improving. There are no hypoglycemic associated symptoms. There are no diabetic associated symptoms. There are no hypoglycemic complications. Symptoms are improving. There are no diabetic complications. Risk factors for coronary artery disease include diabetes mellitus, dyslipidemia, hypertension, obesity, sedentary lifestyle, post-menopausal and tobacco exposure. Current diabetic treatment includes oral agent (monotherapy). She is following a generally unhealthy diet. When asked about meal planning, she reported none. She has not had a previous visit with a dietitian. She rarely participates in exercise. Her home blood glucose trend is decreasing steadily. An ACE inhibitor/angiotensin II receptor blocker is being taken. She does not see a podiatrist.Eye exam is not current.   Review of Systems   Limited as above.  Objective:    There were no vitals taken for this visit.  Wt Readings from Last 3 Encounters:  01/14/18 239 lb (108.4 kg)  09/16/17 247 lb (112 kg)  07/08/17 239 lb (108.4 kg)    Physical Exam   Recent Results (from the past 2160 hour(s))  Hemoglobin A1c     Status: Abnormal   Collection Time: 05/13/18 11:27 AM  Result Value Ref Range   Hgb A1c MFr Bld 6.8 (H) <5.7 % of total Hgb    Comment: For someone without known diabetes, a hemoglobin A1c value of 6.5% or greater indicates that they may have  diabetes and this should be confirmed with a follow-up  test. . For someone with known diabetes, a value <7% indicates  that their diabetes is well controlled and a value  greater than or equal to 7% indicates suboptimal  control. A1c targets should be individualized based on  duration of diabetes, age, comorbid conditions, and  other considerations. . Currently, no consensus exists  regarding use of hemoglobin A1c for diagnosis of diabetes for children. .    Mean Plasma Glucose 148 (calc)   eAG (mmol/L) 8.2 (calc)  COMPLETE METABOLIC PANEL WITH GFR     Status: Abnormal   Collection Time: 05/13/18 11:27 AM  Result Value Ref Range   Glucose, Bld 120 65 - 139 mg/dL    Comment: .        Non-fasting reference interval .    BUN 17 7 - 25 mg/dL   Creat  1.22 (H) 0.60 - 0.93 mg/dL    Comment: For patients >38 years of age, the reference limit for Creatinine is approximately 13% higher for people identified as African-American. .    GFR, Est Non African American 45 (L) > OR = 60 mL/min/1.79m2   GFR, Est African American 52 (L) > OR = 60 mL/min/1.76m2   BUN/Creatinine Ratio 14 6 - 22 (calc)   Sodium 138 135 - 146 mmol/L   Potassium 5.0 3.5 - 5.3 mmol/L   Chloride 100 98 - 110 mmol/L   CO2 30 20 - 32 mmol/L   Calcium 10.4 8.6 - 10.4 mg/dL   Total Protein 6.9 6.1 - 8.1 g/dL   Albumin 4.3 3.6 - 5.1 g/dL   Globulin 2.6 1.9 - 3.7 g/dL (calc)   AG Ratio 1.7 1.0 - 2.5 (calc)   Total Bilirubin 0.6 0.2 - 1.2 mg/dL   Alkaline phosphatase (APISO) 81 37 - 153 U/L   AST 30 10 - 35 U/L   ALT 27 6 - 29 U/L  T4, free     Status: None   Collection Time: 05/13/18 11:27 AM  Result Value Ref Range   Free T4 1.5 0.8 - 1.8 ng/dL  TSH     Status: None   Collection Time: 05/13/18 11:27 AM  Result Value Ref Range   TSH 3.32 0.40 - 4.50 mIU/L     Assessment & Plan:   1. Hypothyroidism -Her thyroid function tests are consistent with appropriate replacement.  She is advised to continue levothyroxine 150 mcg p.o. every morning.    - We discussed about the correct intake of her thyroid hormone, on empty stomach at fasting, with water, separated by at least 30 minutes from breakfast and other medications,  and separated by more than 4 hours from calcium, iron, multivitamins, acid reflux medications (PPIs). -Patient is made aware of the fact that thyroid hormone replacement is needed  for life, dose to be adjusted by periodic monitoring of thyroid function tests.  2.  Type 2 diabetes-new diagnosis for her.    -Her previsit labs show A1c of 6.8%, progressively improving from 8.4%.  Her renal function is improving.  She is advised to continue metformin 500 mg p.o. twice a day- daily after breakfast and supper.    - Patient admits there is a room for improvement in her diet and drink choices. -  Suggestion is made for her to avoid simple carbohydrates  from her diet including Cakes, Sweet Desserts / Pastries, Ice Cream, Soda (diet and regular), Sweet Tea, Candies, Chips, Cookies, Store Bought Juices, Alcohol in Excess of  1-2 drinks a day, Artificial Sweeteners, and "Sugar-free" Products. This will help patient to have stable blood glucose profile and potentially avoid unintended weight gain.  3) hypertension: she is advised to home monitor blood pressure and report if > 140/90 on 2 separate readings.  -She is advised to be consistent in taking her blood pressure medications including benazepril 40 mg p.o. daily, metoprolol 50 mg p.o. twice daily and Lasix as needed.  4) hyperlipidemia: She does not have recent fasting lipid panel.  She is advised to continue Crestor 20 mg p.o. nightly.  Side effects and precautions discussed with her.  5) Multinodular goiter : -Her surveillance thyroid ultrasound is unremarkable with stable thyroid lobe size and unchanged  nodules from last exam.  She will not need any intervention at this time.    - I advised patient to maintain close follow up with Kirstie Peri,  MD for primary care needs.  - Patient Care Time Today:  25 min, of which >50% was spent in reviewing her  current and  previous labs/studies, previous treatments, and medications doses and developing a plan for long-term care based on the latest recommendations for standards of care.  Dana Lloyd participated in the discussions, expressed understanding, and voiced agreement with  the above plans.  All questions were answered to her satisfaction. she is encouraged to contact clinic should she have any questions or concerns prior to her return visit.  Follow up plan: Return in about 6 months (around 11/15/2018).  Marquis Lunch, MD Phone: [phone]  Fax: [phone]  This note was partially dictated with voice recognition software. Similar sounding words can be transcribed inadequately or may not  be corrected upon review.  05/15/2018, 2:31 PM

## 2018-05-20 ENCOUNTER — Ambulatory Visit: Payer: TRICARE For Life (TFL) | Admitting: "Endocrinology

## 2018-05-21 DIAGNOSIS — J449 Chronic obstructive pulmonary disease, unspecified: Secondary | ICD-10-CM | POA: Diagnosis not present

## 2018-05-21 DIAGNOSIS — I739 Peripheral vascular disease, unspecified: Secondary | ICD-10-CM | POA: Diagnosis not present

## 2018-05-21 DIAGNOSIS — Z6836 Body mass index (BMI) 36.0-36.9, adult: Secondary | ICD-10-CM | POA: Diagnosis not present

## 2018-05-21 DIAGNOSIS — Z299 Encounter for prophylactic measures, unspecified: Secondary | ICD-10-CM | POA: Diagnosis not present

## 2018-05-21 DIAGNOSIS — F17200 Nicotine dependence, unspecified, uncomplicated: Secondary | ICD-10-CM | POA: Diagnosis not present

## 2018-05-21 DIAGNOSIS — I10 Essential (primary) hypertension: Secondary | ICD-10-CM | POA: Diagnosis not present

## 2018-05-21 DIAGNOSIS — E1165 Type 2 diabetes mellitus with hyperglycemia: Secondary | ICD-10-CM | POA: Diagnosis not present

## 2018-06-05 DIAGNOSIS — E119 Type 2 diabetes mellitus without complications: Secondary | ICD-10-CM | POA: Diagnosis not present

## 2018-06-05 DIAGNOSIS — J449 Chronic obstructive pulmonary disease, unspecified: Secondary | ICD-10-CM | POA: Diagnosis not present

## 2018-06-05 DIAGNOSIS — E7800 Pure hypercholesterolemia, unspecified: Secondary | ICD-10-CM | POA: Diagnosis not present

## 2018-06-05 DIAGNOSIS — M069 Rheumatoid arthritis, unspecified: Secondary | ICD-10-CM | POA: Diagnosis not present

## 2018-06-20 DIAGNOSIS — E7800 Pure hypercholesterolemia, unspecified: Secondary | ICD-10-CM | POA: Diagnosis not present

## 2018-06-20 DIAGNOSIS — J449 Chronic obstructive pulmonary disease, unspecified: Secondary | ICD-10-CM | POA: Diagnosis not present

## 2018-06-20 DIAGNOSIS — M069 Rheumatoid arthritis, unspecified: Secondary | ICD-10-CM | POA: Diagnosis not present

## 2018-06-20 DIAGNOSIS — E119 Type 2 diabetes mellitus without complications: Secondary | ICD-10-CM | POA: Diagnosis not present

## 2018-07-29 DIAGNOSIS — J449 Chronic obstructive pulmonary disease, unspecified: Secondary | ICD-10-CM | POA: Diagnosis not present

## 2018-07-29 DIAGNOSIS — E119 Type 2 diabetes mellitus without complications: Secondary | ICD-10-CM | POA: Diagnosis not present

## 2018-07-29 DIAGNOSIS — E7800 Pure hypercholesterolemia, unspecified: Secondary | ICD-10-CM | POA: Diagnosis not present

## 2018-07-29 DIAGNOSIS — M069 Rheumatoid arthritis, unspecified: Secondary | ICD-10-CM | POA: Diagnosis not present

## 2018-08-21 DIAGNOSIS — Z299 Encounter for prophylactic measures, unspecified: Secondary | ICD-10-CM | POA: Diagnosis not present

## 2018-08-21 DIAGNOSIS — E1165 Type 2 diabetes mellitus with hyperglycemia: Secondary | ICD-10-CM | POA: Diagnosis not present

## 2018-08-21 DIAGNOSIS — J449 Chronic obstructive pulmonary disease, unspecified: Secondary | ICD-10-CM | POA: Diagnosis not present

## 2018-08-21 DIAGNOSIS — R600 Localized edema: Secondary | ICD-10-CM | POA: Diagnosis not present

## 2018-08-21 DIAGNOSIS — I10 Essential (primary) hypertension: Secondary | ICD-10-CM | POA: Diagnosis not present

## 2018-08-21 DIAGNOSIS — Z6836 Body mass index (BMI) 36.0-36.9, adult: Secondary | ICD-10-CM | POA: Diagnosis not present

## 2018-10-03 ENCOUNTER — Telehealth: Payer: Self-pay

## 2018-10-03 ENCOUNTER — Telehealth: Payer: Self-pay | Admitting: "Endocrinology

## 2018-10-03 MED ORDER — METFORMIN HCL 500 MG PO TABS: 500.0000 mg | ORAL_TABLET | Freq: Two times a day (BID) | ORAL | 6 refills | Status: DC

## 2018-10-03 NOTE — Telephone Encounter (Signed)
Done

## 2018-10-03 NOTE — Telephone Encounter (Signed)
LeighAnn Tylik Treese, CMA  

## 2018-10-03 NOTE — Telephone Encounter (Signed)
Patient is requesting a refill on metFORMIN (GLUCOPHAGE) 500 MG tablet.  [place], [place]

## 2018-11-10 DIAGNOSIS — Z23 Encounter for immunization: Secondary | ICD-10-CM | POA: Diagnosis not present

## 2018-11-11 DIAGNOSIS — E038 Other specified hypothyroidism: Secondary | ICD-10-CM | POA: Diagnosis not present

## 2018-11-11 DIAGNOSIS — E118 Type 2 diabetes mellitus with unspecified complications: Secondary | ICD-10-CM | POA: Diagnosis not present

## 2018-11-11 DIAGNOSIS — E559 Vitamin D deficiency, unspecified: Secondary | ICD-10-CM | POA: Diagnosis not present

## 2018-11-12 LAB — COMPLETE METABOLIC PANEL WITH GFR
AG Ratio: 1.6 (calc) (ref 1.0–2.5)
ALT: 26 U/L (ref 6–29)
AST: 29 U/L (ref 10–35)
Albumin: 4.3 g/dL (ref 3.6–5.1)
Alkaline phosphatase (APISO): 96 U/L (ref 37–153)
BUN/Creatinine Ratio: 16 (calc) (ref 6–22)
BUN: 19 mg/dL (ref 7–25)
CO2: 29 mmol/L (ref 20–32)
Calcium: 9.7 mg/dL (ref 8.6–10.4)
Chloride: 102 mmol/L (ref 98–110)
Creat: 1.17 mg/dL — ABNORMAL HIGH (ref 0.60–0.93)
GFR, Est African American: 55 mL/min/{1.73_m2} — ABNORMAL LOW (ref >=60)
GFR, Est Non African American: 47 mL/min/{1.73_m2} — ABNORMAL LOW (ref >=60)
Globulin: 2.7 g/dL (calc) (ref 1.9–3.7)
Glucose, Bld: 94 mg/dL (ref 65–139)
Potassium: 4.9 mmol/L (ref 3.5–5.3)
Sodium: 139 mmol/L (ref 135–146)
Total Bilirubin: 0.4 mg/dL (ref 0.2–1.2)
Total Protein: 7 g/dL (ref 6.1–8.1)

## 2018-11-12 LAB — T4, FREE: Free T4: 1.3 ng/dL (ref 0.8–1.8)

## 2018-11-12 LAB — TSH: TSH: 2.62 mIU/L (ref 0.40–4.50)

## 2018-11-12 LAB — VITAMIN D 25 HYDROXY (VIT D DEFICIENCY, FRACTURES): Vit D, 25-Hydroxy: 21 ng/mL — ABNORMAL LOW (ref 30–100)

## 2018-11-12 LAB — HEMOGLOBIN A1C
Hgb A1c MFr Bld: 7.8 % of total Hgb — ABNORMAL HIGH (ref <5.7)
Mean Plasma Glucose: 177 (calc)
eAG (mmol/L): 9.8 (calc)

## 2018-11-17 ENCOUNTER — Ambulatory Visit (INDEPENDENT_AMBULATORY_CARE_PROVIDER_SITE_OTHER): Payer: Medicare Other | Admitting: "Endocrinology

## 2018-11-17 ENCOUNTER — Encounter: Payer: Self-pay | Admitting: "Endocrinology

## 2018-11-17 ENCOUNTER — Other Ambulatory Visit: Payer: Self-pay

## 2018-11-17 DIAGNOSIS — E038 Other specified hypothyroidism: Secondary | ICD-10-CM | POA: Diagnosis not present

## 2018-11-17 DIAGNOSIS — E118 Type 2 diabetes mellitus with unspecified complications: Secondary | ICD-10-CM | POA: Diagnosis not present

## 2018-11-17 DIAGNOSIS — E782 Mixed hyperlipidemia: Secondary | ICD-10-CM

## 2018-11-17 DIAGNOSIS — I10 Essential (primary) hypertension: Secondary | ICD-10-CM

## 2018-11-17 MED ORDER — VITAMIN D3 125 MCG (5000 UT) PO CAPS: 5000.0000 [IU] | ORAL_CAPSULE | Freq: Every day | ORAL | 0 refills | Status: DC

## 2018-11-17 MED ORDER — METFORMIN HCL 500 MG PO TABS: ORAL_TABLET | ORAL | 3 refills | Status: DC

## 2018-11-17 NOTE — Patient Instructions (Signed)

## 2018-11-17 NOTE — Progress Notes (Signed)
11/17/2018                                                     Endocrinology Telehealth Visit Follow up Note -During COVID -19 Pandemic  This visit type was conducted due to national recommendations for restrictions regarding the COVID-19 Pandemic  in an effort to limit this patient's exposure and mitigate transmission of the corona virus.  Due to her co-morbid illnesses, Dana Lawson is at  moderate to high risk for complications without adequate follow up.  This format is felt to be most appropriate for her at this time.  I connected with this patient on 11/17/2018   by telephone and verified that I am speaking with the correct person using two identifiers. Dana Lawson, Feb 27, 1945. she has verbally consented to this visit. All issues noted in this document were discussed and addressed. The format was not optimal for physical exam.    Subjective:    Patient ID: Dana Lawson, female    DOB: [birth year], PCP Kirstie Peri, MD   Past Medical History:  Diagnosis Date  . Anxiety   . COPD (chronic obstructive pulmonary disease) (HCC)   . Depression   . Essential hypertension   . GERD (gastroesophageal reflux disease)   . Hepatic steatosis   . Hyperlipidemia   . Hypothyroidism   . Type 2 diabetes mellitus (HCC)    Past Surgical History:  Procedure Laterality Date  . LEFT HEART CATH AND CORONARY ANGIOGRAPHY N/A 11/08/2016   Procedure: LEFT HEART CATH AND CORONARY ANGIOGRAPHY;  Surgeon: Marykay Lex, MD;  Location: [facility] INVASIVE CV LAB;  Service: Cardiovascular;  Laterality: N/A;  . TUBAL LIGATION     Social History   Socioeconomic History  . Marital status: Married    Spouse name: Not on file  . Number of children: Not on file  . Years of education: Not on file  . Highest education level: Not on file  Occupational History  . Not on file  Social Needs  . Financial resource strain: Not on file  . Food insecurity    Worry: Not on file    Inability: Not on file  .  Transportation needs    Medical: Not on file    Non-medical: Not on file  Tobacco Use  . Smoking status: Former Smoker    Types: Cigarettes    Quit date: 11/06/2015    Years since quitting: 3.0  . Smokeless tobacco: Never Used  Substance and Sexual Activity  . Alcohol use: Yes    Alcohol/week: 0.0 standard drinks    Comment: Occasional  . Drug use: No  . Sexual activity: Not on file  Lifestyle  . Physical activity    Days per week: Not on file    Minutes per session: Not on file  . Stress: Not on file  Relationships  . Social Musician on phone: Not on file    Gets together: Not on file    Attends religious service: Not on file    Active member of club or organization: Not on file    Attends meetings of clubs or organizations: Not on file    Relationship status: Not on file  Other Topics Concern  . Not on file  Social History Narrative  . Not on file   Outpatient  Encounter Medications as of 11/17/2018  Medication Sig  . aspirin 81 MG tablet Take 81 mg by mouth daily.  . benazepril (LOTENSIN) 40 MG tablet Take 40 mg by mouth daily.  Marland Kitchen buPROPion (WELLBUTRIN SR) 150 MG 12 hr tablet Take 150 mg by mouth 2 (two) times daily.  . Cholecalciferol (VITAMIN D3) 125 MCG (5000 UT) CAPS Take 1 capsule (5,000 Units total) by mouth daily.  . furosemide (LASIX) 40 MG tablet Take 40 mg by mouth daily.   Marland Kitchen levothyroxine (SYNTHROID, LEVOTHROID) 150 MCG tablet Take 1 tablet (150 mcg total) by mouth daily before breakfast.  . metFORMIN (GLUCOPHAGE) 500 MG tablet Take 2 tablets after supper and 1 tablet after breakfast  . omeprazole (PRILOSEC) 20 MG capsule Take 20 mg by mouth daily.  . potassium chloride (K-DUR) 10 MEQ tablet Take 10 mEq by mouth daily.  . rosuvastatin (CRESTOR) 20 MG tablet Take 20 mg by mouth daily.  . [DISCONTINUED] metFORMIN (GLUCOPHAGE) 500 MG tablet Take 1 tablet (500 mg total) by mouth 2 (two) times daily after a meal.   No facility-administered encounter  medications on file as of 11/17/2018.    ALLERGIES: Allergies  Allergen Reactions  . Flagyl [Metronidazole]    VACCINATION STATUS:  There is no immunization history on file for this patient.  Diabetes She presents for her follow-up diabetic visit. She has type 2 diabetes mellitus. Onset time: She was diagnosed at approximate age of 38 years. Her disease course has been worsening. There are no hypoglycemic associated symptoms. There are no diabetic associated symptoms. There are no hypoglycemic complications. Symptoms are worsening. There are no diabetic complications. Risk factors for coronary artery disease include diabetes mellitus, dyslipidemia, hypertension, obesity, sedentary lifestyle, post-menopausal and tobacco exposure. Current diabetic treatment includes oral agent (monotherapy). She is following a generally unhealthy diet. When asked about meal planning, she reported none. She has not had a previous visit with a dietitian. She rarely participates in exercise. Her home blood glucose trend is decreasing steadily. An ACE inhibitor/angiotensin II receptor blocker is being taken. She does not see a podiatrist.Eye exam is not current.   Review of Systems   Limited as above.  Objective:    There were no vitals taken for this visit.  Wt Readings from Last 3 Encounters:  01/14/18 239 lb (108.4 kg)  09/16/17 247 lb (112 kg)  07/08/17 239 lb (108.4 kg)    Physical Exam   Recent Results (from the past 2160 hour(s))  Hemoglobin A1c     Status: Abnormal   Collection Time: 11/11/18  2:13 PM  Result Value Ref Range   Hgb A1c MFr Bld 7.8 (H) <5.7 % of total Hgb    Comment: For someone without known diabetes, a hemoglobin A1c value of 6.5% or greater indicates that they may have  diabetes and this should be confirmed with a follow-up  test. . For someone with known diabetes, a value <7% indicates  that their diabetes is well controlled and a value  greater than or equal to 7%  indicates suboptimal  control. A1c targets should be individualized based on  duration of diabetes, age, comorbid conditions, and  other considerations. . Currently, no consensus exists regarding use of hemoglobin A1c for diagnosis of diabetes for children. .    Mean Plasma Glucose 177 (calc)   eAG (mmol/L) 9.8 (calc)  COMPLETE METABOLIC PANEL WITH GFR     Status: Abnormal   Collection Time: 11/11/18  2:13 PM  Result Value Ref Range  Glucose, Bld 94 65 - 139 mg/dL    Comment: .        Non-fasting reference interval .    BUN 19 7 - 25 mg/dL   Creat 4.781.17 (H) 2.950.60 - 0.93 mg/dL    Comment: For patients >71 years of age, the reference limit for Creatinine is approximately 13% higher for people identified as African-American. .    GFR, Est Non African American 47 (L) > OR = 60 mL/min/1.8773m2   GFR, Est African American 55 (L) > OR = 60 mL/min/1.173m2   BUN/Creatinine Ratio 16 6 - 22 (calc)   Sodium 139 135 - 146 mmol/L   Potassium 4.9 3.5 - 5.3 mmol/L   Chloride 102 98 - 110 mmol/L   CO2 29 20 - 32 mmol/L   Calcium 9.7 8.6 - 10.4 mg/dL   Total Protein 7.0 6.1 - 8.1 g/dL   Albumin 4.3 3.6 - 5.1 g/dL   Globulin 2.7 1.9 - 3.7 g/dL (calc)   AG Ratio 1.6 1.0 - 2.5 (calc)   Total Bilirubin 0.4 0.2 - 1.2 mg/dL   Alkaline phosphatase (APISO) 96 37 - 153 U/L   AST 29 10 - 35 U/L   ALT 26 6 - 29 U/L  TSH     Status: None   Collection Time: 11/11/18  2:13 PM  Result Value Ref Range   TSH 2.62 0.40 - 4.50 mIU/L  T4, free     Status: None   Collection Time: 11/11/18  2:13 PM  Result Value Ref Range   Free T4 1.3 0.8 - 1.8 ng/dL  VITAMIN D 25 Hydroxy (Vit-D Deficiency, Fractures)     Status: Abnormal   Collection Time: 11/11/18  2:13 PM  Result Value Ref Range   Vit D, 25-Hydroxy 21 (L) 30 - 100 ng/mL    Comment: Vitamin D Status         25-OH Vitamin D: . Deficiency:                    <20 ng/mL Insufficiency:             20 - 29 ng/mL Optimal:                 > or = 30  ng/mL . For 25-OH Vitamin D testing on patients on  D2-supplementation and patients for whom quantitation  of D2 and D3 fractions is required, the [facility](TM) 25-OH VIT D, (D2,D3), LC/MS/MS is recommended: order  code [id number] (patients >2237yrs). See Note 1 . Note 1 . For additional information, please refer to  http://education.[facility].com/faq/FAQ199  (This link is being provided for informational/ educational purposes only.)      Assessment & Plan:   1. Hypothyroidism -Her thyroid function tests are consistent with appropriate replacement.  She is advised to continue levothyroxine 150 mcg p.o. every morning.    - We discussed about the correct intake of her thyroid hormone, on empty stomach at fasting, with water, separated by at least 30 minutes from breakfast and other medications,  and separated by more than 4 hours from calcium, iron, multivitamins, acid reflux medications (PPIs). -Patient is made aware of the fact that thyroid hormone replacement is needed for life, dose to be adjusted by periodic monitoring of thyroid function tests.   2.  Type 2 diabetes-new diagnosis for her.    -Her previsit labs show A1c of 7.8% increasing from 6.8%.  Her renal function is improving.  She is advised to increase Metformin  to 1000 mg daily after supper and 500 mg daily after breakfast.    - she  admits there is a room for improvement in her diet and drink choices. -  Suggestion is made for her to avoid simple carbohydrates  from her diet including Cakes, Sweet Desserts / Pastries, Ice Cream, Soda (diet and regular), Sweet Tea, Candies, Chips, Cookies, Sweet Pastries,  Store Bought Juices, Alcohol in Excess of  1-2 drinks a day, Artificial Sweeteners, Coffee Creamer, and "Sugar-free" Products. This will help patient to have stable blood glucose profile and potentially avoid unintended weight gain.   3) hypertension:  she is advised to home monitor blood pressure and report if >  140/90 on 2 separate readings. -She is advised to be consistent in taking her blood pressure medications including benazepril 40 mg p.o. daily, metoprolol 50 mg p.o. twice daily and Lasix as needed.  4) hyperlipidemia: She does not have recent fasting lipid panel.  She is advised to continue Crestor 20 mg p.o. nightly.  Side effects and precautions discussed with her.  5) Multinodular goiter : -Her surveillance thyroid ultrasound is unremarkable with stable thyroid lobe size and unchanged  nodules from last exam.  She will not need any intervention at this time.  She will be considered for surveillance repeat thyroid ultrasound after her next visit.  6) vitamin D deficiency  I discussed initiated vitamin D3 5000 units daily x90 days.  - I advised patient to maintain close follow up with Monico Blitz, MD for primary care needs.  - Patient Care Time Today:  25 min, of which >50% was spent in  counseling and the rest reviewing her  current and  previous labs/studies, previous treatments, her blood glucose readings, and medications' doses and developing a plan for long-term care based on the latest recommendations for standards of care.   Cline Crock participated in the discussions, expressed understanding, and voiced agreement with the above plans.  All questions were answered to her satisfaction. she is encouraged to contact clinic should she have any questions or concerns prior to her return visit.   Follow up plan: Return in about 4 months (around 03/17/2019) for Follow up with Pre-visit Labs.  Dana Lloyd, MD Phone: [phone]  Fax: [phone]  This note was partially dictated with voice recognition software. Similar sounding words can be transcribed inadequately or may not  be corrected upon review.  11/17/2018, 1:47 PM

## 2018-11-25 DIAGNOSIS — Z1331 Encounter for screening for depression: Secondary | ICD-10-CM | POA: Diagnosis not present

## 2018-11-25 DIAGNOSIS — Z299 Encounter for prophylactic measures, unspecified: Secondary | ICD-10-CM | POA: Diagnosis not present

## 2018-11-25 DIAGNOSIS — Z1211 Encounter for screening for malignant neoplasm of colon: Secondary | ICD-10-CM | POA: Diagnosis not present

## 2018-11-25 DIAGNOSIS — R5383 Other fatigue: Secondary | ICD-10-CM | POA: Diagnosis not present

## 2018-11-25 DIAGNOSIS — E039 Hypothyroidism, unspecified: Secondary | ICD-10-CM | POA: Diagnosis not present

## 2018-11-25 DIAGNOSIS — Z0000 Encounter for general adult medical examination without abnormal findings: Secondary | ICD-10-CM | POA: Diagnosis not present

## 2018-11-25 DIAGNOSIS — Z6837 Body mass index (BMI) 37.0-37.9, adult: Secondary | ICD-10-CM | POA: Diagnosis not present

## 2018-11-25 DIAGNOSIS — I10 Essential (primary) hypertension: Secondary | ICD-10-CM | POA: Diagnosis not present

## 2018-11-25 DIAGNOSIS — Z7189 Other specified counseling: Secondary | ICD-10-CM | POA: Diagnosis not present

## 2018-11-25 DIAGNOSIS — Z1339 Encounter for screening examination for other mental health and behavioral disorders: Secondary | ICD-10-CM | POA: Diagnosis not present

## 2018-11-25 DIAGNOSIS — E7800 Pure hypercholesterolemia, unspecified: Secondary | ICD-10-CM | POA: Diagnosis not present

## 2018-11-25 DIAGNOSIS — Z79899 Other long term (current) drug therapy: Secondary | ICD-10-CM | POA: Diagnosis not present

## 2018-11-27 ENCOUNTER — Other Ambulatory Visit: Payer: Self-pay

## 2018-11-27 ENCOUNTER — Telehealth: Payer: Self-pay | Admitting: *Deleted

## 2018-11-27 DIAGNOSIS — Z20822 Contact with and (suspected) exposure to covid-19: Secondary | ICD-10-CM

## 2018-11-27 NOTE — Telephone Encounter (Signed)
Pt calling to question when her covid results may be back; tested today, advised 3-5 days.

## 2018-11-29 LAB — NOVEL CORONAVIRUS, NAA: SARS-CoV-2, NAA: NOT DETECTED

## 2018-12-01 ENCOUNTER — Telehealth: Payer: Self-pay | Admitting: *Deleted

## 2018-12-01 NOTE — Telephone Encounter (Signed)
Pt given result of COVID test; she verbalized understanding. 

## 2019-01-15 DIAGNOSIS — M069 Rheumatoid arthritis, unspecified: Secondary | ICD-10-CM | POA: Diagnosis not present

## 2019-01-15 DIAGNOSIS — E119 Type 2 diabetes mellitus without complications: Secondary | ICD-10-CM | POA: Diagnosis not present

## 2019-01-15 DIAGNOSIS — E7800 Pure hypercholesterolemia, unspecified: Secondary | ICD-10-CM | POA: Diagnosis not present

## 2019-01-15 DIAGNOSIS — J449 Chronic obstructive pulmonary disease, unspecified: Secondary | ICD-10-CM | POA: Diagnosis not present

## 2019-01-20 ENCOUNTER — Other Ambulatory Visit: Payer: Self-pay | Admitting: "Endocrinology

## 2019-02-24 DIAGNOSIS — E2839 Other primary ovarian failure: Secondary | ICD-10-CM | POA: Diagnosis not present

## 2019-03-09 DIAGNOSIS — M069 Rheumatoid arthritis, unspecified: Secondary | ICD-10-CM | POA: Diagnosis not present

## 2019-03-09 DIAGNOSIS — E7800 Pure hypercholesterolemia, unspecified: Secondary | ICD-10-CM | POA: Diagnosis not present

## 2019-03-09 DIAGNOSIS — J449 Chronic obstructive pulmonary disease, unspecified: Secondary | ICD-10-CM | POA: Diagnosis not present

## 2019-03-09 DIAGNOSIS — E119 Type 2 diabetes mellitus without complications: Secondary | ICD-10-CM | POA: Diagnosis not present

## 2019-03-13 DIAGNOSIS — E038 Other specified hypothyroidism: Secondary | ICD-10-CM | POA: Diagnosis not present

## 2019-03-13 DIAGNOSIS — E118 Type 2 diabetes mellitus with unspecified complications: Secondary | ICD-10-CM | POA: Diagnosis not present

## 2019-03-14 ENCOUNTER — Ambulatory Visit: Payer: Medicare Other | Attending: Internal Medicine

## 2019-03-14 DIAGNOSIS — Z23 Encounter for immunization: Secondary | ICD-10-CM

## 2019-03-14 LAB — T4, FREE: Free T4: 1.5 ng/dL (ref 0.8–1.8)

## 2019-03-14 LAB — LIPID PANEL
Cholesterol: 211 mg/dL — ABNORMAL HIGH (ref <200)
HDL: 46 mg/dL — ABNORMAL LOW (ref >=50)
Non-HDL Cholesterol (Calc): 165 mg/dL (calc) — ABNORMAL HIGH (ref <130)
Total CHOL/HDL Ratio: 4.6 (calc) (ref <5.0)
Triglycerides: 412 mg/dL — ABNORMAL HIGH (ref <150)

## 2019-03-14 LAB — TSH: TSH: 3.81 mIU/L (ref 0.40–4.50)

## 2019-03-14 NOTE — Progress Notes (Signed)
   Covid-19 Vaccination Clinic  Name:  Dana Lawson    MRN: [id number] DOB: 06-20-1945  03/14/2019  Ms. [name] was observed post Covid-19 immunization for 15 minutes without incident. She was provided with Vaccine Information Sheet and instruction to access the V-Safe system.   Ms. [name] was instructed to call 911 with any severe reactions post vaccine: Marland Kitchen Difficulty breathing  . Swelling of face and throat  . A fast heartbeat  . A bad rash all over body  . Dizziness and weakness   Immunizations Administered    Name Date Dose VIS Date Route   Moderna COVID-19 Vaccine 03/14/2019  8:58 AM 0.5 mL 12/09/2018 Intramuscular   Manufacturer: Moderna   Lot: [id number]   NDC: 36859-923-41

## 2019-03-19 ENCOUNTER — Ambulatory Visit (INDEPENDENT_AMBULATORY_CARE_PROVIDER_SITE_OTHER): Payer: Medicare Other | Admitting: "Endocrinology

## 2019-03-19 ENCOUNTER — Other Ambulatory Visit: Payer: Self-pay

## 2019-03-19 ENCOUNTER — Encounter: Payer: Self-pay | Admitting: "Endocrinology

## 2019-03-19 VITALS — BP 139/77 | HR 84 | Ht 67.0 in | Wt 235.8 lb

## 2019-03-19 DIAGNOSIS — I10 Essential (primary) hypertension: Secondary | ICD-10-CM | POA: Diagnosis not present

## 2019-03-19 DIAGNOSIS — E038 Other specified hypothyroidism: Secondary | ICD-10-CM | POA: Diagnosis not present

## 2019-03-19 DIAGNOSIS — E782 Mixed hyperlipidemia: Secondary | ICD-10-CM

## 2019-03-19 DIAGNOSIS — E118 Type 2 diabetes mellitus with unspecified complications: Secondary | ICD-10-CM

## 2019-03-19 LAB — POCT GLYCOSYLATED HEMOGLOBIN (HGB A1C): Hemoglobin A1C: 8.3 % — AB (ref 4.0–5.6)

## 2019-03-19 MED ORDER — GLIPIZIDE ER 5 MG PO TB24: 5.0000 mg | ORAL_TABLET | Freq: Every day | ORAL | 1 refills | Status: DC

## 2019-03-19 MED ORDER — FENOFIBRATE 48 MG PO TABS: 48.0000 mg | ORAL_TABLET | Freq: Every day | ORAL | 1 refills | Status: DC

## 2019-03-19 NOTE — Patient Instructions (Signed)

## 2019-03-19 NOTE — Progress Notes (Signed)
03/19/2019   Endocrinology follow-up note   Subjective:    Patient ID: Dana Lawson, female    DOB: 14-Aug-1945, PCP Kirstie Peri, MD   Past Medical History:  Diagnosis Date  . Anxiety   . COPD (chronic obstructive pulmonary disease) (HCC)   . Depression   . Essential hypertension   . GERD (gastroesophageal reflux disease)   . Hepatic steatosis   . Hyperlipidemia   . Hypothyroidism   . Type 2 diabetes mellitus (HCC)    Past Surgical History:  Procedure Laterality Date  . LEFT HEART CATH AND CORONARY ANGIOGRAPHY N/A 11/08/2016   Procedure: LEFT HEART CATH AND CORONARY ANGIOGRAPHY;  Surgeon: Marykay Lex, MD;  Location: [facility] INVASIVE CV LAB;  Service: Cardiovascular;  Laterality: N/A;  . TUBAL LIGATION     Social History   Socioeconomic History  . Marital status: Married    Spouse name: Not on file  . Number of children: Not on file  . Years of education: Not on file  . Highest education level: Not on file  Occupational History  . Not on file  Tobacco Use  . Smoking status: Former Smoker    Types: Cigarettes    Quit date: 11/06/2015    Years since quitting: 3.3  . Smokeless tobacco: Never Used  Substance and Sexual Activity  . Alcohol use: Yes    Alcohol/week: 0.0 standard drinks    Comment: Occasional  . Drug use: No  . Sexual activity: Not on file  Other Topics Concern  . Not on file  Social History Narrative  . Not on file   Social Determinants of Health   Financial Resource Strain:   . Difficulty of Paying Living Expenses:   Food Insecurity:   . Worried About Programme researcher, broadcasting/film/video in the Last Year:   . Barista in the Last Year:   Transportation Needs:   . Freight forwarder (Medical):   Marland Kitchen Lack of Transportation (Non-Medical):   Physical Activity:   . Days of Exercise per Week:   . Minutes of Exercise per Session:   Stress:   . Feeling of Stress :   Social Connections:   . Frequency of Communication with Friends and Family:   .  Frequency of Social Gatherings with Friends and Family:   . Attends Religious Services:   . Active Member of Clubs or Organizations:   . Attends Banker Meetings:   Marland Kitchen Marital Status:    Outpatient Encounter Medications as of 03/19/2019  Medication Sig  . aspirin 81 MG tablet Take 81 mg by mouth daily.  . benazepril (LOTENSIN) 40 MG tablet Take 40 mg by mouth daily.  Marland Kitchen buPROPion (WELLBUTRIN SR) 150 MG 12 hr tablet Take 150 mg by mouth 2 (two) times daily.  . Cholecalciferol (VITAMIN D3) 125 MCG (5000 UT) CAPS Take 1 capsule (5,000 Units total) by mouth daily.  . fenofibrate (TRICOR) 48 MG tablet Take 1 tablet (48 mg total) by mouth daily.  . furosemide (LASIX) 40 MG tablet Take 40 mg by mouth daily.   Marland Kitchen glipiZIDE (GLUCOTROL XL) 5 MG 24 hr tablet Take 1 tablet (5 mg total) by mouth daily with breakfast.  . metFORMIN (GLUCOPHAGE) 500 MG tablet Take 2 tablets after supper and 1 tablet after breakfast  . omeprazole (PRILOSEC) 20 MG capsule Take 20 mg by mouth daily.  . potassium chloride (K-DUR) 10 MEQ tablet Take 10 mEq by mouth daily.  . rosuvastatin (CRESTOR) 20 MG  tablet Take 20 mg by mouth daily.  Marland Kitchen SYNTHROID 150 MCG tablet TAKE 1 TABLET DAILY BEFORE BREAKFAST   No facility-administered encounter medications on file as of 03/19/2019.   ALLERGIES: Allergies  Allergen Reactions  . Flagyl [Metronidazole]    VACCINATION STATUS: Immunization History  Administered Date(s) Administered  . Moderna SARS-COVID-2 Vaccination 03/14/2019    Diabetes She presents for her follow-up diabetic visit. She has type 2 diabetes mellitus. Onset time: She was diagnosed at approximate age of 8 years. Her disease course has been worsening. There are no hypoglycemic associated symptoms. There are no diabetic associated symptoms. There are no hypoglycemic complications. Symptoms are worsening. There are no diabetic complications. Risk factors for coronary artery disease include diabetes mellitus,  dyslipidemia, hypertension, obesity, sedentary lifestyle, post-menopausal and tobacco exposure. Current diabetic treatment includes oral agent (monotherapy). She is following a generally unhealthy diet. When asked about meal planning, she reported none. She has not had a previous visit with a dietitian. She rarely participates in exercise. Her home blood glucose trend is decreasing steadily. An ACE inhibitor/angiotensin II receptor blocker is being taken. She does not see a podiatrist.Eye exam is not current.   Review of Systems    Review of systems  Constitutional: + Minimally fluctuating body weight,  current  Body mass index is 36.93 kg/m. , no fatigue, no subjective hyperthermia, no subjective hypothermia Eyes: no blurry vision, no xerophthalmia ENT: no sore throat, no nodules palpated in throat, no dysphagia/odynophagia, no hoarseness Cardiovascular: no Chest Pain, no Shortness of Breath, no palpitations, no leg swelling Respiratory: no cough, no shortness of breath Gastrointestinal: no Nausea/Vomiting/Diarhhea Musculoskeletal: no muscle/joint aches Skin: no rashes, no hyperemia Neurological: no tremors, no numbness, no tingling, no dizziness Psychiatric: no depression, no anxiety   Objective:    BP 139/77   Pulse 84   Ht 5\' 7"  (1.702 m)   Wt 235 lb 12.8 oz (107 kg)   BMI 36.93 kg/m   Wt Readings from Last 3 Encounters:  03/19/19 235 lb 12.8 oz (107 kg)  01/14/18 239 lb (108.4 kg)  09/16/17 247 lb (112 kg)    Physical Exam  Physical Exam- Limited  Constitutional:  Body mass index is 36.93 kg/m. , not in acute distress, normal state of mind Eyes:  EOMI, no exophthalmos Neck: Supple Thyroid: No gross goiter Respiratory: Adequate breathing efforts Musculoskeletal: no gross deformities, strength intact in all four extremities, no gross restriction of joint movements Skin:  no rashes, no hyperemia Neurological: no tremor with outstretched hands,    Recent Results (from  the past 2160 hour(s))  Lipid Panel     Status: Abnormal   Collection Time: 03/13/19  2:49 PM  Result Value Ref Range   Cholesterol 211 (H) <200 mg/dL   HDL 46 (L) > OR = 50 mg/dL   Triglycerides 412 (H) <150 mg/dL    Comment: . If a non-fasting specimen was collected, consider repeat triglyceride testing on a fasting specimen if clinically indicated.  Yates Decamp et al. J. of Clin. Lipidol. 6606;3:016-010. Marland Kitchen    LDL Cholesterol (Calc)  mg/dL (calc)    Comment: . LDL cholesterol not calculated. Triglyceride levels greater than 400 mg/dL invalidate calculated LDL results. . Reference range: <100 . Desirable range <100 mg/dL for primary prevention;   <70 mg/dL for patients with CHD or diabetic patients  with > or = 2 CHD risk factors. Marland Kitchen LDL-C is now calculated using the Martin-Hopkins  calculation, which is a validated novel method providing  better  accuracy than the Friedewald equation in the  estimation of LDL-C.  Horald Pollen et al. Lenox Ahr. 3662;947(65): 2061-2068  (http://education.[facility].com/faq/FAQ164)    Total CHOL/HDL Ratio 4.6 <5.0 (calc)   Non-HDL Cholesterol (Calc) 165 (H) <130 mg/dL (calc)    Comment: For patients with diabetes plus 1 major ASCVD risk  factor, treating to a non-HDL-C goal of <100 mg/dL  (LDL-C of <46 mg/dL) is considered a therapeutic  option.   TSH SOLSTAS     Status: None   Collection Time: 03/13/19  2:49 PM  Result Value Ref Range   TSH 3.81 0.40 - 4.50 mIU/L  T4, free SOLSTAS     Status: None   Collection Time: 03/13/19  2:49 PM  Result Value Ref Range   Free T4 1.5 0.8 - 1.8 ng/dL  HgB T0P     Status: Abnormal   Collection Time: 03/19/19  1:16 PM  Result Value Ref Range   Hemoglobin A1C 8.3 (A) 4.0 - 5.6 %   HbA1c POC (<> result, manual entry)     HbA1c, POC (prediabetic range)     HbA1c, POC (controlled diabetic range)       Assessment & Plan:   1. Hypothyroidism -Her thyroid function tests are consistent with appropriate  replacement.  She is advised to continue levothyroxine 150 mcg p.o. every morning.    - We discussed about the correct intake of her thyroid hormone, on empty stomach at fasting, with water, separated by at least 30 minutes from breakfast and other medications,  and separated by more than 4 hours from calcium, iron, multivitamins, acid reflux medications (PPIs). -Patient is made aware of the fact that thyroid hormone replacement is needed for life, dose to be adjusted by periodic monitoring of thyroid function tests.    2.  Type 2 diabetes-   -Her previsit labs show A1c 8.3% increasing from  7.8% . Her renal function is improving.  She is advised to continue  Metformin to 1000 mg daily after supper and 500 mg daily after breakfast.   I discussed and added Glipizide 5mg  XL at breakfast daily.  - she  admits there is a room for improvement in her diet and drink choices. -  Suggestion is made for her to avoid simple carbohydrates  from her diet including Cakes, Sweet Desserts / Pastries, Ice Cream, Soda (diet and regular), Sweet Tea, Candies, Chips, Cookies, Sweet Pastries,  Store Bought Juices, Alcohol in Excess of  1-2 drinks a day, Artificial Sweeteners, Coffee Creamer, and "Sugar-free" Products. This will help patient to have stable blood glucose profile and potentially avoid unintended weight gain.    3) hypertension:  she is advised to home monitor blood pressure and report if > 140/90 on 2 separate readings. -She is advised to be consistent in taking her blood pressure medications including benazepril 40 mg p.o. daily, metoprolol 50 mg p.o. twice daily and Lasix as needed.  4) hyperlipidemia: She does have uncontrolled triglyceridemia..  She is advised to continue Crestor 20 mg p.o. nightly, added fenofibrate  48 mg po qhs.  Side effects and precautions discussed with her.  5) Multinodular goiter : -Her surveillance thyroid ultrasound is unremarkable with stable thyroid lobe size and  unchanged  nodules from last exam.  She will not need any intervention at this time.  She will be considered for surveillance repeat thyroid ultrasound after her next visit.  6) vitamin D deficiency  -she never picked up her previous rx for vit d. Advised to start  vitamin D3 5000 units daily x90 days.  - I advised patient to maintain close follow up with Kirstie Peri, MD for primary care needs.  - Time spent on this patient care encounter:  35 min, of which >50% was spent in  counseling and the rest reviewing her  current and  previous labs/studies ( including abstraction from other facilities),  previous treatments, her blood glucose readings, and medications' doses and developing a plan for long-term care based on the latest recommendations for standards of care; and documenting her care.  Dana Lawson participated in the discussions, expressed understanding, and voiced agreement with the above plans.  All questions were answered to her satisfaction. she is encouraged to contact clinic should she have any questions or concerns prior to her return visit.  Follow up plan: Return in about 4 months (around 07/19/2019) for Next Visit A1c in Office.  Marquis Lunch, MD Phone: [phone]  Fax: [phone]  This note was partially dictated with voice recognition software. Similar sounding words can be transcribed inadequately or may not  be corrected upon review.  03/19/2019, 6:35 PM

## 2019-04-01 ENCOUNTER — Telehealth: Payer: Self-pay | Admitting: "Endocrinology

## 2019-04-01 NOTE — Telephone Encounter (Signed)
She needs both.

## 2019-04-01 NOTE — Telephone Encounter (Signed)
Tried to reach pt

## 2019-04-01 NOTE — Telephone Encounter (Signed)
Pt left a VM that she received fenofibrate (TRICOR) 48 MG tablet in the mail and she researched what that was for and it says cholesterol. Patient said her PMD has her on crestor. Please advise.

## 2019-04-02 ENCOUNTER — Other Ambulatory Visit: Payer: Self-pay | Admitting: "Endocrinology

## 2019-04-10 DIAGNOSIS — E119 Type 2 diabetes mellitus without complications: Secondary | ICD-10-CM | POA: Diagnosis not present

## 2019-04-10 DIAGNOSIS — E7800 Pure hypercholesterolemia, unspecified: Secondary | ICD-10-CM | POA: Diagnosis not present

## 2019-04-10 DIAGNOSIS — M069 Rheumatoid arthritis, unspecified: Secondary | ICD-10-CM | POA: Diagnosis not present

## 2019-04-10 DIAGNOSIS — J449 Chronic obstructive pulmonary disease, unspecified: Secondary | ICD-10-CM | POA: Diagnosis not present

## 2019-04-14 NOTE — Telephone Encounter (Signed)
Discussed with pt her medication, understanding voiced.

## 2019-04-14 NOTE — Telephone Encounter (Signed)
Patient would like you to call her. Advised of below msg. [phone]

## 2019-04-15 ENCOUNTER — Ambulatory Visit: Payer: Medicare Other | Attending: Internal Medicine

## 2019-04-15 DIAGNOSIS — Z23 Encounter for immunization: Secondary | ICD-10-CM

## 2019-04-15 NOTE — Progress Notes (Signed)
   Covid-19 Vaccination Clinic  Name:  Dana Lawson    MRN: [id number] DOB: Jul 05, 1945  04/15/2019  [name] was observed post Covid-19 immunization for 15 minutes without incident. She was provided with Vaccine Information Sheet and instruction to access the V-Safe system.   [name] was instructed to call 911 with any severe reactions post vaccine: Marland Kitchen Difficulty breathing  . Swelling of face and throat  . A fast heartbeat  . A bad rash all over body  . Dizziness and weakness   Immunizations Administered    Name Date Dose VIS Date Route   Moderna COVID-19 Vaccine 04/15/2019  3:18 PM 0.5 mL 12/09/2018 Intramuscular   Manufacturer: Gala Murdoch   Lot: [id number]   NDC: 76226-333-54      Covid-19 Vaccination Clinic  Name:  Dana Lawson    MRN: [id number] DOB: [birth year]  04/15/2019  Ms. Palm was observed post Covid-19 immunization for 15 minutes without incident. She was provided with Vaccine Information Sheet and instruction to access the V-Safe system.   [name] was instructed to call 911 with any severe reactions post vaccine: Marland Kitchen Difficulty breathing  . Swelling of face and throat  . A fast heartbeat  . A bad rash all over body  . Dizziness and weakness   Immunizations Administered    Name Date Dose VIS Date Route   Moderna COVID-19 Vaccine 04/15/2019  3:18 PM 0.5 mL 12/09/2018 Intramuscular   Manufacturer: Gala Murdoch   Lot: [id number]   NDC: 11572-620-35

## 2019-04-23 ENCOUNTER — Other Ambulatory Visit: Payer: Self-pay | Admitting: "Endocrinology

## 2019-04-23 ENCOUNTER — Other Ambulatory Visit: Payer: Self-pay

## 2019-04-23 DIAGNOSIS — I10 Essential (primary) hypertension: Secondary | ICD-10-CM | POA: Diagnosis not present

## 2019-04-23 DIAGNOSIS — E1165 Type 2 diabetes mellitus with hyperglycemia: Secondary | ICD-10-CM | POA: Diagnosis not present

## 2019-04-23 DIAGNOSIS — Z299 Encounter for prophylactic measures, unspecified: Secondary | ICD-10-CM | POA: Diagnosis not present

## 2019-04-23 DIAGNOSIS — Z87891 Personal history of nicotine dependence: Secondary | ICD-10-CM | POA: Diagnosis not present

## 2019-04-23 DIAGNOSIS — J309 Allergic rhinitis, unspecified: Secondary | ICD-10-CM | POA: Diagnosis not present

## 2019-04-23 DIAGNOSIS — I739 Peripheral vascular disease, unspecified: Secondary | ICD-10-CM | POA: Diagnosis not present

## 2019-04-23 DIAGNOSIS — J449 Chronic obstructive pulmonary disease, unspecified: Secondary | ICD-10-CM | POA: Diagnosis not present

## 2019-04-23 MED ORDER — METFORMIN HCL 500 MG PO TABS: ORAL_TABLET | ORAL | 0 refills | Status: DC

## 2019-05-05 DIAGNOSIS — F419 Anxiety disorder, unspecified: Secondary | ICD-10-CM | POA: Diagnosis not present

## 2019-05-05 DIAGNOSIS — Z299 Encounter for prophylactic measures, unspecified: Secondary | ICD-10-CM | POA: Diagnosis not present

## 2019-05-05 DIAGNOSIS — E1165 Type 2 diabetes mellitus with hyperglycemia: Secondary | ICD-10-CM | POA: Diagnosis not present

## 2019-05-05 DIAGNOSIS — I10 Essential (primary) hypertension: Secondary | ICD-10-CM | POA: Diagnosis not present

## 2019-05-05 DIAGNOSIS — J449 Chronic obstructive pulmonary disease, unspecified: Secondary | ICD-10-CM | POA: Diagnosis not present

## 2019-05-20 DIAGNOSIS — N6321 Unspecified lump in the left breast, upper outer quadrant: Secondary | ICD-10-CM | POA: Diagnosis not present

## 2019-05-20 DIAGNOSIS — N6489 Other specified disorders of breast: Secondary | ICD-10-CM | POA: Diagnosis not present

## 2019-05-20 DIAGNOSIS — R922 Inconclusive mammogram: Secondary | ICD-10-CM | POA: Diagnosis not present

## 2019-05-27 DIAGNOSIS — N6321 Unspecified lump in the left breast, upper outer quadrant: Secondary | ICD-10-CM | POA: Diagnosis not present

## 2019-05-27 DIAGNOSIS — N62 Hypertrophy of breast: Secondary | ICD-10-CM | POA: Diagnosis not present

## 2019-05-27 DIAGNOSIS — N6012 Diffuse cystic mastopathy of left breast: Secondary | ICD-10-CM | POA: Diagnosis not present

## 2019-06-07 DIAGNOSIS — E119 Type 2 diabetes mellitus without complications: Secondary | ICD-10-CM | POA: Diagnosis not present

## 2019-06-07 DIAGNOSIS — M069 Rheumatoid arthritis, unspecified: Secondary | ICD-10-CM | POA: Diagnosis not present

## 2019-06-07 DIAGNOSIS — E7800 Pure hypercholesterolemia, unspecified: Secondary | ICD-10-CM | POA: Diagnosis not present

## 2019-06-07 DIAGNOSIS — J449 Chronic obstructive pulmonary disease, unspecified: Secondary | ICD-10-CM | POA: Diagnosis not present

## 2019-06-12 IMAGING — DX DG ANKLE COMPLETE 3+V*L*
3 series · 3 of 3 positions shown · non-contrast
Comparison: None.

CLINICAL DATA: Medial left ankle pain for 2 days.  No known injury.

EXAM:
LEFT ANKLE COMPLETE - 3+ VIEW

[ankle ap]
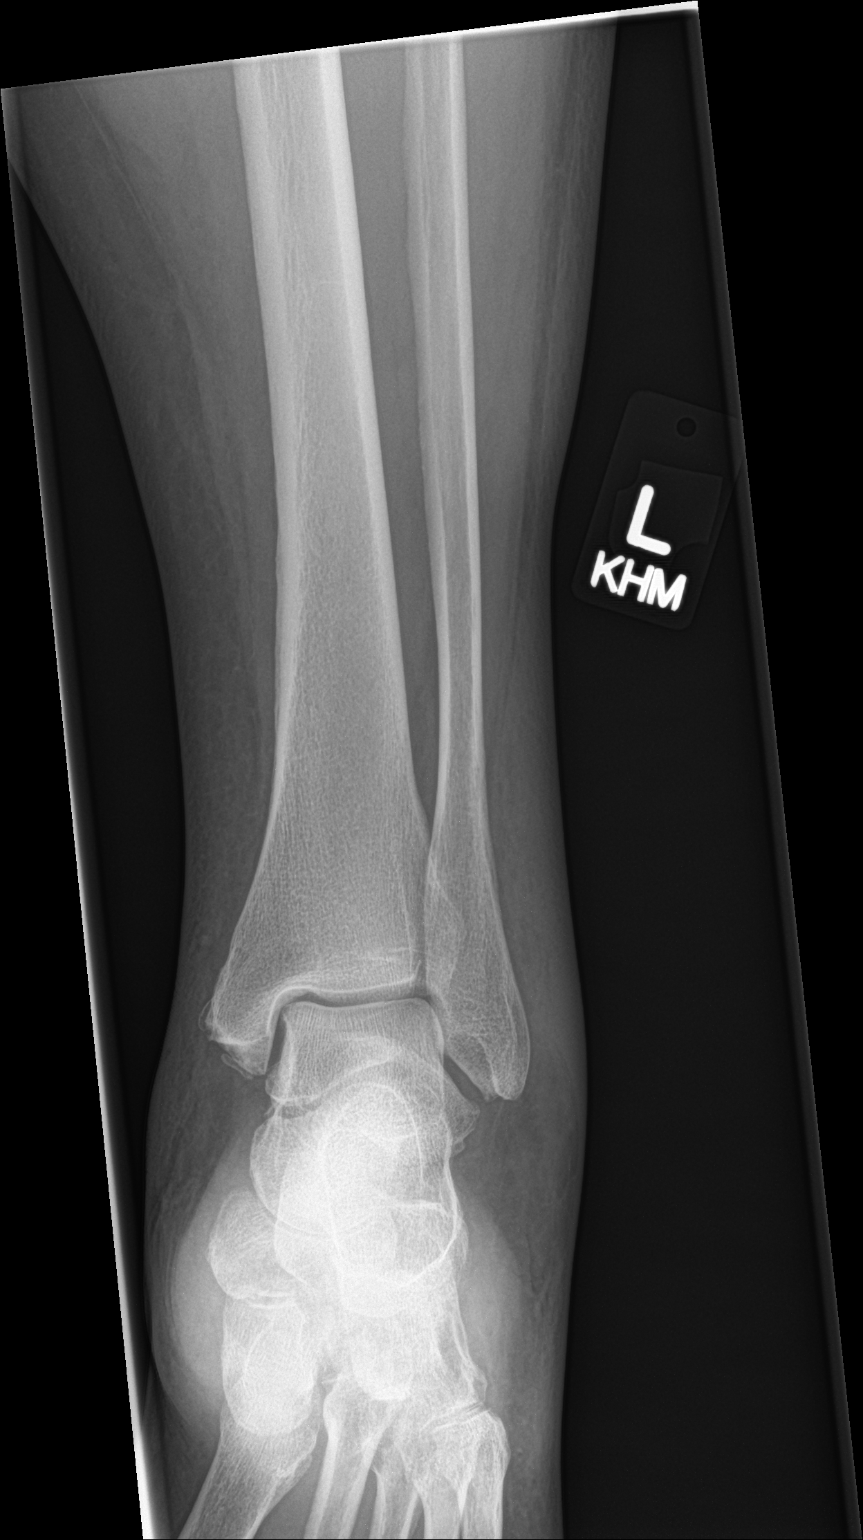

[ankle obl]
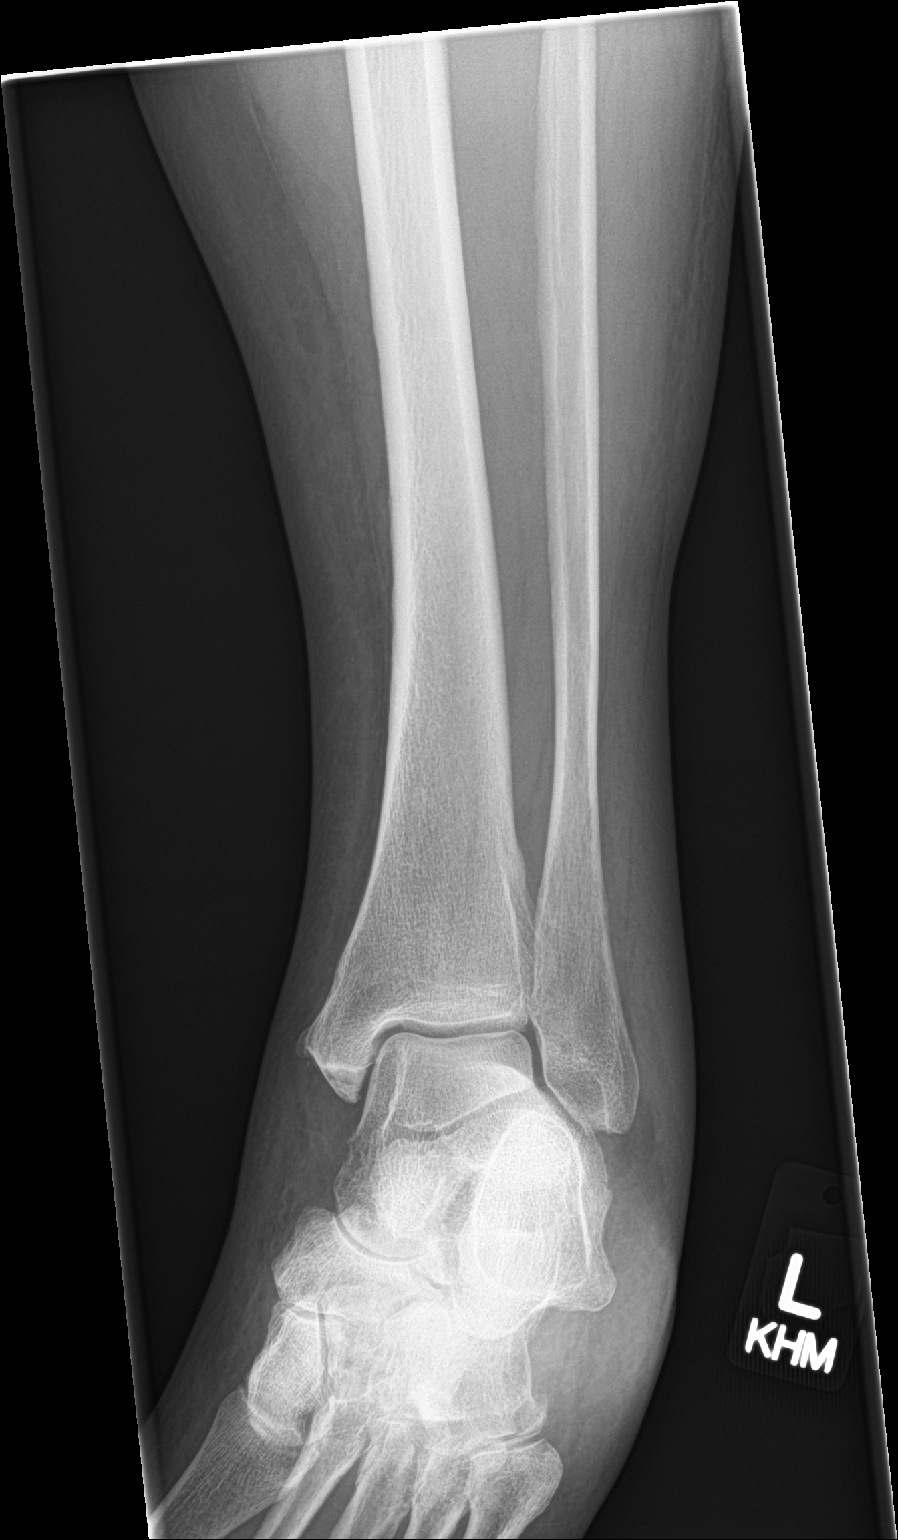

[ankle lat]
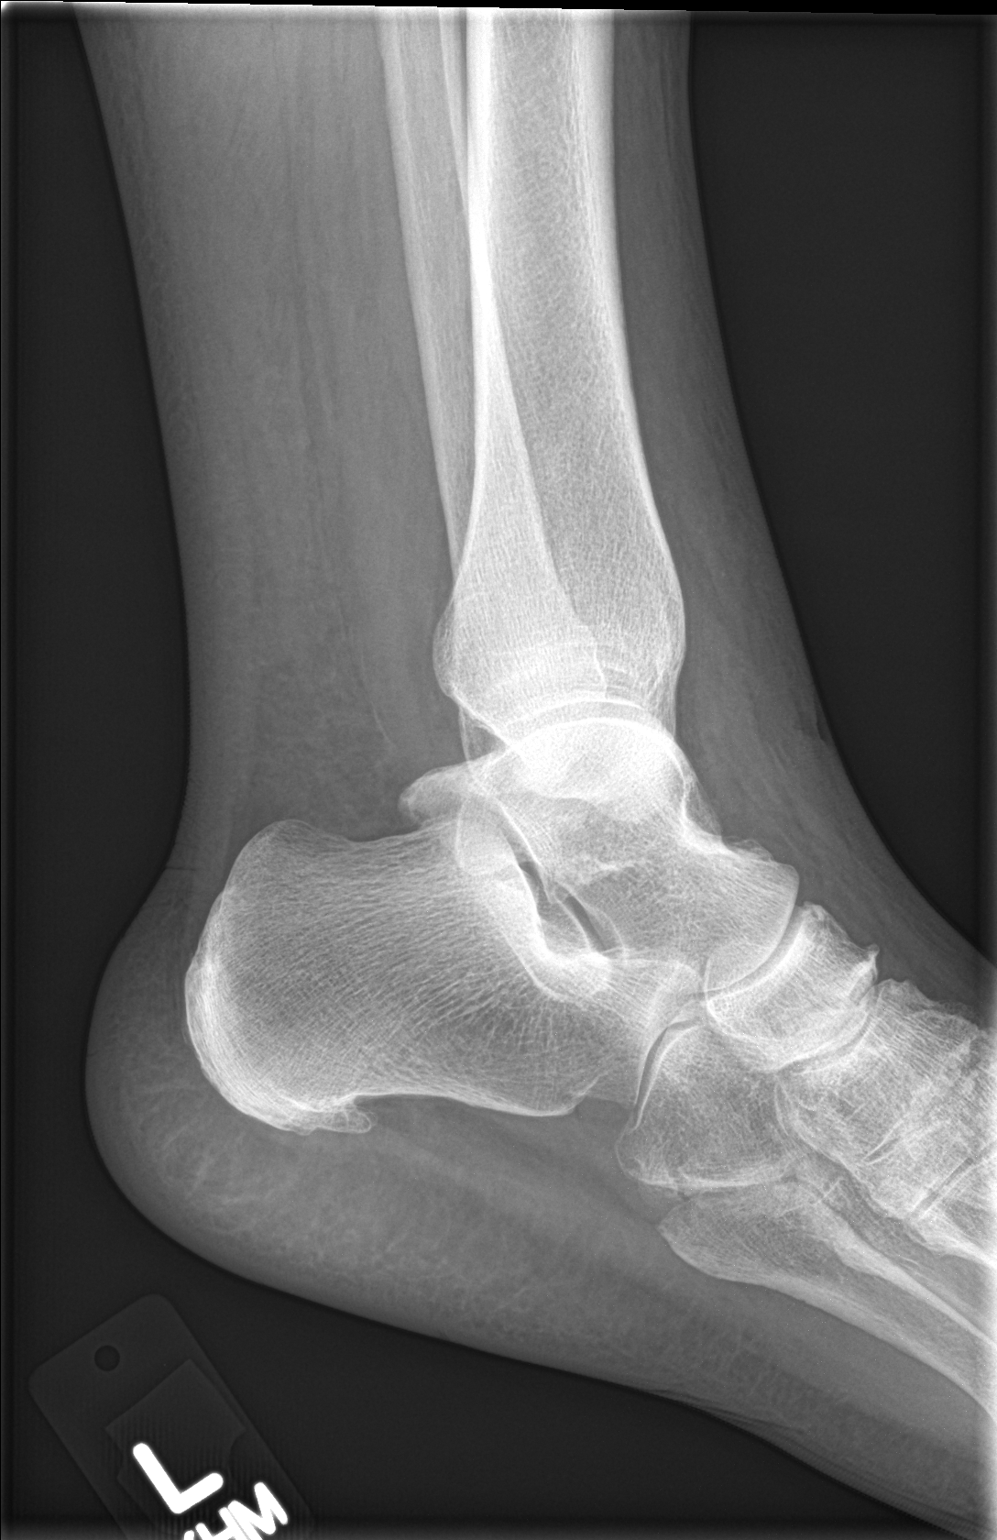

[3 of 3 positions shown; findings below may reference images not displayed]

FINDINGS: No acute bony or joint abnormality is seen. Midfoot osteoarthritis
is seen on the lateral view. The patient has a small plantar
calcaneal spur. Soft tissues are unremarkable.
IMPRESSION: No acute abnormality.

Midfoot osteoarthritis.

Calcaneal spur.

## 2019-06-25 ENCOUNTER — Encounter: Payer: Self-pay | Admitting: "Endocrinology

## 2019-06-25 DIAGNOSIS — H524 Presbyopia: Secondary | ICD-10-CM | POA: Diagnosis not present

## 2019-06-25 DIAGNOSIS — Z7984 Long term (current) use of oral hypoglycemic drugs: Secondary | ICD-10-CM | POA: Diagnosis not present

## 2019-06-25 DIAGNOSIS — H2513 Age-related nuclear cataract, bilateral: Secondary | ICD-10-CM | POA: Diagnosis not present

## 2019-06-25 DIAGNOSIS — H52223 Regular astigmatism, bilateral: Secondary | ICD-10-CM | POA: Diagnosis not present

## 2019-06-25 DIAGNOSIS — E119 Type 2 diabetes mellitus without complications: Secondary | ICD-10-CM | POA: Diagnosis not present

## 2019-06-25 DIAGNOSIS — H5203 Hypermetropia, bilateral: Secondary | ICD-10-CM | POA: Diagnosis not present

## 2019-06-25 LAB — HM DIABETES EYE EXAM

## 2019-07-08 DIAGNOSIS — E119 Type 2 diabetes mellitus without complications: Secondary | ICD-10-CM | POA: Diagnosis not present

## 2019-07-08 DIAGNOSIS — M069 Rheumatoid arthritis, unspecified: Secondary | ICD-10-CM | POA: Diagnosis not present

## 2019-07-08 DIAGNOSIS — J449 Chronic obstructive pulmonary disease, unspecified: Secondary | ICD-10-CM | POA: Diagnosis not present

## 2019-07-08 DIAGNOSIS — E7800 Pure hypercholesterolemia, unspecified: Secondary | ICD-10-CM | POA: Diagnosis not present

## 2019-07-21 ENCOUNTER — Ambulatory Visit: Payer: Medicare Other | Admitting: "Endocrinology

## 2019-07-22 ENCOUNTER — Other Ambulatory Visit: Payer: Self-pay | Admitting: "Endocrinology

## 2019-07-23 DIAGNOSIS — Z299 Encounter for prophylactic measures, unspecified: Secondary | ICD-10-CM | POA: Diagnosis not present

## 2019-07-23 DIAGNOSIS — R609 Edema, unspecified: Secondary | ICD-10-CM | POA: Diagnosis not present

## 2019-07-23 DIAGNOSIS — R0600 Dyspnea, unspecified: Secondary | ICD-10-CM | POA: Diagnosis not present

## 2019-07-23 DIAGNOSIS — F419 Anxiety disorder, unspecified: Secondary | ICD-10-CM | POA: Diagnosis not present

## 2019-07-23 DIAGNOSIS — I10 Essential (primary) hypertension: Secondary | ICD-10-CM | POA: Diagnosis not present

## 2019-07-23 DIAGNOSIS — E1165 Type 2 diabetes mellitus with hyperglycemia: Secondary | ICD-10-CM | POA: Diagnosis not present

## 2019-07-30 ENCOUNTER — Telehealth: Payer: Self-pay | Admitting: "Endocrinology

## 2019-07-30 MED ORDER — GLUCOSE BLOOD VI STRP: 1.0000 | ORAL_STRIP | Freq: Two times a day (BID) | 2 refills | Status: AC

## 2019-07-30 MED ORDER — RELION PRIME MONITOR DEVI: 1.0000 | Freq: Two times a day (BID) | 0 refills | Status: DC

## 2019-07-30 NOTE — Telephone Encounter (Signed)
Patient is requesting a RX for a Relion Prime Meter with supplies. [facility]

## 2019-07-30 NOTE — Telephone Encounter (Signed)
Rx sent 

## 2019-08-03 ENCOUNTER — Ambulatory Visit (INDEPENDENT_AMBULATORY_CARE_PROVIDER_SITE_OTHER): Payer: Medicare Other | Admitting: "Endocrinology

## 2019-08-03 ENCOUNTER — Encounter: Payer: Self-pay | Admitting: "Endocrinology

## 2019-08-03 ENCOUNTER — Other Ambulatory Visit: Payer: Self-pay

## 2019-08-03 VITALS — BP 132/80 | HR 84 | Ht 67.0 in | Wt 234.8 lb

## 2019-08-03 DIAGNOSIS — E782 Mixed hyperlipidemia: Secondary | ICD-10-CM | POA: Diagnosis not present

## 2019-08-03 DIAGNOSIS — I10 Essential (primary) hypertension: Secondary | ICD-10-CM

## 2019-08-03 DIAGNOSIS — E118 Type 2 diabetes mellitus with unspecified complications: Secondary | ICD-10-CM

## 2019-08-03 DIAGNOSIS — E038 Other specified hypothyroidism: Secondary | ICD-10-CM | POA: Diagnosis not present

## 2019-08-03 LAB — POCT GLYCOSYLATED HEMOGLOBIN (HGB A1C): Hemoglobin A1C: 6.9 % — AB (ref 4.0–5.6)

## 2019-08-03 MED ORDER — ACCU-CHEK GUIDE VI STRP: ORAL_STRIP | 0 refills | Status: DC

## 2019-08-03 MED ORDER — LANCETS MISC: 1.0000 | 0 refills | Status: DC

## 2019-08-03 MED ORDER — ACCU-CHEK GUIDE ME W/DEVICE KIT: 1.0000 | PACK | 0 refills | Status: DC

## 2019-08-03 MED ORDER — ACCU-CHEK GUIDE VI STRP: ORAL_STRIP | 1 refills | Status: AC

## 2019-08-03 NOTE — Patient Instructions (Signed)

## 2019-08-03 NOTE — Progress Notes (Signed)
08/03/2019   Endocrinology follow-up note   Subjective:    Patient ID: Dana Lawson, female    DOB: September 02, 1945, PCP Monico Blitz, MD   Past Medical History:  Diagnosis Date  . Anxiety   . COPD (chronic obstructive pulmonary disease) ([place])   . Depression   . Essential hypertension   . GERD (gastroesophageal reflux disease)   . Hepatic steatosis   . Hyperlipidemia   . Hypothyroidism   . Type 2 diabetes mellitus ([place])    Past Surgical History:  Procedure Laterality Date  . LEFT HEART CATH AND CORONARY ANGIOGRAPHY N/A 11/08/2016   Procedure: LEFT HEART CATH AND CORONARY ANGIOGRAPHY;  Surgeon: Leonie Man, MD;  Location: [facility];  Service: Cardiovascular;  Laterality: N/A;  . TUBAL LIGATION     Social History   Socioeconomic History  . Marital status: Married    Spouse name: Not on file  . Number of children: Not on file  . Years of education: Not on file  . Highest education level: Not on file  Occupational History  . Not on file  Tobacco Use  . Smoking status: Former Smoker    Types: Cigarettes    Quit date: 11/06/2015    Years since quitting: 3.7  . Smokeless tobacco: Never Used  Vaping Use  . Vaping Use: Never used  Substance and Sexual Activity  . Alcohol use: Yes    Alcohol/week: 0.0 standard drinks    Comment: Occasional  . Drug use: No  . Sexual activity: Not on file  Other Topics Concern  . Not on file  Social History Narrative  . Not on file   Social Determinants of Health   Financial Resource Strain:   . Difficulty of Paying Living Expenses:   Food Insecurity:   . Worried About Charity fundraiser in the Last Year:   . Arboriculturist in the Last Year:   Transportation Needs:   . Film/video editor (Medical):   Marland Kitchen Lack of Transportation (Non-Medical):   Physical Activity:   . Days of Exercise per Week:   . Minutes of Exercise per Session:   Stress:   . Feeling of Stress :   Social Connections:   . Frequency of  Communication with Friends and Family:   . Frequency of Social Gatherings with Friends and Family:   . Attends Religious Services:   . Active Member of Clubs or Organizations:   . Attends Archivist Meetings:   Marland Kitchen Marital Status:    Outpatient Encounter Medications as of 08/03/2019  Medication Sig  . metFORMIN (GLUCOPHAGE) 1000 MG tablet Take 1,000 mg by mouth 2 (two) times daily with a meal.  . aspirin 81 MG tablet Take 81 mg by mouth daily. Pt takes three times a week  . benazepril (LOTENSIN) 40 MG tablet Take 40 mg by mouth daily.  . Blood Glucose Monitoring Suppl (ACCU-CHEK GUIDE ME) w/Device KIT 1 Piece by Does not apply route as directed.  Marland Kitchen buPROPion (WELLBUTRIN SR) 150 MG 12 hr tablet Take 150 mg by mouth 2 (two) times daily.  . Cholecalciferol (VITAMIN D3) 125 MCG (5000 UT) CAPS Take 1 capsule (5,000 Units total) by mouth daily.  . fenofibrate (TRICOR) 48 MG tablet Take 1 tablet (48 mg total) by mouth daily.  . furosemide (LASIX) 40 MG tablet Take 40 mg by mouth daily.   Marland Kitchen glipiZIDE (GLUCOTROL XL) 5 MG 24 hr tablet Take 1 tablet (5 mg total) by  mouth daily with breakfast.  . glucose blood (ACCU-CHEK GUIDE) test strip Use as instructed  . glucose blood test strip 1 each by Other route 2 (two) times daily. Use as instructed bid. E11.65  . Lancets MISC 1 each by Does not apply route as directed.  Marland Kitchen omeprazole (PRILOSEC) 20 MG capsule Take 20 mg by mouth daily.  . potassium chloride (K-DUR) 10 MEQ tablet Take 10 mEq by mouth daily.  . rosuvastatin (CRESTOR) 20 MG tablet Take 20 mg by mouth daily.  Marland Kitchen SYNTHROID 150 MCG tablet TAKE 1 TABLET DAILY BEFORE BREAKFAST  . [DISCONTINUED] Blood Glucose Monitoring Suppl (RELION PRIME MONITOR) DEVI 1 each by Does not apply route 2 (two) times daily.  . [DISCONTINUED] glucose blood (ACCU-CHEK GUIDE) test strip Use as instructed  . [DISCONTINUED] metFORMIN (GLUCOPHAGE) 500 MG tablet Take 2 tablets after supper and 1 tablet after breakfast    No facility-administered encounter medications on file as of 08/03/2019.   ALLERGIES: Allergies  Allergen Reactions  . Flagyl [Metronidazole]    VACCINATION STATUS: Immunization History  Administered Date(s) Administered  . Moderna SARS-COVID-2 Vaccination 03/14/2019, 04/15/2019    Diabetes She presents for her follow-up diabetic visit. She has type 2 diabetes mellitus. Onset time: She was diagnosed at approximate age of 5 years. Her disease course has been improving. There are no hypoglycemic associated symptoms. There are no diabetic associated symptoms. There are no hypoglycemic complications. Symptoms are improving. There are no diabetic complications. Risk factors for coronary artery disease include diabetes mellitus, dyslipidemia, hypertension, obesity, sedentary lifestyle, post-menopausal and tobacco exposure. Current diabetic treatment includes oral agent (monotherapy). She is following a generally unhealthy diet. When asked about meal planning, she reported none. She has not had a previous visit with a dietitian. She rarely participates in exercise. Her home blood glucose trend is decreasing steadily. An ACE inhibitor/angiotensin II receptor blocker is being taken. She does not see a podiatrist.Eye exam is not current.   Review of Systems    Review of systems  Constitutional: + Minimally fluctuating body weight,  current  Body mass index is 36.77 kg/m. , no fatigue, no subjective hyperthermia, no subjective hypothermia Eyes: no blurry vision, no xerophthalmia ENT: no sore throat, no nodules palpated in throat, no dysphagia/odynophagia, no hoarseness Cardiovascular: no Chest Pain, no Shortness of Breath, no palpitations, no leg swelling Respiratory: no cough, no shortness of breath Gastrointestinal: no Nausea/Vomiting/Diarhhea Musculoskeletal: no muscle/joint aches Skin: no rashes, no hyperemia Neurological: no tremors, no numbness, no tingling, no dizziness Psychiatric: no  depression, no anxiety   Objective:    BP (!) 132/80   Pulse 84   Ht _0  (1.702 m)   Wt (!) 234 lb 12.8 oz (106.5 kg)   BMI 36.77 kg/m   Wt Readings from Last 3 Encounters:  08/03/19 (!) 234 lb 12.8 oz (106.5 kg)  03/19/19 235 lb 12.8 oz (107 kg)  01/14/18 239 lb (108.4 kg)    Physical Exam  Physical Exam- Limited  Constitutional:  Body mass index is 36.77 kg/m. , not in acute distress, normal state of mind Eyes:  EOMI, no exophthalmos Neck: Supple Thyroid: No gross goiter Respiratory: Adequate breathing efforts Musculoskeletal: no gross deformities, strength intact in all four extremities, no gross restriction of joint movements Skin:  no rashes, no hyperemia Neurological: no tremor with outstretched hands,    Recent Results (from the past 2160 hour(s))  HgB A1c     Status: Abnormal   Collection Time: 08/03/19  4:02 PM  Result Value Ref Range   Hemoglobin A1C 6.9 (A) 4.0 - 5.6 %   HbA1c POC (<> result, manual entry)     HbA1c, POC (prediabetic range)     HbA1c, POC (controlled diabetic range)       Assessment & Plan:   1. Hypothyroidism -Her thyroid function tests are consistent with appropriate replacement.  She is advised to continue levothyroxine 150 mcg p.o. daily before breakfast.    - We discussed about the correct intake of her thyroid hormone, on empty stomach at fasting, with water, separated by at least 30 minutes from breakfast and other medications,  and separated by more than 4 hours from calcium, iron, multivitamins, acid reflux medications (PPIs). -Patient is made aware of the fact that thyroid hormone replacement is needed for life, dose to be adjusted by periodic monitoring of thyroid function tests.   2.  Type 2 diabetes-   -Her point-of-care A1c is 6.9% improving from 8.3%.   She is advised to continue  Metformin to 1000 mg daily after supper and 1000 mg daily after breakfast.   She will need close follow-up of her renal function. -She has  benefited and responded to low-dose glipizide.  She is advised to continue Glipizide '5mg'$  XL at breakfast daily.  - she  admits there is a room for improvement in her diet and drink choices. -  Suggestion is made for her to avoid simple carbohydrates  from her diet including Cakes, Sweet Desserts / Pastries, Ice Cream, Soda (diet and regular), Sweet Tea, Candies, Chips, Cookies, Sweet Pastries,  Store Bought Juices, Alcohol in Excess of  1-2 drinks a day, Artificial Sweeteners, Coffee Creamer, and "Sugar-free" Products. This will help patient to have stable blood glucose profile and potentially avoid unintended weight gain.  3) hypertension:   -She is advised to be consistent in taking her blood pressure medications including benazepril 40 mg p.o. daily, metoprolol 50 mg p.o. twice daily and Lasix as needed.  4) hyperlipidemia: She does have uncontrolled triglyceridemia.  She is advised to continue Crestor 20 mg p.o. nightly, added fenofibrate  48 mg po qhs.  Side effects and precautions discussed with her.  5) Multinodular goiter : -Her surveillance thyroid ultrasound is unremarkable with stable thyroid lobe size and unchanged  nodules from last exam and stable ultrasound findings in 2012 2019.   She will not need any intervention at this time.  She will be considered for surveillance repeat thyroid ultrasound after her next visit.  6) vitamin D deficiency  -She is currently on vitamin D supplement, D3 5000 units daily .  - I advised patient to maintain close follow up with Monico Blitz, MD for primary care needs.     - Time spent on this patient care encounter:  35 minutes of which 50% was spent in  counseling and the rest reviewing  her current and  previous labs / studies and medications  doses and developing a plan for long term care. Dana Lawson  participated in the discussions, expressed understanding, and voiced agreement with the above plans.  All questions were answered to her  satisfaction. she is encouraged to contact clinic should she have any questions or concerns prior to her return visit.   Follow up plan: Return in about 4 months (around 12/04/2019) for F/U with Pre-visit Labs, Meter, Logs, A1c here.Glade Lloyd, MD Phone: [phone]  Fax: [phone]  This note was partially dictated with voice recognition software. Similar sounding words can be  transcribed inadequately or may not  be corrected upon review.  08/03/2019, 7:32 PM

## 2019-08-06 ENCOUNTER — Other Ambulatory Visit: Payer: Self-pay

## 2019-08-06 DIAGNOSIS — E118 Type 2 diabetes mellitus with unspecified complications: Secondary | ICD-10-CM

## 2019-08-06 MED ORDER — FREESTYLE LANCETS MISC: 12 refills | Status: AC

## 2019-08-07 DIAGNOSIS — M069 Rheumatoid arthritis, unspecified: Secondary | ICD-10-CM | POA: Diagnosis not present

## 2019-08-07 DIAGNOSIS — J449 Chronic obstructive pulmonary disease, unspecified: Secondary | ICD-10-CM | POA: Diagnosis not present

## 2019-08-07 DIAGNOSIS — E119 Type 2 diabetes mellitus without complications: Secondary | ICD-10-CM | POA: Diagnosis not present

## 2019-08-07 DIAGNOSIS — E7800 Pure hypercholesterolemia, unspecified: Secondary | ICD-10-CM | POA: Diagnosis not present

## 2019-08-10 ENCOUNTER — Telehealth: Payer: Self-pay | Admitting: "Endocrinology

## 2019-08-10 MED ORDER — FREESTYLE LITE DEVI: 1.0000 | Freq: Two times a day (BID) | 0 refills | Status: AC

## 2019-08-10 NOTE — Telephone Encounter (Signed)
Rx sent 

## 2019-08-10 NOTE — Telephone Encounter (Signed)
Patient is calling and states her insurance does not pay for a meter so she is needing it sent to pharmacy below.  [facility] [address] [place] - [address] Phone:  [phone]  Fax:  [phone]

## 2019-09-04 ENCOUNTER — Other Ambulatory Visit: Payer: Self-pay | Admitting: "Endocrinology

## 2019-09-08 DIAGNOSIS — H25813 Combined forms of age-related cataract, bilateral: Secondary | ICD-10-CM | POA: Diagnosis not present

## 2019-09-08 DIAGNOSIS — H01001 Unspecified blepharitis right upper eyelid: Secondary | ICD-10-CM | POA: Diagnosis not present

## 2019-09-08 DIAGNOSIS — H01002 Unspecified blepharitis right lower eyelid: Secondary | ICD-10-CM | POA: Diagnosis not present

## 2019-09-08 DIAGNOSIS — H01004 Unspecified blepharitis left upper eyelid: Secondary | ICD-10-CM | POA: Diagnosis not present

## 2019-09-15 NOTE — H&P (Signed)
Surgical History & Physical  Patient Name: Dana Lawson DOB: 06-26-1945  Surgery: Cataract extraction with intraocular lens implant phacoemulsification; Left Eye  Surgeon: Fabio Pierce MD Surgery Date:  09/25/2019 Pre-Op Date:  09/08/2019  HPI: A 86 Yr. old female patient Pt presents for Cataract evaluation. Pt c/o a decrease in vision, especially at night time when driving. Pt c/o headlights being bothersome, and glare makes it difficult to see. This is negatively affecting the patient's quality of life. Pt reports some tearing OU, and some pain/ pressure behind OD at times. Pt also c/o an increase in floaters OU, no flashes of light. 1. 1. The patient complains of nighttime light - car headlights, street lamps etc. glare causing poor vision, which began 2 years ago. Both eyes are affected. The condition's severity decreased since last visit. Symptoms occur when the patient is driving. The condition is worse night. The complaint is associated with glare. This is negatively affecting the patient's quality of life. HPI Completed by Dr. Fabio Pierce  Medical History: Cataracts Diabetes High Blood Pressure Hypercholesterolemia Thyroid Problems  Review of Systems Negative Allergic/Immunologic Negative Cardiovascular Negative Constitutional Negative Ear, Nose, Mouth & Throat Negative Endocrine Negative Eyes Negative Gastrointestinal Negative Genitourinary Negative Hemotologic/Lymphatic Negative Integumentary Negative Musculoskeletal Negative Neurological Negative Psychiatry Negative Respiratory  Social   Former smoker  Medication Benazepril, Bupropion HCL, Clonazepam, Lurosemide, Glipizide, Meloxicam, Metformin, Omeprazole, Potassium chloride, Rosuvastatin, Synthroid, Tricor,   Sx/Procedures C Section,   Drug Allergies  Flagyl,   History & Physical: Heent:  Cataract, Left eye NECK: supple without bruits LUNGS: lungs clear to auscultation CV: regular rate and  rhythm Abdomen: soft and non-tender  Impression & Plan: Assessment: 1.  COMBINED FORMS AGE RELATED CATARACT; Both Eyes (H25.813) 2.  ASTIGMATISM, REGULAR; Both Eyes (H52.223) 3.  BLEPHARITIS; Right Upper Lid, Right Lower Lid, Left Upper Lid, Left Lower Lid (H01.001, H01.002,H01.004,H01.005) 4.  DERMATOCHALASIS, no surgery; Right Upper Lid, Left Upper Lid (H02.831, H02.834) 5.  CONJUNCTIVOCHALASIS; Both Eyes (W43.154)  Plan: 1.  Cataract accounts for the patient's decreased vision. This visual impairment is not correctable with a tolerable change in glasses or contact lenses. Cataract surgery with an implantation of a new lens should significantly improve the visual and functional status of the patient. Discussed all risks, benefits, alternatives, and potential complications. Discussed the procedures and recovery. Patient desires to have surgery. A-scan ordered and performed today for intra-ocular lens calculations. The surgery will be performed in order to improve vision for driving, reading, and for eye examinations. Recommend phacoemulsification with intra-ocular lens. Recommend Dextenza for post-operative pain and inflammation. Left Eye. non dominant. First. Dilates well - shugarcaine by protocol. Consider Toric Lens. 2.  Recommend Toric Lens as above. 3.  Begin/continue lid scrubs. 4.  Asymptomatic, recommend observation for now. Findings, prognosis and treatment options reviewed. 5.  Asymptomatic.

## 2019-09-17 NOTE — Patient Instructions (Signed)
Dana Lawson  09/17/2019     @PREFPERIOPPHARMACY @   Your procedure is scheduled on  09/25/2019  Report to [facility] at  0900  A.M.  Call this number if you have problems the morning of surgery:  [phone]   Remember:  Do not eat or drink after midnight.                        Take these medicines the morning of surgery with A SIP OF WATER  Wellbutrin, clonazepam, mobic(if neded), prilosec, synthroid.    Do not wear jewelry, make-up or nail polish.  Do not wear lotions, powders, or perfume. Please wear deodorant and brush your teeth.  Do not shave 48 hours prior to surgery.  Men may shave face and neck.  Do not bring valuables to the hospital.  [facility] is not responsible for any belongings or valuables.  Contacts, dentures or bridgework may not be worn into surgery.  Leave your suitcase in the car.  After surgery it may be brought to your room.  For patients admitted to the hospital, discharge time will be determined by your treatment team.  Patients discharged the day of surgery will not be allowed to drive home.   Name and phone number of your driver:   family Special instructions:   DO NOT smoke the morning of your procedure.  Please read over the following fact sheets that you were given. Anesthesia Post-op Instructions and Care and Recovery After Surgery      Cataract Surgery, Care After This sheet gives you information about how to care for yourself after your procedure. Your health care provider may also give you more specific instructions. If you have problems or questions, contact your health care provider. What can I expect after the procedure? After the procedure, it is common to have:  Itching.  Discomfort.  Fluid discharge.  Sensitivity to light and to touch.  Bruising in or around the eye.  Mild blurred vision. Follow these instructions at home: Eye care   Do not touch or rub your eyes.  Protect your eyes as told by your health  care provider. You may be told to wear a protective eye shield or sunglasses.  Do not put a contact lens into the affected eye or eyes until your health care provider approves.  Keep the area around your eye clean and dry: ? Avoid swimming. ? Do not allow water to hit you directly in the face while showering. ? Keep soap and shampoo out of your eyes.  Check your eye every day for signs of infection. Watch for: ? Redness, swelling, or pain. ? Fluid, blood, or pus. ? Warmth. ? A bad smell. ? Vision that is getting worse. ? Sensitivity that is getting worse. Activity  Do not drive for 24 hours if you were given a sedative during your procedure.  Avoid strenuous activities, such as playing contact sports, for as long as told by your health care provider.  Do not drive or use heavy machinery until your health care provider approves.  Do not bend or lift heavy objects. Bending increases pressure in the eye. You can walk, climb stairs, and do light household chores.  Ask your health care provider when you can return to work. If you work in a dusty environment, you may be advised to wear protective eyewear for a period of time. General instructions  Take or apply over-the-counter and prescription medicines  only as told by your health care provider. This includes eye drops.  Keep all follow-up visits as told by your health care provider. This is important. Contact a health care provider if:  You have increased bruising around your eye.  You have pain that is not helped with medicine.  You have a fever.  You have redness, swelling, or pain in your eye.  You have fluid, blood, or pus coming from your incision.  Your vision gets worse.  Your sensitivity to light gets worse. Get help right away if:  You have sudden loss of vision.  You see flashes of light or spots (floaters).  You have severe eye pain.  You develop nausea or vomiting. Summary  After your procedure, it is  common to have itching, discomfort, bruising, fluid discharge, or sensitivity to light.  Follow instructions from your health care provider about caring for your eye after the procedure.  Do not rub your eye after the procedure. You may need to wear eye protection or sunglasses. Do not wear contact lenses. Keep the area around your eye clean and dry.  Avoid activities that require a lot of effort. These include playing sports and lifting heavy objects.  Contact a health care provider if you have increased bruising, pain that does not go away, or a fever. Get help right away if you suddenly lose your vision, see flashes of light or spots, or have severe pain in the eye. This information is not intended to replace advice given to you by your health care provider. Make sure you discuss any questions you have with your health care provider. Document Revised: 10/21/2018 Document Reviewed: 06/24/2017 Elsevier Patient Education  2020 [employer].

## 2019-09-21 DIAGNOSIS — H25812 Combined forms of age-related cataract, left eye: Secondary | ICD-10-CM | POA: Diagnosis not present

## 2019-09-23 ENCOUNTER — Other Ambulatory Visit (HOSPITAL_COMMUNITY)
Admission: RE | Admit: 2019-09-23 | Discharge: 2019-09-23 | Disposition: A | Discharge status: Home / Self Care | Payer: Medicare Other | Source: Ambulatory Visit | Attending: Ophthalmology | Admitting: Ophthalmology

## 2019-09-23 ENCOUNTER — Other Ambulatory Visit: Payer: Self-pay

## 2019-09-23 ENCOUNTER — Encounter (HOSPITAL_COMMUNITY)
Admission: RE | Admit: 2019-09-23 | Discharge: 2019-09-23 | Disposition: A | Discharge status: Home / Self Care | Payer: Medicare Other | Source: Ambulatory Visit | Attending: Ophthalmology | Admitting: Ophthalmology

## 2019-09-23 ENCOUNTER — Encounter (HOSPITAL_COMMUNITY): Payer: Self-pay

## 2019-09-23 DIAGNOSIS — Z20822 Contact with and (suspected) exposure to covid-19: Secondary | ICD-10-CM | POA: Insufficient documentation

## 2019-09-23 DIAGNOSIS — Z01818 Encounter for other preprocedural examination: Secondary | ICD-10-CM | POA: Insufficient documentation

## 2019-09-23 DIAGNOSIS — Z01812 Encounter for preprocedural laboratory examination: Secondary | ICD-10-CM | POA: Insufficient documentation

## 2019-09-23 LAB — BASIC METABOLIC PANEL
Anion gap: 8 (ref 5–15)
BUN: 17 mg/dL (ref 8–23)
CO2: 28 mmol/L (ref 22–32)
Calcium: 9.9 mg/dL (ref 8.9–10.3)
Chloride: 101 mmol/L (ref 98–111)
Creatinine, Ser: 1.28 mg/dL — ABNORMAL HIGH (ref 0.44–1.00)
GFR calc Af Amer: 49 mL/min — ABNORMAL LOW (ref >60)
GFR calc non Af Amer: 42 mL/min — ABNORMAL LOW (ref >60)
Glucose, Bld: 114 mg/dL — ABNORMAL HIGH (ref 70–99)
Potassium: 4.3 mmol/L (ref 3.5–5.1)
Sodium: 137 mmol/L (ref 135–145)

## 2019-09-23 LAB — HEMOGLOBIN A1C
Hgb A1c MFr Bld: 6.9 % — ABNORMAL HIGH (ref 4.8–5.6)
Mean Plasma Glucose: 151.33 mg/dL

## 2019-09-24 LAB — SARS CORONAVIRUS 2 (TAT 6-24 HRS): SARS Coronavirus 2: NEGATIVE

## 2019-09-25 ENCOUNTER — Encounter (HOSPITAL_COMMUNITY): Payer: Self-pay | Admitting: Ophthalmology

## 2019-09-25 ENCOUNTER — Ambulatory Visit (HOSPITAL_COMMUNITY): Payer: Medicare Other | Admitting: Anesthesiology

## 2019-09-25 ENCOUNTER — Other Ambulatory Visit: Payer: Self-pay

## 2019-09-25 ENCOUNTER — Encounter (HOSPITAL_COMMUNITY)
Admission: RE | Disposition: A | Discharge status: Home / Self Care | Payer: Self-pay | Source: Home / Self Care | Attending: Ophthalmology

## 2019-09-25 ENCOUNTER — Ambulatory Visit (HOSPITAL_COMMUNITY)
Admission: RE | Admit: 2019-09-25 | Discharge: 2019-09-25 | Disposition: A | Discharge status: Home / Self Care | Payer: Medicare Other | Attending: Ophthalmology | Admitting: Ophthalmology

## 2019-09-25 DIAGNOSIS — E119 Type 2 diabetes mellitus without complications: Secondary | ICD-10-CM | POA: Diagnosis not present

## 2019-09-25 DIAGNOSIS — E7800 Pure hypercholesterolemia, unspecified: Secondary | ICD-10-CM | POA: Insufficient documentation

## 2019-09-25 DIAGNOSIS — Z791 Long term (current) use of non-steroidal anti-inflammatories (NSAID): Secondary | ICD-10-CM | POA: Diagnosis not present

## 2019-09-25 DIAGNOSIS — J449 Chronic obstructive pulmonary disease, unspecified: Secondary | ICD-10-CM | POA: Insufficient documentation

## 2019-09-25 DIAGNOSIS — Z87891 Personal history of nicotine dependence: Secondary | ICD-10-CM | POA: Insufficient documentation

## 2019-09-25 DIAGNOSIS — Z7984 Long term (current) use of oral hypoglycemic drugs: Secondary | ICD-10-CM | POA: Diagnosis not present

## 2019-09-25 DIAGNOSIS — E039 Hypothyroidism, unspecified: Secondary | ICD-10-CM | POA: Diagnosis not present

## 2019-09-25 DIAGNOSIS — E1136 Type 2 diabetes mellitus with diabetic cataract: Secondary | ICD-10-CM | POA: Insufficient documentation

## 2019-09-25 DIAGNOSIS — F418 Other specified anxiety disorders: Secondary | ICD-10-CM | POA: Diagnosis not present

## 2019-09-25 DIAGNOSIS — Z79899 Other long term (current) drug therapy: Secondary | ICD-10-CM | POA: Insufficient documentation

## 2019-09-25 DIAGNOSIS — H25812 Combined forms of age-related cataract, left eye: Secondary | ICD-10-CM | POA: Insufficient documentation

## 2019-09-25 DIAGNOSIS — I10 Essential (primary) hypertension: Secondary | ICD-10-CM | POA: Diagnosis not present

## 2019-09-25 DIAGNOSIS — E785 Hyperlipidemia, unspecified: Secondary | ICD-10-CM | POA: Diagnosis not present

## 2019-09-25 DIAGNOSIS — H5712 Ocular pain, left eye: Secondary | ICD-10-CM | POA: Diagnosis not present

## 2019-09-25 LAB — GLUCOSE, CAPILLARY: Glucose-Capillary: 147 mg/dL — ABNORMAL HIGH (ref 70–99)

## 2019-09-25 SURGERY — CATARACT EXTRACTION PHACO AND INTRAOCULAR LENS PLACEMENT (IOC) with placement of Corticosteroid
Anesthesia: Monitor Anesthesia Care | Site: Eye | Laterality: Left

## 2019-09-25 MED ORDER — DEXAMETHASONE 0.4 MG OP INST
VAGINAL_INSERT | OPHTHALMIC | Status: DC | PRN
  Administered 2019-09-25: 0.4 mg via OPHTHALMIC

## 2019-09-25 MED ORDER — CYCLOPENTOLATE-PHENYLEPHRINE 0.2-1 % OP SOLN
1.0000 [drp] | OPHTHALMIC | Status: AC | PRN
  Administered 2019-09-25 (×3): 1 [drp] via OPHTHALMIC

## 2019-09-25 MED ORDER — MIDAZOLAM HCL 2 MG/2ML IJ SOLN
INTRAMUSCULAR | Status: AC
  Filled 2019-09-25: qty 2

## 2019-09-25 MED ORDER — DEXAMETHASONE 0.4 MG OP INST
VAGINAL_INSERT | OPHTHALMIC | Status: AC
  Filled 2019-09-25: qty 1

## 2019-09-25 MED ORDER — PROVISC 10 MG/ML IO SOLN
INTRAOCULAR | Status: DC | PRN
  Administered 2019-09-25: 0.85 mL via INTRAOCULAR

## 2019-09-25 MED ORDER — TETRACAINE HCL 0.5 % OP SOLN
1.0000 [drp] | OPHTHALMIC | Status: AC | PRN
  Administered 2019-09-25 (×3): 1 [drp] via OPHTHALMIC

## 2019-09-25 MED ORDER — MIDAZOLAM HCL 2 MG/2ML IJ SOLN
INTRAMUSCULAR | Status: DC | PRN
  Administered 2019-09-25: 1 mg via INTRAVENOUS

## 2019-09-25 MED ORDER — PHENYLEPHRINE HCL 2.5 % OP SOLN
1.0000 [drp] | OPHTHALMIC | Status: AC | PRN
  Administered 2019-09-25 (×3): 1 [drp] via OPHTHALMIC

## 2019-09-25 MED ORDER — SODIUM HYALURONATE 23 MG/ML IO SOLN
INTRAOCULAR | Status: DC | PRN
  Administered 2019-09-25: 0.6 mL via INTRAOCULAR

## 2019-09-25 MED ORDER — LIDOCAINE HCL (PF) 1 % IJ SOLN
INTRAOCULAR | Status: DC | PRN
  Administered 2019-09-25: 1 mL via OPHTHALMIC

## 2019-09-25 MED ORDER — LIDOCAINE HCL 3.5 % OP GEL
1.0000 "application " | Freq: Once | OPHTHALMIC | Status: AC
  Administered 2019-09-25: 1 via OPHTHALMIC

## 2019-09-25 MED ORDER — POVIDONE-IODINE 5 % OP SOLN
OPHTHALMIC | Status: DC | PRN
  Administered 2019-09-25: 1 via OPHTHALMIC

## 2019-09-25 MED ORDER — LACTATED RINGERS IV SOLN: INTRAVENOUS | Status: DC

## 2019-09-25 MED ORDER — BSS IO SOLN
INTRAOCULAR | Status: DC | PRN
  Administered 2019-09-25: 15 mL via INTRAOCULAR

## 2019-09-25 MED ORDER — EPINEPHRINE PF 1 MG/ML IJ SOLN
INTRAOCULAR | Status: DC | PRN
  Administered 2019-09-25: 500 mL

## 2019-09-25 SURGICAL SUPPLY — 13 items
CLOTH BEACON ORANGE TIMEOUT ST (SAFETY) ×3 IMPLANT
EYE SHIELD UNIVERSAL CLEAR (GAUZE/BANDAGES/DRESSINGS) ×3 IMPLANT
GLOVE BIOGEL PI IND STRL 7.0 (GLOVE) ×2 IMPLANT
GLOVE BIOGEL PI INDICATOR 7.0 (GLOVE) ×4
LENS ALC ACRYL/TECN (Ophthalmic Related) ×3 IMPLANT
NDL HYPO 18GX1.5 BLUNT FILL (NEEDLE) IMPLANT
NEEDLE HYPO 18GX1.5 BLUNT FILL (NEEDLE) ×4 IMPLANT
PAD ARMBOARD 7.5X6 YLW CONV (MISCELLANEOUS) ×3 IMPLANT
SYR TB 1ML LL NO SAFETY (SYRINGE) ×3 IMPLANT
TAPE SURG TRANSPORE 1 IN (GAUZE/BANDAGES/DRESSINGS) ×1 IMPLANT
TAPE SURGICAL TRANSPORE 1 IN (GAUZE/BANDAGES/DRESSINGS) ×4
VISCOELASTIC ADDITIONAL (OPHTHALMIC RELATED) ×3 IMPLANT
WATER STERILE IRR 250ML POUR (IV SOLUTION) ×3 IMPLANT

## 2019-09-25 NOTE — Anesthesia Preprocedure Evaluation (Addendum)
Anesthesia Evaluation  Patient identified by MRN, date of birth, ID band Patient awake    Reviewed: Allergy & Precautions, NPO status , Patient's Chart, lab work & pertinent test results  History of Anesthesia Complications Negative for: history of anesthetic complications  Airway Mallampati: III  TM Distance: >3 FB Neck ROM: Full    Dental  (+) Partial Lower   Pulmonary COPD, former smoker,    Pulmonary exam normal breath sounds clear to auscultation       Cardiovascular hypertension, Pt. on medications Normal cardiovascular exam Rhythm:Regular Rate:Normal     Neuro/Psych PSYCHIATRIC DISORDERS Anxiety Depression    GI/Hepatic GERD  Medicated and Controlled,  Endo/Other  diabetes, Well Controlled, Type 2Hypothyroidism   Renal/GU      Musculoskeletal   Abdominal   Peds  Hematology   Anesthesia Other Findings   Reproductive/Obstetrics                           Anesthesia Physical Anesthesia Plan  ASA: III  Anesthesia Plan: MAC   Post-op Pain Management:    Induction:   PONV Risk Score and Plan:   Airway Management Planned: Nasal Cannula and Natural Airway  Additional Equipment:   Intra-op Plan:   Post-operative Plan:   Informed Consent: I have reviewed the patients History and Physical, chart, labs and discussed the procedure including the risks, benefits and alternatives for the proposed anesthesia with the patient or authorized representative who has indicated his/her understanding and acceptance.     Dental advisory given  Plan Discussed with: CRNA and Surgeon  Anesthesia Plan Comments:        Anesthesia Quick Evaluation

## 2019-09-25 NOTE — Transfer of Care (Signed)
Immediate Anesthesia Transfer of Care Note  Patient: Dana Lawson  Procedure(s) Performed: CATARACT EXTRACTION PHACO AND INTRAOCULAR LENS PLACEMENT LEFT EYE WITH PLACEMENT OF CORTICOSTEROID (Left Eye)  Patient Location: Short Stay  Anesthesia Type:MAC  Level of Consciousness: awake, alert  and oriented  Airway & Oxygen Therapy: Patient Spontanous Breathing  Post-op Assessment: Report given to RN, Post -op Vital signs reviewed and stable and Patient moving all extremities X 4  Post vital signs: Reviewed and stable  Last Vitals:  Vitals Value Taken Time  BP    Temp    Pulse    Resp    SpO2      Last Pain:  Vitals:   09/25/19 1044  TempSrc: Oral  PainSc: 0-No pain         Complications: No complications documented.

## 2019-09-25 NOTE — Op Note (Signed)
Date of procedure: 09/25/19  Pre-operative diagnosis: Visually significant age-related combined cataract, Left Eye (H25.812)  Post-operative diagnosis:  1. Visually significant age-related combined cataract, Left Eye (H25.812) 2.   Pain and inflammation following cataract surgery, Left Eye (H57.12)  Procedure:  1. Removal of cataract via phacoemulsification and insertion of intra-ocular lens [place]  +24.0D into the capsular bag of the Left Eye 2. Placement of Dextenza Implant, Left Lower Lid  Attending surgeon: Gerda Diss. Oluwasemilore Bahl, MD, MA  Anesthesia: MAC, Topical [place]  Complications: None  Estimated Blood Loss: <61m (minimal)  Specimens: None  Implants: As above  Indications:  Visually significant age-related cataract, Left Eye  Procedure:  The patient was seen and identified in the pre-operative area. The operative eye was identified and dilated.  The operative eye was marked.  Topical anesthesia was administered to the operative eye.     The patient was then to the operative suite and placed in the supine position.  A timeout was performed confirming the patient, procedure to be performed, and all other relevant information.   The patient's face was prepped and draped in the usual fashion for intra-ocular surgery.  A lid speculum was placed into the operative eye and the surgical microscope moved into place and focused.  An inferotemporal paracentesis was created using a 20 gauge paracentesis blade.  Shugarcaine was injected into the anterior chamber.  Viscoelastic was injected into the anterior chamber.  A temporal clear-corneal main wound incision was created using a 2.470mmicrokeratome.  A continuous curvilinear capsulorrhexis was initiated using an irrigating cystitome and completed using capsulorrhexis forceps.  Hydrodissection and hydrodeliniation were performed.  Viscoelastic was injected into the anterior chamber.  A phacoemulsification handpiece and a  chopper as a second instrument were used to remove the nucleus and epinucleus. The irrigation/aspiration handpiece was used to remove any remaining cortical material.   The capsular bag was reinflated with viscoelastic, checked, and found to be intact.  The intraocular lens was inserted into the capsular bag.  The irrigation/aspiration handpiece was used to remove any remaining viscoelastic.  The clear corneal wound and paracentesis wounds were then hydrated and checked with Weck-Cels to be watertight.  The lid-speculum was removed.    A Dextenza implant was placed in the lower canaliculus.   The drape was removed.  The patient's face was cleaned with a wet and dry 4x4.   A clear shield was taped over the eye. The patient was taken to the post-operative care unit in good condition, having tolerated the procedure well.  Post-Op Instructions: [facility] and will receive all other orders and instructions.

## 2019-09-25 NOTE — Interval H&P Note (Signed)
History and Physical Interval Note:  09/25/2019 10:50 AM  Dana Lawson  has presented today for surgery, with the diagnosis of Nuclear sclerotic cataract - Left eye.  The various methods of treatment have been discussed with the patient and family. After consideration of risks, benefits and other options for treatment, the patient has consented to  Procedure(s) with comments: CATARACT EXTRACTION PHACO AND INTRAOCULAR LENS PLACEMENT (IOC) (Left) - CDE:  as a surgical intervention.  The patient's history has been reviewed, patient examined, no change in status, stable for surgery.  I have reviewed the patient's chart and labs.  Questions were answered to the patient's satisfaction.     Fabio Pierce

## 2019-09-25 NOTE — Anesthesia Postprocedure Evaluation (Signed)
Anesthesia Post Note  Patient: Dana Lawson  Procedure(s) Performed: CATARACT EXTRACTION PHACO AND INTRAOCULAR LENS PLACEMENT LEFT EYE WITH PLACEMENT OF CORTICOSTEROID (Left Eye)  Patient location during evaluation: Short Stay Anesthesia Type: MAC Level of consciousness: awake and alert and oriented Pain management: pain level controlled Vital Signs Assessment: post-procedure vital signs reviewed and stable Respiratory status: spontaneous breathing Cardiovascular status: blood pressure returned to baseline Postop Assessment: no headache and no apparent nausea or vomiting Anesthetic complications: no   No complications documented.   Last Vitals:  Vitals:   09/25/19 1044 09/25/19 1121  BP: (!) 184/74 (!) 169/70  Pulse: 74 73  Resp: 20 18  Temp: 36.5 C 36.8 C  SpO2: 98% 95%    Last Pain:  Vitals:   09/25/19 1121  TempSrc: Oral  PainSc: 0-No pain                 Orlie Dakin

## 2019-09-25 NOTE — Anesthesia Procedure Notes (Signed)
Procedure Name: MAC Date/Time: 09/25/2019 11:05 AM Performed by: Orlie Dakin, CRNA Pre-anesthesia Checklist: Patient identified, Emergency Drugs available, Suction available and Patient being monitored Patient Re-evaluated:Patient Re-evaluated prior to induction Oxygen Delivery Method: Nasal cannula Placement Confirmation: positive ETCO2

## 2019-09-25 NOTE — Discharge Instructions (Signed)
Please discharge patient when stable, will follow up today with Dr. Terre Zabriskie at the [facility] [place] office immediately following discharge.  Leave shield in place until visit.  All paperwork with discharge instructions will be given at the office.  [facility] Address:  [address]  [place], [place] [address]  

## 2019-10-01 NOTE — H&P (Signed)
Surgical History & Physical  Patient Name: Dana Lawson DOB: 01-Mar-1945  Surgery: Cataract extraction with intraocular lens implant phacoemulsification; Right Eye  Surgeon: Fabio Pierce MD Surgery Date:  10/09/2019 Pre-Op Date:  10/01/2019  HPI: A 3 Yr. old female patient The patient is returning after cataract surgery. The left eye is affected. Status post cataract surgery, which began 1 weeks ago: Since the last visit, the affected area feels improvement and is doing well. Pt states she can notice white line in peripheral vision in darker setting. States it looks like a reflection of light. The patient's vision is improved and stable. Patient is following medication instructions. Omni TID OD. Has not missed any doses. The patient experiences no flashes and no increase floaters. Pt c/o a decrease in vision, especially at night time when driving. Pt c/o headlights being bothersome, and glare makes it difficult to see. The right eye affected. This is negatively affecting the patient's quality of life. HPI was performed by Fabio Pierce .  Medical History: Dry Eyes Cataracts Diabetes High Blood Pressure Hypercholesterolemia Thyroid Problems  Review of Systems Negative Allergic/Immunologic Negative Cardiovascular Negative Constitutional Negative Ear, Nose, Mouth & Throat Negative Endocrine Negative Eyes Negative Gastrointestinal Negative Genitourinary Negative Hemotologic/Lymphatic Negative Integumentary Negative Musculoskeletal Negative Neurological Negative Psychiatry Negative Respiratory  Social   Former smoker of Cigarettes   Medication Prednisolone-gatiflox-bromfenac,  Benazepril, Bupropion HCL, Clonazepam, Lurosemide, Glipizide, Meloxicam, Metformin, Omeprazole, Potassium chloride, Rosuvastatin, Synthroid, Tricore,   Sx/Procedures Phaco c IOL with Dextenza,  C Section,   Drug Allergies  Flagyl,   History & Physical: Heent:  Cataract, Right eye NECK: supple  without bruits LUNGS: lungs clear to auscultation CV: regular rate and rhythm Abdomen: soft and non-tender  Impression & Plan: Assessment: 1.  COMBINED FORMS AGE RELATED CATARACT; , Right Eye (H25.811) 2.  CATARACT EXTRACTION STATUS; Left Eye (Z98.42) 3.  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  Cataract accounts for the patient's decreased vision. This visual impairment is not correctable with a tolerable change in glasses or contact lenses. Cataract surgery with an implantation of a new lens should significantly improve the visual and functional status of the patient. Discussed all risks, benefits, alternatives, and potential complications. Discussed the procedures and recovery. Patient desires to have surgery. A-scan ordered and performed today for intra-ocular lens calculations. The surgery will be performed in order to improve vision for driving, reading, and for eye examinations. Recommend phacoemulsification with intra-ocular lens. Recommend Dextenza for post-operative pain and inflammation. Right Eye. Surgery required to correct imbalance of vision. Dilates well - shugarcaine by protocol. 2.  1 week after cataract surgery. Doing well with improved vision and normal eye pressure. Call with any problems or concerns. Stop all drops. Call with any concerning symptoms. 3.  Stable.

## 2019-10-02 ENCOUNTER — Other Ambulatory Visit: Payer: Self-pay | Admitting: "Endocrinology

## 2019-10-05 DIAGNOSIS — H25811 Combined forms of age-related cataract, right eye: Secondary | ICD-10-CM | POA: Diagnosis not present

## 2019-10-07 ENCOUNTER — Other Ambulatory Visit (HOSPITAL_COMMUNITY)
Admission: RE | Admit: 2019-10-07 | Discharge: 2019-10-07 | Disposition: A | Discharge status: Home / Self Care | Payer: Medicare Other | Source: Ambulatory Visit | Attending: Ophthalmology | Admitting: Ophthalmology

## 2019-10-07 ENCOUNTER — Other Ambulatory Visit: Payer: Self-pay

## 2019-10-07 ENCOUNTER — Encounter (HOSPITAL_COMMUNITY)
Admission: RE | Admit: 2019-10-07 | Discharge: 2019-10-07 | Disposition: A | Discharge status: Home / Self Care | Payer: Medicare Other | Source: Ambulatory Visit | Attending: Ophthalmology | Admitting: Ophthalmology

## 2019-10-07 DIAGNOSIS — Z20822 Contact with and (suspected) exposure to covid-19: Secondary | ICD-10-CM | POA: Insufficient documentation

## 2019-10-07 LAB — SARS CORONAVIRUS 2 (TAT 6-24 HRS): SARS Coronavirus 2: NEGATIVE

## 2019-10-07 NOTE — Patient Instructions (Signed)
Dana Lawson  10/07/2019     @PREFPERIOPPHARMACY @   Your procedure is scheduled on 10/09/2019.  Report to [facility] at 7:00 A.M.  Call this number if you have problems the morning of surgery:  [phone]   Remember:  Do not eat or drink after midnight.      Take these medicines the morning of surgery with A SIP OF WATER : Wellbutrin and Synthroid    Do not wear jewelry, make-up or nail polish.  Do not wear lotions, powders, or perfumes, or deodorant.  Do not shave 48 hours prior to surgery.  Men may shave face and neck.  Do not bring valuables to the hospital.  [facility] is not responsible for any belongings or valuables.  Contacts, dentures or bridgework may not be worn into surgery.  Leave your suitcase in the car.  After surgery it may be brought to your room.  For patients admitted to the hospital, discharge time will be determined by your treatment team.  Patients discharged the day of surgery will not be allowed to drive home.   Name and phone number of your driver:   family Special instructions:  n/a  Please read over the following fact sheets that you were given. Care and Recovery After Surgery  Cataract Surgery Cataract surgery is a procedure to remove a cloudy lens (cataract) in the eye. The lens focuses light inside the eye. When a lens becomes cloudy, your vision is affected. Another lens (intraocular lens or IOL) is usually inserted to replace the cloudy lens and to properly focus light in the eye. Cataract surgery is usually recommended when the cataract is causing problems with daily functioning, such as reading, watching television, or driving. Tell a health care provider about:  Any allergies you have.  All medicines you are taking, including vitamins, herbs, eye drops, creams, and over-the-counter medicines.  Any problems you or family members have had with anesthetic medicines.  Any blood disorders you have.  Any surgeries you have had,  especially eye surgeries that include refractive surgery, such as PRK and LASIK.  Any medical conditions you have.  Whether you are pregnant or may be pregnant. What are the risks? Generally, this is a safe procedure. However, problems may occur, including:  Infection.  Bleeding.  High pressure in the eye (glaucoma).  Detachment of the retina.  Corneal swelling.  Allergic reactions to medicines.  Damage to other structures or organs.  Inflammation of the eye.  Clouding of the part of your eye that holds an IOL in place (after-cataract). This is common and can be treated at a later date with laser surgery.  An IOL moving out of position (rare).  Loss of vision (rare). What happens before the procedure? Staying hydrated Follow instructions from your health care provider about hydration, which may include:  Up to 2 to 6 hours before the procedure - you may continue to drink clear liquids, such as water, clear fruit juice, black coffee, and plain tea. Exact instructions will be given by your health care provider.  Eating and drinking restrictions Follow instructions from your health care provider about eating and drinking, which may include:  8 hours before the procedure - stop eating heavy meals or foods, such as meat, fried foods, or fatty foods.  6-8 hours before the procedure - stop eating light meals or foods, such as toast or cereal.  6-8 hours before the procedure - stop drinking milk or drinks that contain milk.  2-6 hours before the procedure - stop drinking clear liquids. Medicines Ask your health care provider about:  Changing or stopping your regular medicines. This is especially important if you are taking diabetes medicines or blood thinners.  Taking medicines such as aspirin and ibuprofen. These medicines can thin your blood. Do not take these medicines unless your health care provider tells you to take them.  Taking over-the-counter medicines, vitamins,  herbs, and supplements. General instructions  Do not put contact lenses in either eye on the day of your surgery.  Plan to have someone take you home from the hospital or clinic.  If you will be going home right after the procedure, plan to have someone with you for 24 hours.  Ask your health care provider what steps will be taken to help prevent infection. These may include: ? Washing skin with a germ-killing soap. ? Taking antibiotic medicine. What happens during the procedure?      An IV may be inserted into one of your veins.  You will be given one or both of the following: ? A medicine to help you relax (sedative). ? A medicine to numb the area (local anesthetic). This may be numbing eye drops or an injection that is given behind the eye.  A small cut (incision) will be made to the edge of the clear surface that covers the front of the eye (cornea).  A small probe will be inserted into the eye. This device gives off ultrasound waves that soften and break up the cloudy center of the lens. This makes it easier for the cataract to be removed.  The cataract will be removed by suction.  An IOL may be implanted.  Part of the capsule that surrounds the lens will be left in the eye to support the IOL.  Your surgeon may use stitches (sutures) to close the incision. The procedure may vary among health care providers and hospitals. What happens after the procedure?  Your blood pressure, heart rate, breathing rate, and blood oxygen level will be monitored until you leave the hospital or clinic.  You may be given a protective shield to wear over your eye.  You may be given medicines to relieve discomfort and swelling. Some medicines may be in the form of eye drops.  Do not drive for 24 hours if you were given a sedative during your procedure. Summary  Cataract surgery is a procedure to remove a cloudy lens (cataract) in the eye.  This is a safe procedure. However, problems may  occur, including infection, bleeding, glaucoma, inflammation, and loss of vision.  Follow your health care provider's instructions about when to stop eating and drinking and whether to change or stop certain medicines.  After the procedure, you may be given medicines or an eye shield to wear over your eye.  Do not drive for 24 hours if you were given a sedative during your procedure. This information is not intended to replace advice given to you by your health care provider. Make sure you discuss any questions you have with your health care provider. Document Revised: 10/21/2018 Document Reviewed: 06/24/2017 Elsevier Patient Education  2020 [employer].

## 2019-10-08 DIAGNOSIS — E7800 Pure hypercholesterolemia, unspecified: Secondary | ICD-10-CM | POA: Diagnosis not present

## 2019-10-08 DIAGNOSIS — M069 Rheumatoid arthritis, unspecified: Secondary | ICD-10-CM | POA: Diagnosis not present

## 2019-10-08 DIAGNOSIS — E119 Type 2 diabetes mellitus without complications: Secondary | ICD-10-CM | POA: Diagnosis not present

## 2019-10-08 DIAGNOSIS — J449 Chronic obstructive pulmonary disease, unspecified: Secondary | ICD-10-CM | POA: Diagnosis not present

## 2019-10-09 ENCOUNTER — Encounter (HOSPITAL_COMMUNITY)
Admission: RE | Disposition: A | Discharge status: Home / Self Care | Payer: Self-pay | Source: Home / Self Care | Attending: Ophthalmology

## 2019-10-09 ENCOUNTER — Encounter (HOSPITAL_COMMUNITY): Payer: Self-pay | Admitting: Ophthalmology

## 2019-10-09 ENCOUNTER — Ambulatory Visit (HOSPITAL_COMMUNITY): Payer: Medicare Other | Admitting: Anesthesiology

## 2019-10-09 ENCOUNTER — Ambulatory Visit (HOSPITAL_COMMUNITY)
Admission: RE | Admit: 2019-10-09 | Discharge: 2019-10-09 | Disposition: A | Discharge status: Home / Self Care | Payer: Medicare Other | Attending: Ophthalmology | Admitting: Ophthalmology

## 2019-10-09 DIAGNOSIS — K219 Gastro-esophageal reflux disease without esophagitis: Secondary | ICD-10-CM | POA: Diagnosis not present

## 2019-10-09 DIAGNOSIS — H25811 Combined forms of age-related cataract, right eye: Secondary | ICD-10-CM | POA: Diagnosis not present

## 2019-10-09 DIAGNOSIS — H18519 Endothelial corneal dystrophy, unspecified eye: Secondary | ICD-10-CM | POA: Diagnosis not present

## 2019-10-09 DIAGNOSIS — Z791 Long term (current) use of non-steroidal anti-inflammatories (NSAID): Secondary | ICD-10-CM | POA: Insufficient documentation

## 2019-10-09 DIAGNOSIS — Z87891 Personal history of nicotine dependence: Secondary | ICD-10-CM | POA: Diagnosis not present

## 2019-10-09 DIAGNOSIS — E119 Type 2 diabetes mellitus without complications: Secondary | ICD-10-CM | POA: Diagnosis not present

## 2019-10-09 DIAGNOSIS — E039 Hypothyroidism, unspecified: Secondary | ICD-10-CM | POA: Diagnosis not present

## 2019-10-09 DIAGNOSIS — J449 Chronic obstructive pulmonary disease, unspecified: Secondary | ICD-10-CM | POA: Diagnosis not present

## 2019-10-09 DIAGNOSIS — E1136 Type 2 diabetes mellitus with diabetic cataract: Secondary | ICD-10-CM | POA: Insufficient documentation

## 2019-10-09 DIAGNOSIS — E7800 Pure hypercholesterolemia, unspecified: Secondary | ICD-10-CM | POA: Insufficient documentation

## 2019-10-09 DIAGNOSIS — Z7984 Long term (current) use of oral hypoglycemic drugs: Secondary | ICD-10-CM | POA: Diagnosis not present

## 2019-10-09 DIAGNOSIS — Z79890 Hormone replacement therapy (postmenopausal): Secondary | ICD-10-CM | POA: Diagnosis not present

## 2019-10-09 DIAGNOSIS — Z881 Allergy status to other antibiotic agents status: Secondary | ICD-10-CM | POA: Insufficient documentation

## 2019-10-09 DIAGNOSIS — Z79899 Other long term (current) drug therapy: Secondary | ICD-10-CM | POA: Diagnosis not present

## 2019-10-09 DIAGNOSIS — Z9842 Cataract extraction status, left eye: Secondary | ICD-10-CM | POA: Diagnosis not present

## 2019-10-09 DIAGNOSIS — I10 Essential (primary) hypertension: Secondary | ICD-10-CM | POA: Diagnosis not present

## 2019-10-09 DIAGNOSIS — H5711 Ocular pain, right eye: Secondary | ICD-10-CM | POA: Diagnosis not present

## 2019-10-09 LAB — GLUCOSE, CAPILLARY: Glucose-Capillary: 137 mg/dL — ABNORMAL HIGH (ref 70–99)

## 2019-10-09 SURGERY — CATARACT EXTRACTION PHACO AND INTRAOCULAR LENS PLACEMENT (IOC) with placement of Corticosteroid
Anesthesia: Monitor Anesthesia Care | Site: Eye | Laterality: Right

## 2019-10-09 MED ORDER — BSS IO SOLN
INTRAOCULAR | Status: DC | PRN
  Administered 2019-10-09: 15 mL via INTRAOCULAR

## 2019-10-09 MED ORDER — LIDOCAINE HCL 3.5 % OP GEL
1.0000 "application " | Freq: Once | OPHTHALMIC | Status: AC
  Administered 2019-10-09: 1 via OPHTHALMIC

## 2019-10-09 MED ORDER — MIDAZOLAM HCL 2 MG/2ML IJ SOLN
INTRAMUSCULAR | Status: AC
  Filled 2019-10-09: qty 2

## 2019-10-09 MED ORDER — PROVISC 10 MG/ML IO SOLN
INTRAOCULAR | Status: DC | PRN
  Administered 2019-10-09: 0.85 mL via INTRAOCULAR

## 2019-10-09 MED ORDER — POVIDONE-IODINE 5 % OP SOLN
OPHTHALMIC | Status: DC | PRN
  Administered 2019-10-09: 1 via OPHTHALMIC

## 2019-10-09 MED ORDER — DEXAMETHASONE 0.4 MG OP INST
VAGINAL_INSERT | OPHTHALMIC | Status: DC | PRN
  Administered 2019-10-09: 0.4 mg via OPHTHALMIC

## 2019-10-09 MED ORDER — DEXAMETHASONE 0.4 MG OP INST
VAGINAL_INSERT | OPHTHALMIC | Status: AC
  Filled 2019-10-09: qty 1

## 2019-10-09 MED ORDER — EPINEPHRINE PF 1 MG/ML IJ SOLN
INTRAOCULAR | Status: DC | PRN
  Administered 2019-10-09: 500 mL

## 2019-10-09 MED ORDER — PHENYLEPHRINE HCL 2.5 % OP SOLN
1.0000 [drp] | OPHTHALMIC | Status: AC | PRN
  Administered 2019-10-09 (×3): 1 [drp] via OPHTHALMIC

## 2019-10-09 MED ORDER — CYCLOPENTOLATE-PHENYLEPHRINE 0.2-1 % OP SOLN
1.0000 [drp] | OPHTHALMIC | Status: AC | PRN
  Administered 2019-10-09 (×3): 1 [drp] via OPHTHALMIC

## 2019-10-09 MED ORDER — SODIUM HYALURONATE 23 MG/ML IO SOLN
INTRAOCULAR | Status: DC | PRN
  Administered 2019-10-09: 0.6 mL via INTRAOCULAR

## 2019-10-09 MED ORDER — LIDOCAINE HCL (PF) 1 % IJ SOLN
INTRAOCULAR | Status: DC | PRN
  Administered 2019-10-09: 1 mL via OPHTHALMIC

## 2019-10-09 MED ORDER — EPINEPHRINE PF 1 MG/ML IJ SOLN
INTRAMUSCULAR | Status: AC
  Filled 2019-10-09: qty 2

## 2019-10-09 MED ORDER — TETRACAINE HCL 0.5 % OP SOLN
1.0000 [drp] | OPHTHALMIC | Status: AC | PRN
  Administered 2019-10-09 (×3): 1 [drp] via OPHTHALMIC

## 2019-10-09 MED ORDER — MIDAZOLAM HCL 5 MG/5ML IJ SOLN
INTRAMUSCULAR | Status: DC | PRN
  Administered 2019-10-09 (×2): 1 mg via INTRAVENOUS

## 2019-10-09 SURGICAL SUPPLY — 15 items
CLOTH BEACON ORANGE TIMEOUT ST (SAFETY) ×2 IMPLANT
EYE SHIELD UNIVERSAL CLEAR (GAUZE/BANDAGES/DRESSINGS) ×2 IMPLANT
GLOVE BIOGEL PI IND STRL 6.5 (GLOVE) ×1 IMPLANT
GLOVE BIOGEL PI IND STRL 7.0 (GLOVE) ×1 IMPLANT
GLOVE BIOGEL PI INDICATOR 6.5 (GLOVE) ×1
GLOVE BIOGEL PI INDICATOR 7.0 (GLOVE) ×1
LENS ALC ACRYL/TECN (Ophthalmic Related) ×2 IMPLANT
NDL HYPO 18GX1.5 BLUNT FILL (NEEDLE) IMPLANT
NEEDLE HYPO 18GX1.5 BLUNT FILL (NEEDLE) ×3 IMPLANT
PAD ARMBOARD 7.5X6 YLW CONV (MISCELLANEOUS) ×2 IMPLANT
SYR TB 1ML LL NO SAFETY (SYRINGE) ×2 IMPLANT
TAPE SURG TRANSPORE 1 IN (GAUZE/BANDAGES/DRESSINGS) ×1 IMPLANT
TAPE SURGICAL TRANSPORE 1 IN (GAUZE/BANDAGES/DRESSINGS) ×3
VISCOELASTIC ADDITIONAL (OPHTHALMIC RELATED) ×2 IMPLANT
WATER STERILE IRR 250ML POUR (IV SOLUTION) ×2 IMPLANT

## 2019-10-09 NOTE — Discharge Instructions (Signed)
Please discharge patient when stable, will follow up today with Dr. Raegan Winders at the [facility] [place] office immediately following discharge.  Leave shield in place until visit.  All paperwork with discharge instructions will be given at the office.  [facility] Address:  [address]  [place], [place] [address]             Monitored Anesthesia Care, Care After These instructions provide you with information about caring for yourself after your procedure. Your health care provider may also give you more specific instructions. Your treatment has been planned according to current medical practices, but problems sometimes occur. Call your health care provider if you have any problems or questions after your procedure. What can I expect after the procedure? After your procedure, you may:  Feel sleepy for several hours.  Feel clumsy and have poor balance for several hours.  Feel forgetful about what happened after the procedure.  Have poor judgment for several hours.  Feel nauseous or vomit.  Have a sore throat if you had a breathing tube during the procedure. Follow these instructions at home: For at least 24 hours after the procedure:      Have a responsible adult stay with you. It is important to have someone help care for you until you are awake and alert.  Rest as needed.  Do not: ? Participate in activities in which you could fall or become injured. ? Drive. ? Use heavy machinery. ? Drink alcohol. ? Take sleeping pills or medicines that cause drowsiness. ? Make important decisions or sign legal documents. ? Take care of children on your own. Eating and drinking  Follow the diet that is recommended by your health care provider.  If you vomit, drink water, juice, or soup when you can drink without vomiting.  Make sure you have little or no nausea before eating solid foods. General instructions  Take over-the-counter and  prescription medicines only as told by your health care provider.  If you have sleep apnea, surgery and certain medicines can increase your risk for breathing problems. Follow instructions from your health care provider about wearing your sleep device: ? Anytime you are sleeping, including during daytime naps. ? While taking prescription pain medicines, sleeping medicines, or medicines that make you drowsy.  If you smoke, do not smoke without supervision.  Keep all follow-up visits as told by your health care provider. This is important. Contact a health care provider if:  You keep feeling nauseous or you keep vomiting.  You feel light-headed.  You develop a rash.  You have a fever. Get help right away if:  You have trouble breathing. Summary  For several hours after your procedure, you may feel sleepy and have poor judgment.  Have a responsible adult stay with you for at least 24 hours or until you are awake and alert. This information is not intended to replace advice given to you by your health care provider. Make sure you discuss any questions you have with your health care provider. Document Revised: 03/25/2017 Document Reviewed: 04/17/2015 Elsevier Patient Education  2020 Elsevier Inc.  

## 2019-10-09 NOTE — Interval H&P Note (Signed)
History and Physical Interval Note:  10/09/2019 8:22 AM  Dana Lawson  has presented today for surgery, with the diagnosis of Nuclear sclerotic cataract - Right eye.  The various methods of treatment have been discussed with the patient and family. After consideration of risks, benefits and other options for treatment, the patient has consented to  Procedure(s) with comments: CATARACT EXTRACTION PHACO AND INTRAOCULAR LENS PLACEMENT RIGHT EYE (Right) - CDE:  as a surgical intervention.  The patient's history has been reviewed, patient examined, no change in status, stable for surgery.  I have reviewed the patient's chart and labs.  Questions were answered to the patient's satisfaction.     Fabio Pierce

## 2019-10-09 NOTE — Op Note (Signed)
Date of procedure: 10/09/19  Pre-operative diagnosis:  Visually significant combined form age-related cataract, Right Eye (H25.811)  Post-operative diagnosis:   1. Visually significant combined form age-related cataract, Right Eye (H25.811) 2. Pain and inflammation following cataract surgery Right Eye (H57.11)  Procedure:  1. Removal of cataract via phacoemulsification and insertion of intra-ocular lens [place]  +25.0D into the capsular bag of the Right Eye 2. Placement of Dextenza insert, Right Eye  Attending surgeon: Gerda Diss. Wahneta Derocher, MD, MA  Anesthesia: MAC, Topical [place]  Complications: None  Estimated Blood Loss: <58m (minimal)  Specimens: None  Implants: As above  Indications:  Visually significant age-related cataract, Right Eye  Procedure:  The patient was seen and identified in the pre-operative area. The operative eye was identified and dilated.  The operative eye was marked.  Topical anesthesia was administered to the operative eye.     The patient was then to the operative suite and placed in the supine position.  A timeout was performed confirming the patient, procedure to be performed, and all other relevant information.   The patient's face was prepped and draped in the usual fashion for intra-ocular surgery.  A lid speculum was placed into the operative eye and the surgical microscope moved into place and focused.  A superotemporal paracentesis was created using a 20 gauge paracentesis blade.  Shugarcaine was injected into the anterior chamber.  Viscoelastic was injected into the anterior chamber.  A temporal clear-corneal main wound incision was created using a 2.48mmicrokeratome.  A continuous curvilinear capsulorrhexis was initiated using an irrigating cystitome and completed using capsulorrhexis forceps.  Hydrodissection and hydrodeliniation were performed.  Viscoelastic was injected into the anterior chamber.  A phacoemulsification handpiece  and a chopper as a second instrument were used to remove the nucleus and epinucleus. The irrigation/aspiration handpiece was used to remove any remaining cortical material.   The capsular bag was reinflated with viscoelastic, checked, and found to be intact.  The intraocular lens was inserted into the capsular bag.  The irrigation/aspiration handpiece was used to remove any remaining viscoelastic.  The clear corneal wound and paracentesis wounds were then hydrated and checked with Weck-Cels to be watertight.  The lid-speculum was removed.   A Dextenza implant was placed in the lower canaliculus.   The drape was removed.  The patient's face was cleaned with a wet and dry 4x4. A clear shield was taped over the eye. The patient was taken to the post-operative care unit in good condition, having tolerated the procedure well.  Post-Op Instructions: [facility] and will receive all other orders and instructions.

## 2019-10-09 NOTE — Anesthesia Postprocedure Evaluation (Signed)
Anesthesia Post Note  Patient: Dana Lawson  Procedure(s) Performed: CATARACT EXTRACTION PHACO AND INTRAOCULAR LENS PLACEMENT RIGHT EYE with placement of corticosteroid (Right Eye)  Patient location during evaluation: Phase II Anesthesia Type: MAC Level of consciousness: awake, oriented, awake and alert and patient cooperative Pain management: pain level controlled Vital Signs Assessment: post-procedure vital signs reviewed and stable Respiratory status: spontaneous breathing, respiratory function stable and nonlabored ventilation Cardiovascular status: blood pressure returned to baseline and stable Postop Assessment: no headache and no backache Anesthetic complications: no   No complications documented.   Last Vitals:  Vitals:   10/09/19 0728  BP: (!) 180/78  Pulse: 77  Resp: 18  Temp: 36.9 C  SpO2: 96%    Last Pain:  Vitals:   10/09/19 0728  TempSrc: Oral  PainSc: 0-No pain                 Tacy Learn

## 2019-10-09 NOTE — Transfer of Care (Signed)
Immediate Anesthesia Transfer of Care Note  Patient: Dana Lawson  Procedure(s) Performed: CATARACT EXTRACTION PHACO AND INTRAOCULAR LENS PLACEMENT RIGHT EYE with placement of corticosteroid (Right Eye)  Patient Location: Endoscopy Unit  Anesthesia Type:MAC  Level of Consciousness: awake, alert , oriented and patient cooperative  Airway & Oxygen Therapy: Patient Spontanous Breathing  Post-op Assessment: Report given to RN, Post -op Vital signs reviewed and stable and Patient moving all extremities  Post vital signs: Reviewed and stable  Last Vitals:  Vitals Value Taken Time  BP    Temp    Pulse    Resp    SpO2      Last Pain:  Vitals:   10/09/19 0728  TempSrc: Oral  PainSc: 0-No pain      Patients Stated Pain Goal: 8 (88/33/74 4514)  Complications: No complications documented.

## 2019-10-09 NOTE — Anesthesia Preprocedure Evaluation (Addendum)
Anesthesia Evaluation  Patient identified by MRN, date of birth, ID band Patient awake    Reviewed: Allergy & Precautions, NPO status , Patient's Chart, lab work & pertinent test results  History of Anesthesia Complications Negative for: history of anesthetic complications  Airway Mallampati: III  TM Distance: >3 FB Neck ROM: Full    Dental  (+) Partial Lower   Pulmonary COPD, former smoker,    Pulmonary exam normal breath sounds clear to auscultation       Cardiovascular hypertension, Pt. on medications Normal cardiovascular exam Rhythm:Regular Rate:Normal     Neuro/Psych PSYCHIATRIC DISORDERS Anxiety Depression    GI/Hepatic GERD  Medicated and Controlled,  Endo/Other  diabetes, Well Controlled, Type 2, Oral Hypoglycemic AgentsHypothyroidism   Renal/GU      Musculoskeletal   Abdominal   Peds  Hematology   Anesthesia Other Findings   Reproductive/Obstetrics                            Anesthesia Physical  Anesthesia Plan  ASA: III  Anesthesia Plan: MAC   Post-op Pain Management:    Induction:   PONV Risk Score and Plan:   Airway Management Planned: Nasal Cannula and Natural Airway  Additional Equipment:   Intra-op Plan:   Post-operative Plan:   Informed Consent: I have reviewed the patients History and Physical, chart, labs and discussed the procedure including the risks, benefits and alternatives for the proposed anesthesia with the patient or authorized representative who has indicated his/her understanding and acceptance.     Dental advisory given  Plan Discussed with: CRNA and Surgeon  Anesthesia Plan Comments:         Anesthesia Quick Evaluation

## 2019-10-26 ENCOUNTER — Other Ambulatory Visit: Payer: Self-pay

## 2019-10-26 ENCOUNTER — Telehealth: Payer: Self-pay | Admitting: "Endocrinology

## 2019-10-26 DIAGNOSIS — E118 Type 2 diabetes mellitus with unspecified complications: Secondary | ICD-10-CM

## 2019-10-26 MED ORDER — METFORMIN HCL 1000 MG PO TABS: 1000.0000 mg | ORAL_TABLET | Freq: Two times a day (BID) | ORAL | 1 refills | Status: DC

## 2019-10-26 NOTE — Telephone Encounter (Signed)
done

## 2019-10-26 NOTE — Telephone Encounter (Signed)
Pt is calling and requesting a refill on   metFORMIN (GLUCOPHAGE) 1000 MG tablet    [facility] - [place], [place] - [address] Phone:  [phone]  Fax:  [phone]

## 2019-11-02 DIAGNOSIS — L900 Lichen sclerosus et atrophicus: Secondary | ICD-10-CM | POA: Diagnosis not present

## 2019-11-28 DIAGNOSIS — Z23 Encounter for immunization: Secondary | ICD-10-CM | POA: Diagnosis not present

## 2019-11-30 ENCOUNTER — Other Ambulatory Visit: Payer: Self-pay | Admitting: "Endocrinology

## 2019-11-30 DIAGNOSIS — E118 Type 2 diabetes mellitus with unspecified complications: Secondary | ICD-10-CM | POA: Diagnosis not present

## 2019-11-30 DIAGNOSIS — E038 Other specified hypothyroidism: Secondary | ICD-10-CM | POA: Diagnosis not present

## 2019-12-01 DIAGNOSIS — E7800 Pure hypercholesterolemia, unspecified: Secondary | ICD-10-CM | POA: Diagnosis not present

## 2019-12-01 DIAGNOSIS — R5383 Other fatigue: Secondary | ICD-10-CM | POA: Diagnosis not present

## 2019-12-01 DIAGNOSIS — Z7189 Other specified counseling: Secondary | ICD-10-CM | POA: Diagnosis not present

## 2019-12-01 DIAGNOSIS — F17210 Nicotine dependence, cigarettes, uncomplicated: Secondary | ICD-10-CM | POA: Diagnosis not present

## 2019-12-01 DIAGNOSIS — Z79899 Other long term (current) drug therapy: Secondary | ICD-10-CM | POA: Diagnosis not present

## 2019-12-01 DIAGNOSIS — Z6834 Body mass index (BMI) 34.0-34.9, adult: Secondary | ICD-10-CM | POA: Diagnosis not present

## 2019-12-01 DIAGNOSIS — Z1339 Encounter for screening examination for other mental health and behavioral disorders: Secondary | ICD-10-CM | POA: Diagnosis not present

## 2019-12-01 DIAGNOSIS — E039 Hypothyroidism, unspecified: Secondary | ICD-10-CM | POA: Diagnosis not present

## 2019-12-01 DIAGNOSIS — Z1331 Encounter for screening for depression: Secondary | ICD-10-CM | POA: Diagnosis not present

## 2019-12-01 DIAGNOSIS — Z299 Encounter for prophylactic measures, unspecified: Secondary | ICD-10-CM | POA: Diagnosis not present

## 2019-12-01 DIAGNOSIS — Z0000 Encounter for general adult medical examination without abnormal findings: Secondary | ICD-10-CM | POA: Diagnosis not present

## 2019-12-01 DIAGNOSIS — I10 Essential (primary) hypertension: Secondary | ICD-10-CM | POA: Diagnosis not present

## 2019-12-01 LAB — LIPID PANEL
Cholesterol: 208 mg/dL — ABNORMAL HIGH (ref <200)
HDL: 44 mg/dL — ABNORMAL LOW (ref >=50)
LDL Cholesterol (Calc): 119 mg/dL (calc) — ABNORMAL HIGH
Non-HDL Cholesterol (Calc): 164 mg/dL (calc) — ABNORMAL HIGH (ref <130)
Total CHOL/HDL Ratio: 4.7 (calc) (ref <5.0)
Triglycerides: 314 mg/dL — ABNORMAL HIGH (ref <150)

## 2019-12-01 LAB — COMPLETE METABOLIC PANEL WITH GFR
AG Ratio: 1.6 (calc) (ref 1.0–2.5)
ALT: 31 U/L — ABNORMAL HIGH (ref 6–29)
AST: 35 U/L (ref 10–35)
Albumin: 4.3 g/dL (ref 3.6–5.1)
Alkaline phosphatase (APISO): 74 U/L (ref 37–153)
BUN/Creatinine Ratio: 12 (calc) (ref 6–22)
BUN: 15 mg/dL (ref 7–25)
CO2: 27 mmol/L (ref 20–32)
Calcium: 10.2 mg/dL (ref 8.6–10.4)
Chloride: 104 mmol/L (ref 98–110)
Creat: 1.21 mg/dL — ABNORMAL HIGH (ref 0.60–0.93)
GFR, Est African American: 52 mL/min/{1.73_m2} — ABNORMAL LOW (ref >=60)
GFR, Est Non African American: 45 mL/min/{1.73_m2} — ABNORMAL LOW (ref >=60)
Globulin: 2.7 g/dL (calc) (ref 1.9–3.7)
Glucose, Bld: 149 mg/dL — ABNORMAL HIGH (ref 65–99)
Potassium: 4.7 mmol/L (ref 3.5–5.3)
Sodium: 140 mmol/L (ref 135–146)
Total Bilirubin: 0.5 mg/dL (ref 0.2–1.2)
Total Protein: 7 g/dL (ref 6.1–8.1)

## 2019-12-01 LAB — T4, FREE: Free T4: 1.5 ng/dL (ref 0.8–1.8)

## 2019-12-01 LAB — TSH: TSH: 2.81 mIU/L (ref 0.40–4.50)

## 2019-12-07 ENCOUNTER — Encounter: Payer: Self-pay | Admitting: "Endocrinology

## 2019-12-07 ENCOUNTER — Ambulatory Visit (INDEPENDENT_AMBULATORY_CARE_PROVIDER_SITE_OTHER): Payer: Medicare Other | Admitting: "Endocrinology

## 2019-12-07 ENCOUNTER — Other Ambulatory Visit: Payer: Self-pay

## 2019-12-07 VITALS — BP 162/86 | HR 87 | Temp 97.7°F | Resp 18 | Ht 67.5 in

## 2019-12-07 DIAGNOSIS — E118 Type 2 diabetes mellitus with unspecified complications: Secondary | ICD-10-CM | POA: Diagnosis not present

## 2019-12-07 LAB — POCT GLYCOSYLATED HEMOGLOBIN (HGB A1C)
HbA1c, POC (controlled diabetic range): 7.4 % — AB (ref 0.0–7.0)
Hemoglobin A1C: 7.4 % — AB (ref 4.0–5.6)

## 2019-12-07 MED ORDER — LEVOTHYROXINE SODIUM 150 MCG PO TABS
150.0000 ug | ORAL_TABLET | Freq: Every day | ORAL | 1 refills | Status: DC
Start: 2019-12-07 — End: 2020-08-03

## 2019-12-07 MED ORDER — METFORMIN HCL 500 MG PO TABS: 500.0000 mg | ORAL_TABLET | Freq: Two times a day (BID) | ORAL | 1 refills | Status: DC

## 2019-12-07 NOTE — Patient Instructions (Signed)

## 2019-12-07 NOTE — Progress Notes (Signed)
12/07/2019   Endocrinology follow-up note   Subjective:    Patient ID: Dana Lawson, female    DOB: 02-21-1945, PCP Kirstie Peri, MD   Past Medical History:  Diagnosis Date  . Anxiety   . COPD (chronic obstructive pulmonary disease) (HCC)   . Depression   . Essential hypertension   . GERD (gastroesophageal reflux disease)   . Hepatic steatosis   . Hyperlipidemia   . Hypothyroidism   . Type 2 diabetes mellitus (HCC)    Past Surgical History:  Procedure Laterality Date  . LEFT HEART CATH AND CORONARY ANGIOGRAPHY N/A 11/08/2016   Procedure: LEFT HEART CATH AND CORONARY ANGIOGRAPHY;  Surgeon: Marykay Lex, MD;  Location: [facility] INVASIVE CV LAB;  Service: Cardiovascular;  Laterality: N/A;  . TUBAL LIGATION     Social History   Socioeconomic History  . Marital status: Married    Spouse name: Not on file  . Number of children: Not on file  . Years of education: Not on file  . Highest education level: Not on file  Occupational History  . Not on file  Tobacco Use  . Smoking status: Former Smoker    Types: Cigarettes    Quit date: 11/06/2015    Years since quitting: 4.0  . Smokeless tobacco: Never Used  Vaping Use  . Vaping Use: Never used  Substance and Sexual Activity  . Alcohol use: Yes    Alcohol/week: 0.0 standard drinks    Comment: Occasional  . Drug use: No  . Sexual activity: Not on file  Other Topics Concern  . Not on file  Social History Narrative  . Not on file   Social Determinants of Health   Financial Resource Strain:   . Difficulty of Paying Living Expenses: Not on file  Food Insecurity:   . Worried About Programme researcher, broadcasting/film/video in the Last Year: Not on file  . Ran Out of Food in the Last Year: Not on file  Transportation Needs:   . Lack of Transportation (Medical): Not on file  . Lack of Transportation (Non-Medical): Not on file  Physical Activity:   . Days of Exercise per Week: Not on file  . Minutes of Exercise per Session: Not on file   Stress:   . Feeling of Stress : Not on file  Social Connections:   . Frequency of Communication with Friends and Family: Not on file  . Frequency of Social Gatherings with Friends and Family: Not on file  . Attends Religious Services: Not on file  . Active Member of Clubs or Organizations: Not on file  . Attends Banker Meetings: Not on file  . Marital Status: Not on file   Outpatient Encounter Medications as of 12/07/2019  Medication Sig  . aspirin 81 MG tablet Take 81 mg by mouth 3 (three) times a week.   . benazepril (LOTENSIN) 40 MG tablet Take 40 mg by mouth daily.  . Blood Glucose Monitoring Suppl (FREESTYLE LITE) DEVI 1 each by Does not apply route 2 (two) times daily. Use as instructed to check blood glucose two times a day.  Marland Kitchen buPROPion (WELLBUTRIN SR) 150 MG 12 hr tablet Take 150 mg by mouth 2 (two) times daily.  . chlorhexidine (PERIDEX) 0.12 % solution Use as directed 5 mLs in the mouth or throat 2 (two) times a week.   . Cholecalciferol (VITAMIN D3) 125 MCG (5000 UT) CAPS Take 1 capsule (5,000 Units total) by mouth daily. (Patient taking differently: Take 5,000  Units by mouth 2 (two) times a week. )  . clonazePAM (KLONOPIN) 0.5 MG tablet Take 0.5 mg by mouth at bedtime.   . Coenzyme Q10 (CO Q 10) 100 MG CAPS Take 100 mg by mouth daily.  . fenofibrate (TRICOR) 48 MG tablet TAKE 1 TABLET DAILY (Patient taking differently: Take 48 mg by mouth daily. )  . furosemide (LASIX) 40 MG tablet Take 40 mg by mouth daily.   Marland Kitchen GLIPIZIDE XL 5 MG 24 hr tablet TAKE 1 TABLET DAILY WITH BREAKFAST  . glucose blood (ACCU-CHEK GUIDE) test strip Use as instructed  . glucose blood test strip 1 each by Other route 2 (two) times daily. Use as instructed bid. E11.65  . Lancets (FREESTYLE) lancets Use as instructed to check blood glucose twice daily  . levothyroxine (SYNTHROID) 150 MCG tablet Take 1 tablet (150 mcg total) by mouth daily before breakfast.  . meloxicam (MOBIC) 15 MG tablet  Take 15 mg by mouth daily.  . metFORMIN (GLUCOPHAGE) 500 MG tablet Take 1 tablet (500 mg total) by mouth 2 (two) times daily with a meal.  . omeprazole (PRILOSEC) 20 MG capsule Take 20 mg by mouth daily.  . potassium chloride (K-DUR) 10 MEQ tablet Take 10 mEq by mouth daily.  . Probiotic Product (PROBIOTIC DAILY PO) Take 1 tablet by mouth daily. Woman's  . rosuvastatin (CRESTOR) 5 MG tablet Take 5 mg by mouth daily.  . [DISCONTINUED] metFORMIN (GLUCOPHAGE) 1000 MG tablet Take 1 tablet (1,000 mg total) by mouth 2 (two) times daily with a meal.  . [DISCONTINUED] SYNTHROID 150 MCG tablet TAKE 1 TABLET DAILY BEFORE BREAKFAST (Patient taking differently: Take 150 mcg by mouth daily before breakfast. )   No facility-administered encounter medications on file as of 12/07/2019.   ALLERGIES: Allergies  Allergen Reactions  . Flagyl [Metronidazole] Other (See Comments)    Shortness of breath   VACCINATION STATUS: Immunization History  Administered Date(s) Administered  . Moderna SARS-COVID-2 Vaccination 03/14/2019, 04/15/2019    Diabetes She presents for her follow-up diabetic visit. She has type 2 diabetes mellitus. Onset time: She was diagnosed at approximate age of 22 years. Her disease course has been worsening. There are no hypoglycemic associated symptoms. There are no diabetic associated symptoms. There are no hypoglycemic complications. Symptoms are worsening. There are no diabetic complications. Risk factors for coronary artery disease include diabetes mellitus, dyslipidemia, hypertension, obesity, sedentary lifestyle, post-menopausal and tobacco exposure. Current diabetic treatment includes oral agent (monotherapy). She is following a generally unhealthy diet. When asked about meal planning, she reported none. She has not had a previous visit with a dietitian. She rarely participates in exercise. Her home blood glucose trend is increasing steadily. (She did not bring any logs nor meter with  her.  Her point-of-care A1c was 7.4%, increasing from 6.9%.) An ACE inhibitor/angiotensin II receptor blocker is being taken. She does not see a podiatrist.Eye exam is not current.   Review of Systems    Review of systems  Constitutional: + Minimally fluctuating body weight,  current  Body mass index is 36.11 kg/m. , no fatigue, no subjective hyperthermia, no subjective hypothermia Eyes: no blurry vision, no xerophthalmia ENT: no sore throat, no nodules palpated in throat, no dysphagia/odynophagia, no hoarseness Cardiovascular: no Chest Pain, no Shortness of Breath, no palpitations, no leg swelling Respiratory: no cough, no shortness of breath Gastrointestinal: no Nausea/Vomiting/Diarhhea Musculoskeletal: no muscle/joint aches Skin: no rashes, no hyperemia Neurological: no tremors, no numbness, no tingling, no dizziness Psychiatric: no depression, no  anxiety   Objective:    BP (!) 162/86 (BP Location: Right Arm, Patient Position: Sitting, Cuff Size: Normal)   Pulse 87   Temp 97.7 F (36.5 C) (Temporal)   Resp 18   Ht 5' 7.5" (1.715 m)   SpO2 96%   BMI 36.11 kg/m   Wt Readings from Last 3 Encounters:  09/25/19 234 lb (106.1 kg)  09/23/19 234 lb (106.1 kg)  08/03/19 (!) 234 lb 12.8 oz (106.5 kg)    Physical Exam  Physical Exam- Limited  Constitutional:  Body mass index is 36.11 kg/m. , not in acute distress, normal state of mind Eyes:  EOMI, no exophthalmos Neck: Supple Thyroid: No gross goiter Respiratory: Adequate breathing efforts Musculoskeletal: no gross deformities, strength intact in all four extremities, no gross restriction of joint movements Skin:  no rashes, no hyperemia Neurological: no tremor with outstretched hands,    Recent Results (from the past 2160 hour(s))  SARS CORONAVIRUS 2 (TAT 6-24 HRS) Nasopharyngeal Nasopharyngeal Swab     Status: None   Collection Time: 09/23/19  2:50 PM   Specimen: Nasopharyngeal Swab  Result Value Ref Range   SARS  Coronavirus 2 NEGATIVE NEGATIVE    Comment: (NOTE) SARS-CoV-2 target nucleic acids are NOT DETECTED.  The SARS-CoV-2 RNA is generally detectable in upper and lower respiratory specimens during the acute phase of infection. Negative results do not preclude SARS-CoV-2 infection, do not rule out co-infections with other pathogens, and should not be used as the sole basis for treatment or other patient management decisions. Negative results must be combined with clinical observations, patient history, and epidemiological information. The expected result is Negative.  Fact Sheet for Patients: HairSlick.no  Fact Sheet for Healthcare Providers: quierodirigir.com  This test is not yet approved or cleared by the [place] FDA and  has been authorized for detection and/or diagnosis of SARS-CoV-2 by FDA under an Emergency Use Authorization (EUA). This EUA will remain  in effect (meaning this test can be used) for the duration of the COVID-19 declaration under Se ction 564(b)(1) of the Act, 21 U.S.C. section 360bbb-3(b)(1), unless the authorization is terminated or revoked sooner.  Performed at [facility], [address], [place], [place] [address]   Hemoglobin A1c     Status: Abnormal   Collection Time: 09/23/19  2:55 PM  Result Value Ref Range   Hgb A1c MFr Bld 6.9 (H) 4.8 - 5.6 %    Comment: (NOTE) Pre diabetes:          5.7%-6.4%  Diabetes:              >6.4%  Glycemic control for   <7.0% adults with diabetes    Mean Plasma Glucose 151.33 mg/dL    Comment: Performed at [facility], [address] [place], [place] [address]  Basic metabolic panel     Status: Abnormal   Collection Time: 09/23/19  2:55 PM  Result Value Ref Range   Sodium 137 135 - 145 mmol/L   Potassium 4.3 3.5 - 5.1 mmol/L   Chloride 101 98 - 111 mmol/L   CO2 28 22 - 32 mmol/L   Glucose, Bld 114 (H) 70 - 99 mg/dL    Comment: Glucose  reference range applies only to samples taken after fasting for at least 8 hours.   BUN 17 8 - 23 mg/dL   Creatinine, Ser 0.53 (H) 0.44 - 1.00 mg/dL   Calcium 9.9 8.9 - 97.6 mg/dL   GFR calc  non Af Amer 42 (L) >60 mL/min   GFR calc Af Amer 49 (L) >60 mL/min   Anion gap 8 5 - 15    Comment: Performed at [facility], [address] [place], [place] [address]  Glucose, capillary     Status: Abnormal   Collection Time: 09/25/19 10:48 AM  Result Value Ref Range   Glucose-Capillary 147 (H) 70 - 99 mg/dL    Comment: Glucose reference range applies only to samples taken after fasting for at least 8 hours.  SARS CORONAVIRUS 2 (TAT 6-24 HRS) Nasopharyngeal Nasopharyngeal Swab     Status: None   Collection Time: 10/07/19  2:00 PM   Specimen: Nasopharyngeal Swab  Result Value Ref Range   SARS Coronavirus 2 NEGATIVE NEGATIVE    Comment: (NOTE) SARS-CoV-2 target nucleic acids are NOT DETECTED.  The SARS-CoV-2 RNA is generally detectable in upper and lower respiratory specimens during the acute phase of infection. Negative results do not preclude SARS-CoV-2 infection, do not rule out co-infections with other pathogens, and should not be used as the sole basis for treatment or other patient management decisions. Negative results must be combined with clinical observations, patient history, and epidemiological information. The expected result is Negative.  Fact Sheet for Patients: HairSlick.no  Fact Sheet for Healthcare Providers: quierodirigir.com  This test is not yet approved or cleared by the [place] FDA and  has been authorized for detection and/or diagnosis of SARS-CoV-2 by FDA under an Emergency Use Authorization (EUA). This EUA will remain  in effect (meaning this test can be used) for the duration of the COVID-19 declaration under Se ction 564(b)(1) of the Act, 21 U.S.C. section 360bbb-3(b)(1), unless the authorization is  terminated or revoked sooner.  Performed at [facility], [address], [place], [place] [address]   Glucose, capillary     Status: Abnormal   Collection Time: 10/09/19  7:31 AM  Result Value Ref Range   Glucose-Capillary 137 (H) 70 - 99 mg/dL    Comment: Glucose reference range applies only to samples taken after fasting for at least 8 hours.  Lipid panel     Status: Abnormal   Collection Time: 11/30/19  1:58 PM  Result Value Ref Range   Cholesterol 208 (H) <200 mg/dL   HDL 44 (L) > OR = 50 mg/dL   Triglycerides 657 (H) <150 mg/dL    Comment: . If a non-fasting specimen was collected, consider repeat triglyceride testing on a fasting specimen if clinically indicated.  Perry Mount et al. J. of Clin. Lipidol. 2015;9:129-169. Marland Kitchen    LDL Cholesterol (Calc) 119 (H) mg/dL (calc)    Comment: Reference range: <100 . Desirable range <100 mg/dL for primary prevention;   <70 mg/dL for patients with CHD or diabetic patients  with > or = 2 CHD risk factors. Marland Kitchen LDL-C is now calculated using the Martin-Hopkins  calculation, which is a validated novel method providing  better accuracy than the Friedewald equation in the  estimation of LDL-C.  Horald Pollen et al. Lenox Ahr. 8469;629(52): 2061-2068  (http://education.[facility].com/faq/FAQ164)    Total CHOL/HDL Ratio 4.7 <5.0 (calc)   Non-HDL Cholesterol (Calc) 164 (H) <130 mg/dL (calc)    Comment: For patients with diabetes plus 1 major ASCVD risk  factor, treating to a non-HDL-C goal of <100 mg/dL  (LDL-C of <84 mg/dL) is considered a therapeutic  option.   COMPLETE METABOLIC PANEL WITH GFR     Status: Abnormal   Collection Time: 11/30/19  1:58 PM  Result  Value Ref Range   Glucose, Bld 149 (H) 65 - 99 mg/dL    Comment: .            Fasting reference interval . For someone without known diabetes, a glucose value >125 mg/dL indicates that they may have diabetes and this should be confirmed with a follow-up test. .    BUN 15 7  - 25 mg/dL   Creat 5.63 (H) 8.75 - 0.93 mg/dL    Comment: For patients >2 years of age, the reference limit for Creatinine is approximately 13% higher for people identified as African-American. .    GFR, Est Non African American 45 (L) > OR = 60 mL/min/1.31m2   GFR, Est African American 52 (L) > OR = 60 mL/min/1.32m2   BUN/Creatinine Ratio 12 6 - 22 (calc)   Sodium 140 135 - 146 mmol/L   Potassium 4.7 3.5 - 5.3 mmol/L   Chloride 104 98 - 110 mmol/L   CO2 27 20 - 32 mmol/L   Calcium 10.2 8.6 - 10.4 mg/dL   Total Protein 7.0 6.1 - 8.1 g/dL   Albumin 4.3 3.6 - 5.1 g/dL   Globulin 2.7 1.9 - 3.7 g/dL (calc)   AG Ratio 1.6 1.0 - 2.5 (calc)   Total Bilirubin 0.5 0.2 - 1.2 mg/dL   Alkaline phosphatase (APISO) 74 37 - 153 U/L   AST 35 10 - 35 U/L   ALT 31 (H) 6 - 29 U/L  TSH     Status: None   Collection Time: 11/30/19  1:58 PM  Result Value Ref Range   TSH 2.81 0.40 - 4.50 mIU/L  T4, free     Status: None   Collection Time: 11/30/19  1:58 PM  Result Value Ref Range   Free T4 1.5 0.8 - 1.8 ng/dL  POCT glycosylated hemoglobin (Hb A1C)     Status: Abnormal   Collection Time: 12/07/19  3:09 PM  Result Value Ref Range   Hemoglobin A1C 7.4 (A) 4.0 - 5.6 %   HbA1c POC (<> result, manual entry)     HbA1c, POC (prediabetic range)     HbA1c, POC (controlled diabetic range) 7.4 (A) 0.0 - 7.0 %     Assessment & Plan:   1. Hypothyroidism -Her thyroid function tests are consistent with appropriate replacement.  She is advised to continue levothyroxine 150 mcg p.o. daily before breakfast.    - We discussed about the correct intake of her thyroid hormone, on empty stomach at fasting, with water, separated by at least 30 minutes from breakfast and other medications,  and separated by more than 4 hours from calcium, iron, multivitamins, acid reflux medications (PPIs). -Patient is made aware of the fact that thyroid hormone replacement is needed for life, dose to be adjusted by periodic  monitoring of thyroid function tests.   2.  Type 2 diabetes-   She did not bring any logs nor meter with her.  Her point-of-care A1c was 7.4%, increasing from 6.9%.   She is advised to lower Metformin to 500 mg p.o. twice daily-after breakfast and after supper.   -She has benefited and responded to low-dose glipizide.  She is advised to continue Glipizide 5mg  XL at breakfast daily. - she  admits there is a room for improvement in her diet and drink choices. -  Suggestion is made for her to avoid simple carbohydrates  from her diet including Cakes, Sweet Desserts / Pastries, Ice Cream, Soda (diet and regular), Sweet Tea, Candies, Chips,  Cookies, Sweet Pastries,  Store Bought Juices, Alcohol in Excess of  1-2 drinks a day, Artificial Sweeteners, Coffee Creamer, and "Sugar-free" Products. This will help patient to have stable blood glucose profile and potentially avoid unintended weight gain.   3) hypertension:   -She is advised to be consistent in taking her blood pressure medications including benazepril 40 mg p.o. daily, metoprolol 50 mg p.o. twice daily and Lasix as needed.  4) hyperlipidemia: She does have uncontrolled triglyceridemia.  Her triglycerides are improving after she was started on fenofibrate.  She is advised to continue fenofibrate 48 mg p.o. daily along with her Crestor 20 mg p.o. daily.   Side effects and precautions discussed with her.  5) Multinodular goiter : -Her surveillance thyroid ultrasound is unremarkable with stable thyroid lobe size and unchanged  nodules from last exam and stable ultrasound findings in 2012 2019.   She will not need any intervention at this time.  She will be considered for surveillance repeat thyroid ultrasound after her next visit.  6) vitamin D deficiency  -She is currently on vitamin D supplement, D3 5000 units daily .  - I advised patient to maintain close follow up with Kirstie Peri, MD for primary care needs.     - Time spent on this  patient care encounter:  30 0 minutes of which 50% was spent in  counseling and the rest reviewing  her current and  previous labs / studies and medications  doses and developing a plan for long term care. Dana Lawson  participated in the discussions, expressed understanding, and voiced agreement with the above plans.  All questions were answered to her satisfaction. she is encouraged to contact clinic should she have any questions or concerns prior to her return visit.   Follow up plan: Return in about 6 months (around 06/05/2020) for Bring Meter and Logs- A1c in Office, ABI in Office NV, Urine MA - NV.  Marquis Lunch, MD Phone: [phone]  Fax: [phone]  This note was partially dictated with voice recognition software. Similar sounding words can be transcribed inadequately or may not  be corrected upon review.  12/07/2019, 3:24 PM

## 2019-12-07 NOTE — Progress Notes (Signed)
12/07/2019   Endocrinology follow-up note   Subjective:    Patient ID: Dana Lawson, female    DOB: 02-21-1945, PCP Kirstie Peri, MD   Past Medical History:  Diagnosis Date  . Anxiety   . COPD (chronic obstructive pulmonary disease) (HCC)   . Depression   . Essential hypertension   . GERD (gastroesophageal reflux disease)   . Hepatic steatosis   . Hyperlipidemia   . Hypothyroidism   . Type 2 diabetes mellitus (HCC)    Past Surgical History:  Procedure Laterality Date  . LEFT HEART CATH AND CORONARY ANGIOGRAPHY N/A 11/08/2016   Procedure: LEFT HEART CATH AND CORONARY ANGIOGRAPHY;  Surgeon: Marykay Lex, MD;  Location: [facility] INVASIVE CV LAB;  Service: Cardiovascular;  Laterality: N/A;  . TUBAL LIGATION     Social History   Socioeconomic History  . Marital status: Married    Spouse name: Not on file  . Number of children: Not on file  . Years of education: Not on file  . Highest education level: Not on file  Occupational History  . Not on file  Tobacco Use  . Smoking status: Former Smoker    Types: Cigarettes    Quit date: 11/06/2015    Years since quitting: 4.0  . Smokeless tobacco: Never Used  Vaping Use  . Vaping Use: Never used  Substance and Sexual Activity  . Alcohol use: Yes    Alcohol/week: 0.0 standard drinks    Comment: Occasional  . Drug use: No  . Sexual activity: Not on file  Other Topics Concern  . Not on file  Social History Narrative  . Not on file   Social Determinants of Health   Financial Resource Strain:   . Difficulty of Paying Living Expenses: Not on file  Food Insecurity:   . Worried About Programme researcher, broadcasting/film/video in the Last Year: Not on file  . Ran Out of Food in the Last Year: Not on file  Transportation Needs:   . Lack of Transportation (Medical): Not on file  . Lack of Transportation (Non-Medical): Not on file  Physical Activity:   . Days of Exercise per Week: Not on file  . Minutes of Exercise per Session: Not on file   Stress:   . Feeling of Stress : Not on file  Social Connections:   . Frequency of Communication with Friends and Family: Not on file  . Frequency of Social Gatherings with Friends and Family: Not on file  . Attends Religious Services: Not on file  . Active Member of Clubs or Organizations: Not on file  . Attends Banker Meetings: Not on file  . Marital Status: Not on file   Outpatient Encounter Medications as of 12/07/2019  Medication Sig  . aspirin 81 MG tablet Take 81 mg by mouth 3 (three) times a week.   . benazepril (LOTENSIN) 40 MG tablet Take 40 mg by mouth daily.  . Blood Glucose Monitoring Suppl (FREESTYLE LITE) DEVI 1 each by Does not apply route 2 (two) times daily. Use as instructed to check blood glucose two times a day.  Marland Kitchen buPROPion (WELLBUTRIN SR) 150 MG 12 hr tablet Take 150 mg by mouth 2 (two) times daily.  . chlorhexidine (PERIDEX) 0.12 % solution Use as directed 5 mLs in the mouth or throat 2 (two) times a week.   . Cholecalciferol (VITAMIN D3) 125 MCG (5000 UT) CAPS Take 1 capsule (5,000 Units total) by mouth daily. (Patient taking differently: Take 5,000  Units by mouth 2 (two) times a week. )  . clonazePAM (KLONOPIN) 0.5 MG tablet Take 0.5 mg by mouth at bedtime.   . Coenzyme Q10 (CO Q 10) 100 MG CAPS Take 100 mg by mouth daily.  . fenofibrate (TRICOR) 48 MG tablet TAKE 1 TABLET DAILY (Patient taking differently: Take 48 mg by mouth daily. )  . furosemide (LASIX) 40 MG tablet Take 40 mg by mouth daily.   Marland Kitchen GLIPIZIDE XL 5 MG 24 hr tablet TAKE 1 TABLET DAILY WITH BREAKFAST  . glucose blood (ACCU-CHEK GUIDE) test strip Use as instructed  . glucose blood test strip 1 each by Other route 2 (two) times daily. Use as instructed bid. E11.65  . Lancets (FREESTYLE) lancets Use as instructed to check blood glucose twice daily  . meloxicam (MOBIC) 15 MG tablet Take 15 mg by mouth daily.  . metFORMIN (GLUCOPHAGE) 1000 MG tablet Take 1 tablet (1,000 mg total) by mouth  2 (two) times daily with a meal.  . omeprazole (PRILOSEC) 20 MG capsule Take 20 mg by mouth daily.  . potassium chloride (K-DUR) 10 MEQ tablet Take 10 mEq by mouth daily.  . Probiotic Product (PROBIOTIC DAILY PO) Take 1 tablet by mouth daily. Woman's  . rosuvastatin (CRESTOR) 5 MG tablet Take 5 mg by mouth daily.  Marland Kitchen SYNTHROID 150 MCG tablet TAKE 1 TABLET DAILY BEFORE BREAKFAST (Patient taking differently: Take 150 mcg by mouth daily before breakfast. )   No facility-administered encounter medications on file as of 12/07/2019.   ALLERGIES: Allergies  Allergen Reactions  . Flagyl [Metronidazole] Other (See Comments)    Shortness of breath   VACCINATION STATUS: Immunization History  Administered Date(s) Administered  . Moderna SARS-COVID-2 Vaccination 03/14/2019, 04/15/2019    Diabetes She presents for her follow-up diabetic visit. She has type 2 diabetes mellitus. Onset time: She was diagnosed at approximate age of 20 years. Her disease course has been improving. There are no hypoglycemic associated symptoms. There are no diabetic associated symptoms. There are no hypoglycemic complications. Symptoms are improving. There are no diabetic complications. Risk factors for coronary artery disease include diabetes mellitus, dyslipidemia, hypertension, obesity, sedentary lifestyle, post-menopausal and tobacco exposure. Current diabetic treatment includes oral agent (monotherapy). She is following a generally unhealthy diet. When asked about meal planning, she reported none. She has not had a previous visit with a dietitian. She rarely participates in exercise. Her home blood glucose trend is decreasing steadily. An ACE inhibitor/angiotensin II receptor blocker is being taken. She does not see a podiatrist.Eye exam is not current.   Review of Systems    Review of systems  Constitutional: + Minimally fluctuating body weight,  current  Body mass index is 36.11 kg/m. , no fatigue, no subjective  hyperthermia, no subjective hypothermia Eyes: no blurry vision, no xerophthalmia ENT: no sore throat, no nodules palpated in throat, no dysphagia/odynophagia, no hoarseness Cardiovascular: no Chest Pain, no Shortness of Breath, no palpitations, no leg swelling Respiratory: no cough, no shortness of breath Gastrointestinal: no Nausea/Vomiting/Diarhhea Musculoskeletal: no muscle/joint aches Skin: no rashes, no hyperemia Neurological: no tremors, no numbness, no tingling, no dizziness Psychiatric: no depression, no anxiety   Objective:    BP (!) 162/86 (BP Location: Right Arm, Patient Position: Sitting, Cuff Size: Normal)   Pulse 87   Temp 97.7 F (36.5 C) (Temporal)   Resp 18   Ht 5' 7.5" (1.715 m)   SpO2 96%   BMI 36.11 kg/m   Wt Readings from Last 3 Encounters:  09/25/19 234 lb (106.1 kg)  09/23/19 234 lb (106.1 kg)  08/03/19 (!) 234 lb 12.8 oz (106.5 kg)    Physical Exam  Physical Exam- Limited  Constitutional:  Body mass index is 36.11 kg/m. , not in acute distress, normal state of mind Eyes:  EOMI, no exophthalmos Neck: Supple Thyroid: No gross goiter Respiratory: Adequate breathing efforts Musculoskeletal: no gross deformities, strength intact in all four extremities, no gross restriction of joint movements Skin:  no rashes, no hyperemia Neurological: no tremor with outstretched hands,    Recent Results (from the past 2160 hour(s))  SARS CORONAVIRUS 2 (TAT 6-24 HRS) Nasopharyngeal Nasopharyngeal Swab     Status: None   Collection Time: 09/23/19  2:50 PM   Specimen: Nasopharyngeal Swab  Result Value Ref Range   SARS Coronavirus 2 NEGATIVE NEGATIVE    Comment: (NOTE) SARS-CoV-2 target nucleic acids are NOT DETECTED.  The SARS-CoV-2 RNA is generally detectable in upper and lower respiratory specimens during the acute phase of infection. Negative results do not preclude SARS-CoV-2 infection, do not rule out co-infections with other pathogens, and should not be  used as the sole basis for treatment or other patient management decisions. Negative results must be combined with clinical observations, patient history, and epidemiological information. The expected result is Negative.  Fact Sheet for Patients: HairSlick.no  Fact Sheet for Healthcare Providers: quierodirigir.com  This test is not yet approved or cleared by the [place] FDA and  has been authorized for detection and/or diagnosis of SARS-CoV-2 by FDA under an Emergency Use Authorization (EUA). This EUA will remain  in effect (meaning this test can be used) for the duration of the COVID-19 declaration under Se ction 564(b)(1) of the Act, 21 U.S.C. section 360bbb-3(b)(1), unless the authorization is terminated or revoked sooner.  Performed at [facility], [address], [place], [place] [address]   Hemoglobin A1c     Status: Abnormal   Collection Time: 09/23/19  2:55 PM  Result Value Ref Range   Hgb A1c MFr Bld 6.9 (H) 4.8 - 5.6 %    Comment: (NOTE) Pre diabetes:          5.7%-6.4%  Diabetes:              >6.4%  Glycemic control for   <7.0% adults with diabetes    Mean Plasma Glucose 151.33 mg/dL    Comment: Performed at [facility], [address], [place], [place] [address]  Basic metabolic panel     Status: Abnormal   Collection Time: 09/23/19  2:55 PM  Result Value Ref Range   Sodium 137 135 - 145 mmol/L   Potassium 4.3 3.5 - 5.1 mmol/L   Chloride 101 98 - 111 mmol/L   CO2 28 22 - 32 mmol/L   Glucose, Bld 114 (H) 70 - 99 mg/dL    Comment: Glucose reference range applies only to samples taken after fasting for at least 8 hours.   BUN 17 8 - 23 mg/dL   Creatinine, Ser 3.24 (H) 0.44 - 1.00 mg/dL   Calcium 9.9 8.9 - 40.1 mg/dL   GFR calc non Af Amer 42 (L) >60 mL/min   GFR calc Af Amer 49 (L) >60 mL/min   Anion gap 8 5 - 15    Comment: Performed at [facility], [address] [place],  [place] [address]  Glucose, capillary     Status: Abnormal   Collection Time: 09/25/19 10:48 AM  Result Value Ref Range  Glucose-Capillary 147 (H) 70 - 99 mg/dL    Comment: Glucose reference range applies only to samples taken after fasting for at least 8 hours.  SARS CORONAVIRUS 2 (TAT 6-24 HRS) Nasopharyngeal Nasopharyngeal Swab     Status: None   Collection Time: 10/07/19  2:00 PM   Specimen: Nasopharyngeal Swab  Result Value Ref Range   SARS Coronavirus 2 NEGATIVE NEGATIVE    Comment: (NOTE) SARS-CoV-2 target nucleic acids are NOT DETECTED.  The SARS-CoV-2 RNA is generally detectable in upper and lower respiratory specimens during the acute phase of infection. Negative results do not preclude SARS-CoV-2 infection, do not rule out co-infections with other pathogens, and should not be used as the sole basis for treatment or other patient management decisions. Negative results must be combined with clinical observations, patient history, and epidemiological information. The expected result is Negative.  Fact Sheet for Patients: HairSlick.no  Fact Sheet for Healthcare Providers: quierodirigir.com  This test is not yet approved or cleared by the [place] FDA and  has been authorized for detection and/or diagnosis of SARS-CoV-2 by FDA under an Emergency Use Authorization (EUA). This EUA will remain  in effect (meaning this test can be used) for the duration of the COVID-19 declaration under Se ction 564(b)(1) of the Act, 21 U.S.C. section 360bbb-3(b)(1), unless the authorization is terminated or revoked sooner.  Performed at [facility], [address] [place], [place] [address]   Glucose, capillary     Status: Abnormal   Collection Time: 10/09/19  7:31 AM  Result Value Ref Range   Glucose-Capillary 137 (H) 70 - 99 mg/dL    Comment: Glucose reference range applies only to samples taken after fasting for at least 8  hours.  Lipid panel     Status: Abnormal   Collection Time: 11/30/19  1:58 PM  Result Value Ref Range   Cholesterol 208 (H) <200 mg/dL   HDL 44 (L) > OR = 50 mg/dL   Triglycerides 604 (H) <150 mg/dL    Comment: . If a non-fasting specimen was collected, consider repeat triglyceride testing on a fasting specimen if clinically indicated.  Perry Mount et al. J. of Clin. Lipidol. 2015;9:129-169. Marland Kitchen    LDL Cholesterol (Calc) 119 (H) mg/dL (calc)    Comment: Reference range: <100 . Desirable range <100 mg/dL for primary prevention;   <70 mg/dL for patients with CHD or diabetic patients  with > or = 2 CHD risk factors. Marland Kitchen LDL-C is now calculated using the Martin-Hopkins  calculation, which is a validated novel method providing  better accuracy than the Friedewald equation in the  estimation of LDL-C.  Horald Pollen et al. Lenox Ahr. 5409;811(91): 2061-2068  (http://education.[facility].com/faq/FAQ164)    Total CHOL/HDL Ratio 4.7 <5.0 (calc)   Non-HDL Cholesterol (Calc) 164 (H) <130 mg/dL (calc)    Comment: For patients with diabetes plus 1 major ASCVD risk  factor, treating to a non-HDL-C goal of <100 mg/dL  (LDL-C of <47 mg/dL) is considered a therapeutic  option.   COMPLETE METABOLIC PANEL WITH GFR     Status: Abnormal   Collection Time: 11/30/19  1:58 PM  Result Value Ref Range   Glucose, Bld 149 (H) 65 - 99 mg/dL    Comment: .            Fasting reference interval . For someone without known diabetes, a glucose value >125 mg/dL indicates that they may have diabetes and this should be confirmed with a follow-up test. .    BUN  15 7 - 25 mg/dL   Creat 8.84 (H) 1.66 - 0.93 mg/dL    Comment: For patients >7 years of age, the reference limit for Creatinine is approximately 13% higher for people identified as African-American. .    GFR, Est Non African American 45 (L) > OR = 60 mL/min/1.29m2   GFR, Est African American 52 (L) > OR = 60 mL/min/1.94m2   BUN/Creatinine Ratio 12 6  - 22 (calc)   Sodium 140 135 - 146 mmol/L   Potassium 4.7 3.5 - 5.3 mmol/L   Chloride 104 98 - 110 mmol/L   CO2 27 20 - 32 mmol/L   Calcium 10.2 8.6 - 10.4 mg/dL   Total Protein 7.0 6.1 - 8.1 g/dL   Albumin 4.3 3.6 - 5.1 g/dL   Globulin 2.7 1.9 - 3.7 g/dL (calc)   AG Ratio 1.6 1.0 - 2.5 (calc)   Total Bilirubin 0.5 0.2 - 1.2 mg/dL   Alkaline phosphatase (APISO) 74 37 - 153 U/L   AST 35 10 - 35 U/L   ALT 31 (H) 6 - 29 U/L  TSH     Status: None   Collection Time: 11/30/19  1:58 PM  Result Value Ref Range   TSH 2.81 0.40 - 4.50 mIU/L  T4, free     Status: None   Collection Time: 11/30/19  1:58 PM  Result Value Ref Range   Free T4 1.5 0.8 - 1.8 ng/dL  POCT glycosylated hemoglobin (Hb A1C)     Status: Abnormal   Collection Time: 12/07/19  3:09 PM  Result Value Ref Range   Hemoglobin A1C 7.4 (A) 4.0 - 5.6 %   HbA1c POC (<> result, manual entry)     HbA1c, POC (prediabetic range)     HbA1c, POC (controlled diabetic range) 7.4 (A) 0.0 - 7.0 %     Assessment & Plan:   1. Hypothyroidism -Her thyroid function tests are consistent with appropriate replacement.  She is advised to continue levothyroxine 150 mcg p.o. daily before breakfast.    - We discussed about the correct intake of her thyroid hormone, on empty stomach at fasting, with water, separated by at least 30 minutes from breakfast and other medications,  and separated by more than 4 hours from calcium, iron, multivitamins, acid reflux medications (PPIs). -Patient is made aware of the fact that thyroid hormone replacement is needed for life, dose to be adjusted by periodic monitoring of thyroid function tests.   2.  Type 2 diabetes-   -Her point-of-care A1c is 6.9% improving from 8.3%.   She is advised to continue  Metformin to 1000 mg daily after supper and 1000 mg daily after breakfast.   She will need close follow-up of her renal function. -She has benefited and responded to low-dose glipizide.  She is advised to continue  Glipizide 5mg  XL at breakfast daily.  - she  admits there is a room for improvement in her diet and drink choices. -  Suggestion is made for her to avoid simple carbohydrates  from her diet including Cakes, Sweet Desserts / Pastries, Ice Cream, Soda (diet and regular), Sweet Tea, Candies, Chips, Cookies, Sweet Pastries,  Store Bought Juices, Alcohol in Excess of  1-2 drinks a day, Artificial Sweeteners, Coffee Creamer, and "Sugar-free" Products. This will help patient to have stable blood glucose profile and potentially avoid unintended weight gain.  3) hypertension:   -She is advised to be consistent in taking her blood pressure medications including benazepril 40 mg p.o.  daily, metoprolol 50 mg p.o. twice daily and Lasix as needed.  4) hyperlipidemia: She does have uncontrolled triglyceridemia.  She is advised to continue Crestor 20 mg p.o. nightly, added fenofibrate  48 mg po qhs.  Side effects and precautions discussed with her.  5) Multinodular goiter : -Her surveillance thyroid ultrasound is unremarkable with stable thyroid lobe size and unchanged  nodules from last exam and stable ultrasound findings in 2012 2019.   She will not need any intervention at this time.  She will be considered for surveillance repeat thyroid ultrasound after her next visit.  6) vitamin D deficiency  -She is currently on vitamin D supplement, D3 5000 units daily .  - I advised patient to maintain close follow up with Kirstie Peri, MD for primary care needs.     - Time spent on this patient care encounter:  35 minutes of which 50% was spent in  counseling and the rest reviewing  her current and  previous labs / studies and medications  doses and developing a plan for long term care. Dana Lawson  participated in the discussions, expressed understanding, and voiced agreement with the above plans.  All questions were answered to her satisfaction. she is encouraged to contact clinic should she have any questions  or concerns prior to her return visit.   Follow up plan: Return in about 4 months (around 04/05/2020) for F/U with Meter and Logs Only - no Labs.  Marquis Lunch, MD Phone: [phone]  Fax: [phone]  This note was partially dictated with voice recognition software. Similar sounding words can be transcribed inadequately or may not  be corrected upon review.  12/07/2019, 3:20 PM

## 2019-12-08 DIAGNOSIS — E1143 Type 2 diabetes mellitus with diabetic autonomic (poly)neuropathy: Secondary | ICD-10-CM | POA: Diagnosis not present

## 2019-12-08 DIAGNOSIS — M79605 Pain in left leg: Secondary | ICD-10-CM | POA: Diagnosis not present

## 2019-12-08 DIAGNOSIS — M79604 Pain in right leg: Secondary | ICD-10-CM | POA: Diagnosis not present

## 2020-01-15 DIAGNOSIS — J449 Chronic obstructive pulmonary disease, unspecified: Secondary | ICD-10-CM | POA: Diagnosis not present

## 2020-01-15 DIAGNOSIS — E1165 Type 2 diabetes mellitus with hyperglycemia: Secondary | ICD-10-CM | POA: Diagnosis not present

## 2020-01-15 DIAGNOSIS — N183 Chronic kidney disease, stage 3 unspecified: Secondary | ICD-10-CM | POA: Diagnosis not present

## 2020-01-15 DIAGNOSIS — F17210 Nicotine dependence, cigarettes, uncomplicated: Secondary | ICD-10-CM | POA: Diagnosis not present

## 2020-01-15 DIAGNOSIS — Z299 Encounter for prophylactic measures, unspecified: Secondary | ICD-10-CM | POA: Diagnosis not present

## 2020-01-15 DIAGNOSIS — M5431 Sciatica, right side: Secondary | ICD-10-CM | POA: Diagnosis not present

## 2020-01-15 DIAGNOSIS — Z6837 Body mass index (BMI) 37.0-37.9, adult: Secondary | ICD-10-CM | POA: Diagnosis not present

## 2020-02-04 DIAGNOSIS — Z20828 Contact with and (suspected) exposure to other viral communicable diseases: Secondary | ICD-10-CM | POA: Diagnosis not present

## 2020-05-18 DIAGNOSIS — I10 Essential (primary) hypertension: Secondary | ICD-10-CM | POA: Diagnosis not present

## 2020-05-18 DIAGNOSIS — Z87891 Personal history of nicotine dependence: Secondary | ICD-10-CM | POA: Diagnosis not present

## 2020-05-18 DIAGNOSIS — Z299 Encounter for prophylactic measures, unspecified: Secondary | ICD-10-CM | POA: Diagnosis not present

## 2020-05-18 DIAGNOSIS — M109 Gout, unspecified: Secondary | ICD-10-CM | POA: Diagnosis not present

## 2020-05-18 DIAGNOSIS — E1165 Type 2 diabetes mellitus with hyperglycemia: Secondary | ICD-10-CM | POA: Diagnosis not present

## 2020-05-18 DIAGNOSIS — N183 Chronic kidney disease, stage 3 unspecified: Secondary | ICD-10-CM | POA: Diagnosis not present

## 2020-06-06 DIAGNOSIS — I10 Essential (primary) hypertension: Secondary | ICD-10-CM | POA: Diagnosis not present

## 2020-06-07 ENCOUNTER — Other Ambulatory Visit: Payer: Self-pay | Admitting: "Endocrinology

## 2020-06-07 ENCOUNTER — Ambulatory Visit (INDEPENDENT_AMBULATORY_CARE_PROVIDER_SITE_OTHER): Payer: Medicare Other | Admitting: "Endocrinology

## 2020-06-07 ENCOUNTER — Encounter: Payer: Self-pay | Admitting: "Endocrinology

## 2020-06-07 VITALS — BP 202/84 | HR 85 | Ht 67.5 in | Wt 233.8 lb

## 2020-06-07 DIAGNOSIS — E782 Mixed hyperlipidemia: Secondary | ICD-10-CM | POA: Diagnosis not present

## 2020-06-07 DIAGNOSIS — E038 Other specified hypothyroidism: Secondary | ICD-10-CM

## 2020-06-07 DIAGNOSIS — I10 Essential (primary) hypertension: Secondary | ICD-10-CM | POA: Diagnosis not present

## 2020-06-07 DIAGNOSIS — E118 Type 2 diabetes mellitus with unspecified complications: Secondary | ICD-10-CM | POA: Diagnosis not present

## 2020-06-07 LAB — POCT GLYCOSYLATED HEMOGLOBIN (HGB A1C): HbA1c, POC (controlled diabetic range): 7.8 % — AB (ref 0.0–7.0)

## 2020-06-07 LAB — POCT UA - MICROALBUMIN
Albumin/Creatinine Ratio, Urine, POC: <30
Creatinine, POC: 100 mg/dL
Microalbumin Ur, POC: 10 mg/L

## 2020-06-07 MED ORDER — HYDROCHLOROTHIAZIDE 25 MG PO TABS: 25.0000 mg | ORAL_TABLET | Freq: Every day | ORAL | 0 refills | Status: DC

## 2020-06-07 NOTE — Progress Notes (Signed)
06/07/2020   Endocrinology follow-up note   Subjective:    Patient ID: Dana Lawson, female    DOB: 24-Dec-1945, PCP Kirstie Peri, MD   Past Medical History:  Diagnosis Date  . Anxiety   . COPD (chronic obstructive pulmonary disease) (HCC)   . Depression   . Essential hypertension   . GERD (gastroesophageal reflux disease)   . Hepatic steatosis   . Hyperlipidemia   . Hypothyroidism   . Type 2 diabetes mellitus (HCC)    Past Surgical History:  Procedure Laterality Date  . LEFT HEART CATH AND CORONARY ANGIOGRAPHY N/A 11/08/2016   Procedure: LEFT HEART CATH AND CORONARY ANGIOGRAPHY;  Surgeon: Marykay Lex, MD;  Location: [facility] INVASIVE CV LAB;  Service: Cardiovascular;  Laterality: N/A;  . TUBAL LIGATION     Social History   Socioeconomic History  . Marital status: Married    Spouse name: Not on file  . Number of children: Not on file  . Years of education: Not on file  . Highest education level: Not on file  Occupational History  . Not on file  Tobacco Use  . Smoking status: Former Smoker    Types: Cigarettes    Quit date: 11/06/2015    Years since quitting: 4.5  . Smokeless tobacco: Never Used  Vaping Use  . Vaping Use: Never used  Substance and Sexual Activity  . Alcohol use: Yes    Alcohol/week: 0.0 standard drinks    Comment: Occasional  . Drug use: No  . Sexual activity: Not on file  Other Topics Concern  . Not on file  Social History Narrative  . Not on file   Social Determinants of Health   Financial Resource Strain: Not on file  Food Insecurity: Not on file  Transportation Needs: Not on file  Physical Activity: Not on file  Stress: Not on file  Social Connections: Not on file   Outpatient Encounter Medications as of 06/07/2020  Medication Sig  . hydrochlorothiazide (HYDRODIURIL) 25 MG tablet Take 1 tablet (25 mg total) by mouth daily.  Marland Kitchen aspirin 81 MG tablet Take 81 mg by mouth 3 (three) times a week.   . benazepril (LOTENSIN) 40 MG tablet  Take 40 mg by mouth daily.  . Blood Glucose Monitoring Suppl (FREESTYLE LITE) DEVI 1 each by Does not apply route 2 (two) times daily. Use as instructed to check blood glucose two times a day.  Marland Kitchen buPROPion (WELLBUTRIN SR) 150 MG 12 hr tablet Take 150 mg by mouth 2 (two) times daily.  . chlorhexidine (PERIDEX) 0.12 % solution Use as directed 5 mLs in the mouth or throat 2 (two) times a week.   . Cholecalciferol (VITAMIN D3) 125 MCG (5000 UT) CAPS Take 1 capsule (5,000 Units total) by mouth daily. (Patient taking differently: Take 5,000 Units by mouth 2 (two) times a week. )  . clonazePAM (KLONOPIN) 0.5 MG tablet Take 0.5 mg by mouth at bedtime.   . Coenzyme Q10 (CO Q 10) 100 MG CAPS Take 100 mg by mouth daily.  . fenofibrate (TRICOR) 48 MG tablet TAKE 1 TABLET DAILY (Patient taking differently: Take 48 mg by mouth daily. )  . furosemide (LASIX) 40 MG tablet Take 40 mg by mouth daily.   Marland Kitchen GLIPIZIDE XL 5 MG 24 hr tablet TAKE 1 TABLET DAILY WITH BREAKFAST  . glucose blood (ACCU-CHEK GUIDE) test strip Use as instructed  . glucose blood test strip 1 each by Other route 2 (two) times daily. Use as  instructed bid. E11.65  . Lancets (FREESTYLE) lancets Use as instructed to check blood glucose twice daily  . levothyroxine (SYNTHROID) 150 MCG tablet Take 1 tablet (150 mcg total) by mouth daily before breakfast.  . meloxicam (MOBIC) 15 MG tablet Take 15 mg by mouth daily.  . metFORMIN (GLUCOPHAGE) 500 MG tablet Take 1 tablet (500 mg total) by mouth 2 (two) times daily with a meal.  . omeprazole (PRILOSEC) 20 MG capsule Take 20 mg by mouth daily.  . potassium chloride (K-DUR) 10 MEQ tablet Take 10 mEq by mouth daily.  . Probiotic Product (PROBIOTIC DAILY PO) Take 1 tablet by mouth daily. Woman's  . rosuvastatin (CRESTOR) 5 MG tablet Take 5 mg by mouth daily.   No facility-administered encounter medications on file as of 06/07/2020.   ALLERGIES: Allergies  Allergen Reactions  . Flagyl [Metronidazole]  Other (See Comments)    Shortness of breath   VACCINATION STATUS: Immunization History  Administered Date(s) Administered  . Moderna Sars-Covid-2 Vaccination 03/14/2019, 04/15/2019    Diabetes She presents for her follow-up diabetic visit. She has type 2 diabetes mellitus. Onset time: She was diagnosed at approximate age of 33 years. Her disease course has been worsening. There are no hypoglycemic associated symptoms. There are no diabetic associated symptoms. There are no hypoglycemic complications. Symptoms are worsening. There are no diabetic complications. Risk factors for coronary artery disease include diabetes mellitus, dyslipidemia, hypertension, obesity, sedentary lifestyle, post-menopausal and tobacco exposure. Current diabetic treatment includes oral agent (monotherapy). She is following a generally unhealthy diet. When asked about meal planning, she reported none. She has not had a previous visit with a dietitian. She rarely participates in exercise. Her home blood glucose trend is increasing steadily. (She did not bring her logs nor meter.  Her A1c at point-of-care is 7.8%, progressively increasing from 6.9%.  She denies hypoglycemia. ) An ACE inhibitor/angiotensin II receptor blocker is being taken. She does not see a podiatrist.Eye exam is not current.   Review of Systems    Review of systems  Constitutional: + Minimally fluctuating body weight,  current  Body mass index is 36.08 kg/m. , no fatigue, no subjective hyperthermia, no subjective hypothermia    Objective:    BP (!) 202/84   Pulse 85   Ht 5' 7.5" (1.715 m)   Wt 233 lb 12.8 oz (106.1 kg)   BMI 36.08 kg/m   Wt Readings from Last 3 Encounters:  06/07/20 233 lb 12.8 oz (106.1 kg)  09/25/19 234 lb (106.1 kg)  09/23/19 234 lb (106.1 kg)    Physical Exam  Physical Exam- Limited  Constitutional:  Body mass index is 36.08 kg/m. , not in acute distress, normal state of mind    Recent Results (from the past  2160 hour(s))  HgB A1c     Status: Abnormal   Collection Time: 06/07/20  2:45 PM  Result Value Ref Range   Hemoglobin A1C     HbA1c POC (<> result, manual entry)     HbA1c, POC (prediabetic range)     HbA1c, POC (controlled diabetic range) 7.8 (A) 0.0 - 7.0 %  POCT UA - Microalbumin     Status: None   Collection Time: 06/07/20  2:58 PM  Result Value Ref Range   Microalbumin Ur, POC 10 mg/L   Creatinine, POC 100 mg/dL   Albumin/Creatinine Ratio, Urine, POC <30      Assessment & Plan:   1. Hypothyroidism -Her thyroid function tests are consistent with appropriate replacement.  She is advised to continue levothyroxine 150 mcg p.o. daily before breakfast.     - We discussed about the correct intake of her thyroid hormone, on empty stomach at fasting, with water, separated by at least 30 minutes from breakfast and other medications,  and separated by more than 4 hours from calcium, iron, multivitamins, acid reflux medications (PPIs). -Patient is made aware of the fact that thyroid hormone replacement is needed for life, dose to be adjusted by periodic monitoring of thyroid function tests.   2.  Type 2 diabetes-   She did not bring her logs nor meter.  Her A1c at point-of-care is 7.8%, progressively increasing from 6.9%.  She denies hypoglycemia.  -She is counseled and given lifestyle nutrition choices-whole food, plant-based versus processed meats and sweetened beverages.   She is advised to continue metformin 500 mg p.o. twice daily-daily after breakfast and after supper.  She has benefited and responded to low-dose glipizide , advised to continue glipizide 5 mg XL p.o. daily at breakfast.   - she  admits there is a room for improvement in her diet and drink choices. - she acknowledges that there is a room for improvement in her food and drink choices. - Suggestion is made for her to avoid simple carbohydrates  from her diet including Cakes, Sweet Desserts, Ice Cream, Soda (diet and  regular), Sweet Tea, Candies, Chips, Cookies, Store Bought Juices, Alcohol in Excess of  1-2 drinks a day, Artificial Sweeteners,  Coffee Creamer, and "Sugar-free" Products, Lemonade. This will help patient to have more stable blood glucose profile and potentially avoid unintended weight gain.   3) hypertension:  -Her blood pressure is not controlled.  Her urine microalbumin is normal today.  She is currently on benazepril 40 mg/day, will benefit from adding a diuretic.  I discussed and added HCTZ 25 mg p.o. daily at breakfast.  Until her last visit, she was also on metoprolol 50 mg p.o. twice a day, this medication is not on her list any longer.  She is advised to follow with her PMD for blood pressure medications adjustment.   4) hyperlipidemia: She does have uncontrolled triglyceridemia.  Her triglycerides are improving after she was started on fenofibrate.  She is advised to continue fenofibrate 48 mg p.o. daily along with her Crestor 20 mg p.o. daily at bedtime.    Side effects and precautions discussed with her.  5) Multinodular goiter : -She is documented to have multinodular goiter.  She did not have recent thyroid ultrasound, will have previsit surveillance thyroid ultrasound before her next visit.    6) vitamin D deficiency  -She is currently on vitamin D supplement, D3 5000 units daily .   POCT ABI Results 06/07/20   Right ABI: 1.14      left ABI: 1.15  Right leg systolic / diastolic: 230/94 mmHg Left leg systolic / diastolic: 232/97 mmHg  Arm systolic / diastolic: 202/84 mmHG -This study will be repeated in May 2027, or sooner if needed.  - I advised patient to maintain close follow up with Kirstie Peri, MD for primary care needs.    I spent 31 minutes in the care of the patient today including review of labs from Thyroid Function, CMP, and other relevant labs ; imaging/biopsy records (current and previous including abstractions from other facilities); face-to-face time  discussing  her lab results and symptoms, medications doses, her options of short and long term treatment based on the latest standards of care / guidelines;  and documenting the encounter.  Dana Lawson  participated in the discussions, expressed understanding, and voiced agreement with the above plans.  All questions were answered to her satisfaction. she is encouraged to contact clinic should she have any questions or concerns prior to her return visit.   Follow up plan: Return in about 3 months (around 09/07/2020) for F/U with Pre-visit Labs, Thyroid / Neck Ultrasound.  Marquis Lunch, MD Phone: [phone]  Fax: [phone]  This note was partially dictated with voice recognition software. Similar sounding words can be transcribed inadequately or may not  be corrected upon review.  06/07/2020, 5:51 PM

## 2020-06-07 NOTE — Patient Instructions (Signed)

## 2020-07-07 DIAGNOSIS — I10 Essential (primary) hypertension: Secondary | ICD-10-CM | POA: Diagnosis not present

## 2020-07-18 DIAGNOSIS — M109 Gout, unspecified: Secondary | ICD-10-CM | POA: Diagnosis not present

## 2020-07-18 DIAGNOSIS — M545 Low back pain, unspecified: Secondary | ICD-10-CM | POA: Diagnosis not present

## 2020-07-18 DIAGNOSIS — M25551 Pain in right hip: Secondary | ICD-10-CM | POA: Diagnosis not present

## 2020-07-18 DIAGNOSIS — E1165 Type 2 diabetes mellitus with hyperglycemia: Secondary | ICD-10-CM | POA: Diagnosis not present

## 2020-07-18 DIAGNOSIS — Z299 Encounter for prophylactic measures, unspecified: Secondary | ICD-10-CM | POA: Diagnosis not present

## 2020-07-18 DIAGNOSIS — R030 Elevated blood-pressure reading, without diagnosis of hypertension: Secondary | ICD-10-CM | POA: Diagnosis not present

## 2020-07-18 DIAGNOSIS — I10 Essential (primary) hypertension: Secondary | ICD-10-CM | POA: Diagnosis not present

## 2020-07-18 DIAGNOSIS — M549 Dorsalgia, unspecified: Secondary | ICD-10-CM | POA: Diagnosis not present

## 2020-08-02 ENCOUNTER — Other Ambulatory Visit: Payer: Self-pay | Admitting: "Endocrinology

## 2020-08-05 DIAGNOSIS — I10 Essential (primary) hypertension: Secondary | ICD-10-CM | POA: Diagnosis not present

## 2020-08-07 DIAGNOSIS — I10 Essential (primary) hypertension: Secondary | ICD-10-CM | POA: Diagnosis not present

## 2020-08-19 ENCOUNTER — Other Ambulatory Visit: Payer: Self-pay | Admitting: "Endocrinology

## 2020-08-26 DIAGNOSIS — E1165 Type 2 diabetes mellitus with hyperglycemia: Secondary | ICD-10-CM | POA: Diagnosis not present

## 2020-08-26 DIAGNOSIS — E1122 Type 2 diabetes mellitus with diabetic chronic kidney disease: Secondary | ICD-10-CM | POA: Diagnosis not present

## 2020-08-26 DIAGNOSIS — Z299 Encounter for prophylactic measures, unspecified: Secondary | ICD-10-CM | POA: Diagnosis not present

## 2020-08-26 DIAGNOSIS — I10 Essential (primary) hypertension: Secondary | ICD-10-CM | POA: Diagnosis not present

## 2020-08-26 DIAGNOSIS — M549 Dorsalgia, unspecified: Secondary | ICD-10-CM | POA: Diagnosis not present

## 2020-08-30 ENCOUNTER — Ambulatory Visit (HOSPITAL_COMMUNITY)
Admission: RE | Admit: 2020-08-30 | Discharge: 2020-08-30 | Disposition: A | Discharge status: Home / Self Care | Payer: Medicare Other | Source: Ambulatory Visit | Attending: "Endocrinology | Admitting: "Endocrinology

## 2020-08-30 ENCOUNTER — Other Ambulatory Visit: Payer: Self-pay

## 2020-08-30 ENCOUNTER — Other Ambulatory Visit (HOSPITAL_COMMUNITY)
Admission: RE | Admit: 2020-08-30 | Discharge: 2020-08-30 | Disposition: A | Discharge status: Home / Self Care | Payer: Medicare Other | Source: Ambulatory Visit | Attending: "Endocrinology | Admitting: "Endocrinology

## 2020-08-30 DIAGNOSIS — E038 Other specified hypothyroidism: Secondary | ICD-10-CM | POA: Insufficient documentation

## 2020-08-30 DIAGNOSIS — E042 Nontoxic multinodular goiter: Secondary | ICD-10-CM | POA: Diagnosis not present

## 2020-08-30 DIAGNOSIS — E118 Type 2 diabetes mellitus with unspecified complications: Secondary | ICD-10-CM | POA: Insufficient documentation

## 2020-08-30 LAB — COMPREHENSIVE METABOLIC PANEL
ALT: 30 U/L (ref 0–44)
AST: 27 U/L (ref 15–41)
Albumin: 4.3 g/dL (ref 3.5–5.0)
Alkaline Phosphatase: 85 U/L (ref 38–126)
Anion gap: 10 (ref 5–15)
BUN: 28 mg/dL — ABNORMAL HIGH (ref 8–23)
CO2: 27 mmol/L (ref 22–32)
Calcium: 10.2 mg/dL (ref 8.9–10.3)
Chloride: 97 mmol/L — ABNORMAL LOW (ref 98–111)
Creatinine, Ser: 1.42 mg/dL — ABNORMAL HIGH (ref 0.44–1.00)
GFR, Estimated: 39 mL/min — ABNORMAL LOW (ref >60)
Glucose, Bld: 270 mg/dL — ABNORMAL HIGH (ref 70–99)
Potassium: 4.5 mmol/L (ref 3.5–5.1)
Sodium: 134 mmol/L — ABNORMAL LOW (ref 135–145)
Total Bilirubin: 0.7 mg/dL (ref 0.3–1.2)
Total Protein: 7.8 g/dL (ref 6.5–8.1)

## 2020-08-30 LAB — T4, FREE: Free T4: 1.47 ng/dL — ABNORMAL HIGH (ref 0.61–1.12)

## 2020-08-30 LAB — TSH: TSH: 3.084 u[IU]/mL (ref 0.350–4.500)

## 2020-09-02 ENCOUNTER — Other Ambulatory Visit: Payer: Self-pay | Admitting: "Endocrinology

## 2020-09-07 DIAGNOSIS — I10 Essential (primary) hypertension: Secondary | ICD-10-CM | POA: Diagnosis not present

## 2020-09-08 ENCOUNTER — Ambulatory Visit (INDEPENDENT_AMBULATORY_CARE_PROVIDER_SITE_OTHER): Payer: Medicare Other | Admitting: "Endocrinology

## 2020-09-08 ENCOUNTER — Encounter: Payer: Self-pay | Admitting: "Endocrinology

## 2020-09-08 VITALS — BP 152/81 | HR 86 | Ht 67.5 in | Wt 228.4 lb

## 2020-09-08 DIAGNOSIS — I10 Essential (primary) hypertension: Secondary | ICD-10-CM

## 2020-09-08 DIAGNOSIS — E118 Type 2 diabetes mellitus with unspecified complications: Secondary | ICD-10-CM | POA: Diagnosis not present

## 2020-09-08 DIAGNOSIS — E782 Mixed hyperlipidemia: Secondary | ICD-10-CM

## 2020-09-08 DIAGNOSIS — E038 Other specified hypothyroidism: Secondary | ICD-10-CM

## 2020-09-08 LAB — POCT GLYCOSYLATED HEMOGLOBIN (HGB A1C): HbA1c, POC (controlled diabetic range): 8.3 % — AB (ref 0.0–7.0)

## 2020-09-08 MED ORDER — TRULICITY 0.75 MG/0.5ML ~~LOC~~ SOAJ: 0.7500 mg | SUBCUTANEOUS | 2 refills | Status: DC

## 2020-09-08 NOTE — Patient Instructions (Signed)

## 2020-09-08 NOTE — Progress Notes (Signed)
09/08/2020   Endocrinology follow-up note   Subjective:    Patient ID: Dana Lawson, female    DOB: 1945-11-03, PCP Kirstie Peri, MD   Past Medical History:  Diagnosis Date   Anxiety    COPD (chronic obstructive pulmonary disease) (HCC)    Depression    Essential hypertension    GERD (gastroesophageal reflux disease)    Hepatic steatosis    Hyperlipidemia    Hypothyroidism    Type 2 diabetes mellitus (HCC)    Past Surgical History:  Procedure Laterality Date   LEFT HEART CATH AND CORONARY ANGIOGRAPHY N/A 11/08/2016   Procedure: LEFT HEART CATH AND CORONARY ANGIOGRAPHY;  Surgeon: Marykay Lex, MD;  Location: [facility] INVASIVE CV LAB;  Service: Cardiovascular;  Laterality: N/A;   TUBAL LIGATION     Social History   Socioeconomic History   Marital status: Married    Spouse name: Not on file   Number of children: Not on file   Years of education: Not on file   Highest education level: Not on file  Occupational History   Not on file  Tobacco Use   Smoking status: Former    Types: Cigarettes    Quit date: 11/06/2015    Years since quitting: 4.8   Smokeless tobacco: Never  Vaping Use   Vaping Use: Never used  Substance and Sexual Activity   Alcohol use: Yes    Alcohol/week: 0.0 standard drinks    Comment: Occasional   Drug use: No   Sexual activity: Not on file  Other Topics Concern   Not on file  Social History Narrative   Not on file   Social Determinants of Health   Financial Resource Strain: Not on file  Food Insecurity: Not on file  Transportation Needs: Not on file  Physical Activity: Not on file  Stress: Not on file  Social Connections: Not on file   Outpatient Encounter Medications as of 09/08/2020  Medication Sig   Dulaglutide (TRULICITY) 0.75 MG/0.5ML SOPN Inject 0.75 mg into the skin once a week.   aspirin 81 MG tablet Take 81 mg by mouth 3 (three) times a week.    benazepril (LOTENSIN) 40 MG tablet Take 40 mg by mouth daily.   Blood Glucose  Monitoring Suppl (FREESTYLE LITE) DEVI 1 each by Does not apply route 2 (two) times daily. Use as instructed to check blood glucose two times a day.   buPROPion (WELLBUTRIN SR) 150 MG 12 hr tablet Take 150 mg by mouth 2 (two) times daily.   chlorhexidine (PERIDEX) 0.12 % solution Use as directed 5 mLs in the mouth or throat 2 (two) times a week.    Cholecalciferol (VITAMIN D3) 125 MCG (5000 UT) CAPS Take 1 capsule (5,000 Units total) by mouth daily. (Patient taking differently: Take 5,000 Units by mouth 2 (two) times a week. )   clonazePAM (KLONOPIN) 0.5 MG tablet Take 0.5 mg by mouth at bedtime.    Coenzyme Q10 (CO Q 10) 100 MG CAPS Take 100 mg by mouth daily.   fenofibrate (TRICOR) 48 MG tablet TAKE 1 TABLET DAILY (Patient taking differently: Take 48 mg by mouth daily. )   furosemide (LASIX) 40 MG tablet Take 40 mg by mouth daily.    GLIPIZIDE XL 5 MG 24 hr tablet TAKE 1 TABLET DAILY WITH BREAKFAST   glucose blood (ACCU-CHEK GUIDE) test strip Use as instructed   glucose blood test strip 1 each by Other route 2 (two) times daily. Use as instructed bid.  E11.65   hydrochlorothiazide (HYDRODIURIL) 25 MG tablet TAKE 1 TABLET(25 MG) BY MOUTH DAILY   Lancets (FREESTYLE) lancets Use as instructed to check blood glucose twice daily   meloxicam (MOBIC) 15 MG tablet Take 15 mg by mouth daily.   metFORMIN (GLUCOPHAGE) 500 MG tablet Take 1 tablet (500 mg total) by mouth 2 (two) times daily with a meal.   omeprazole (PRILOSEC) 20 MG capsule Take 20 mg by mouth daily.   potassium chloride (K-DUR) 10 MEQ tablet Take 10 mEq by mouth daily.   Probiotic Product (PROBIOTIC DAILY PO) Take 1 tablet by mouth daily. Woman's   rosuvastatin (CRESTOR) 5 MG tablet Take 5 mg by mouth daily.   SYNTHROID 150 MCG tablet TAKE 1 TABLET DAILY BEFORE BREAKFAST   No facility-administered encounter medications on file as of 09/08/2020.   ALLERGIES: Allergies  Allergen Reactions   Flagyl [Metronidazole] Other (See Comments)     Shortness of breath   VACCINATION STATUS: Immunization History  Administered Date(s) Administered   Moderna Sars-Covid-2 Vaccination 03/14/2019, 04/15/2019    Diabetes She presents for her follow-up diabetic visit. She has type 2 diabetes mellitus. Onset time: She was diagnosed at approximate age of 89 years. Her disease course has been worsening. There are no hypoglycemic associated symptoms. There are no diabetic associated symptoms. There are no hypoglycemic complications. Symptoms are worsening. There are no diabetic complications. Risk factors for coronary artery disease include diabetes mellitus, dyslipidemia, hypertension, obesity, sedentary lifestyle, post-menopausal and tobacco exposure. Current diabetic treatment includes oral agent (monotherapy). She is following a generally unhealthy diet. When asked about meal planning, she reported none. She has not had a previous visit with a dietitian. She rarely participates in exercise. (She did not bring any logs nor meter.  Her point-of-care A1c is 8.3%, increasing progressively from 6.9%.  She denies hypoglycemia.   ) An ACE inhibitor/angiotensin II receptor blocker is being taken. She does not see a podiatrist.Eye exam is not current.  Review of Systems    Review of systems  Constitutional: + Minimally fluctuating body weight,  current  Body mass index is 35.24 kg/m. , no fatigue, no subjective hyperthermia, no subjective hypothermia    Objective:    BP (!) 152/81   Pulse 86   Ht 5' 7.5" (1.715 m)   Wt 228 lb 6.4 oz (103.6 kg)   BMI 35.24 kg/m   Wt Readings from Last 3 Encounters:  09/08/20 228 lb 6.4 oz (103.6 kg)  06/07/20 233 lb 12.8 oz (106.1 kg)  09/25/19 234 lb (106.1 kg)    Physical Exam  Physical Exam- Limited  Constitutional:  Body mass index is 35.24 kg/m. , not in acute distress, normal state of mind    Recent Results (from the past 2160 hour(s))  TSH     Status: None   Collection Time: 08/30/20  2:21 PM   Result Value Ref Range   TSH 3.084 0.350 - 4.500 uIU/mL    Comment: Performed by a 3rd Generation assay with a functional sensitivity of <=0.01 uIU/mL. Performed at [facility], [address] [place], [place] [address]   T4, free     Status: Abnormal   Collection Time: 08/30/20  2:22 PM  Result Value Ref Range   Free T4 1.47 (H) 0.61 - 1.12 ng/dL    Comment: (NOTE) Biotin ingestion may interfere with free T4 tests. If the results are inconsistent with the TSH level, previous test results, or the clinical presentation, then consider biotin interference. If needed,  order repeat testing after stopping biotin. Performed at [facility], [address], [place], [place] [address]   Comprehensive metabolic panel     Status: Abnormal   Collection Time: 08/30/20  2:23 PM  Result Value Ref Range   Sodium 134 (L) 135 - 145 mmol/L   Potassium 4.5 3.5 - 5.1 mmol/L   Chloride 97 (L) 98 - 111 mmol/L   CO2 27 22 - 32 mmol/L   Glucose, Bld 270 (H) 70 - 99 mg/dL    Comment: Glucose reference range applies only to samples taken after fasting for at least 8 hours.   BUN 28 (H) 8 - 23 mg/dL   Creatinine, Ser 2.70 (H) 0.44 - 1.00 mg/dL   Calcium 35.0 8.9 - 09.3 mg/dL   Total Protein 7.8 6.5 - 8.1 g/dL   Albumin 4.3 3.5 - 5.0 g/dL   AST 27 15 - 41 U/L   ALT 30 0 - 44 U/L   Alkaline Phosphatase 85 38 - 126 U/L   Total Bilirubin 0.7 0.3 - 1.2 mg/dL   GFR, Estimated 39 (L) >60 mL/min    Comment: (NOTE) Calculated using the CKD-EPI Creatinine Equation (2021)    Anion gap 10 5 - 15    Comment: Performed at [facility], [address], [place], [place] [address]  HgB A1c     Status: Abnormal   Collection Time: 09/08/20  2:25 PM  Result Value Ref Range   Hemoglobin A1C     HbA1c POC (<> result, manual entry)     HbA1c, POC (prediabetic range)     HbA1c, POC (controlled diabetic range) 8.3 (A) 0.0 - 7.0 %     Assessment & Plan:   1. Hypothyroidism -Her thyroid function tests are  consistent with appropriate replacement.  She is advised to continue levothyroxine 150 mcg p.o. daily before breakfast.     - We discussed about the correct intake of her thyroid hormone, on empty stomach at fasting, with water, separated by at least 30 minutes from breakfast and other medications,  and separated by more than 4 hours from calcium, iron, multivitamins, acid reflux medications (PPIs). -Patient is made aware of the fact that thyroid hormone replacement is needed for life, dose to be adjusted by periodic monitoring of thyroid function tests.  2.  Type 2 diabetes-   She did not bring any logs nor meter.  Her point-of-care A1c is 8.3%, increasing progressively from 6.9%.  She denies hypoglycemia.    -She is counseled and given lifestyle nutrition choices-whole food, plant-based versus processed meats and sweetened beverages.   She is advised to continue metformin 500 mg p.o. twice daily-daily after breakfast and after supper.  She has benefited and responded to low-dose glipizide , advised to continue glipizide 5 mg XL p.o. daily at breakfast.   - she acknowledges that there is a room for improvement in her food and drink choices. - Suggestion is made for her to avoid simple carbohydrates  from her diet including Cakes, Sweet Desserts, Ice Cream, Soda (diet and regular), Sweet Tea, Candies, Chips, Cookies, Store Bought Juices, Alcohol in Excess of  1-2 drinks a day, Artificial Sweeteners,  Coffee Creamer, and "Sugar-free" Products, Lemonade. This will help patient to have more stable blood glucose profile and potentially avoid unintended weight gain.  She is advised to continue metformin 500 mg p.o. twice daily, glipizide 5 mg p.o. daily at breakfast.  I discussed and added Trulicity 0.75 mg subcutaneously weekly.  Trulicity  will be advanced as tolerated.   3) hypertension:  -Her blood pressure is not controlled to target.  Her urine microalbumin is normal today.  She is currently on  benazepril 40 mg/day, will benefit from adding a diuretic.  I discussed and added HCTZ 25 mg p.o. daily at breakfast.  Until her last visit, she was also on metoprolol 50 mg p.o. twice a day, this medication is not on her list any longer.  She is advised to follow with her PMD for blood pressure medications adjustment.   4) hyperlipidemia: She does have uncontrolled triglyceridemia.  Her triglycerides are improving after she was started on fenofibrate.  She is advised to continue fenofibrate 48 mg p.o. daily along with her Crestor 20 mg p.o. daily at bedtime.    Side effects and precautions discussed with her.  5) Multinodular goiter : -She is documented to have multinodular goiter.  She did not have recent thyroid ultrasound, will have previsit surveillance thyroid ultrasound before her next visit.    6) vitamin D deficiency  -She is currently on vitamin D supplement, D3 5000 units daily .  She recently had normal ABIs.  This study will be repeated in May 2027, or sooner if needed.   - I advised patient to maintain close follow up with Kirstie Peri, MD for primary care needs.     I spent 31 minutes in the care of the patient today including review of labs from CMP, Lipids, Thyroid Function, Hematology (current and previous including abstractions from other facilities); face-to-face time discussing  her blood glucose readings/logs, discussing hypoglycemia and hyperglycemia episodes and symptoms, medications doses, her options of short and long term treatment based on the latest standards of care / guidelines;  discussion about incorporating lifestyle medicine;  and documenting the encounter.    Please refer to Patient Instructions for Blood Glucose Monitoring and Insulin/Medications Dosing Guide"  in media tab for additional information. Please  also refer to " Patient Self Inventory" in the Media  tab for reviewed elements of pertinent patient history.  Dana Lawson participated in the  discussions, expressed understanding, and voiced agreement with the above plans.  All questions were answered to her satisfaction. she is encouraged to contact clinic should she have any questions or concerns prior to her return visit.    Follow up plan: Return in about 3 months (around 12/08/2020) for Bring Meter and Logs- A1c in Office.  Marquis Lunch, MD Phone: [phone]  Fax: [phone]  This note was partially dictated with voice recognition software. Similar sounding words can be transcribed inadequately or may not  be corrected upon review.  09/08/2020, 3:47 PM

## 2020-09-29 DIAGNOSIS — M1990 Unspecified osteoarthritis, unspecified site: Secondary | ICD-10-CM | POA: Diagnosis not present

## 2020-09-29 DIAGNOSIS — M79671 Pain in right foot: Secondary | ICD-10-CM | POA: Diagnosis not present

## 2020-09-29 DIAGNOSIS — M25579 Pain in unspecified ankle and joints of unspecified foot: Secondary | ICD-10-CM | POA: Diagnosis not present

## 2020-10-07 DIAGNOSIS — I10 Essential (primary) hypertension: Secondary | ICD-10-CM | POA: Diagnosis not present

## 2020-11-07 ENCOUNTER — Other Ambulatory Visit: Payer: Self-pay

## 2020-11-07 ENCOUNTER — Telehealth: Payer: Self-pay

## 2020-11-07 DIAGNOSIS — I10 Essential (primary) hypertension: Secondary | ICD-10-CM | POA: Diagnosis not present

## 2020-11-07 MED ORDER — LEVOTHYROXINE SODIUM 150 MCG PO TABS: 150.0000 ug | ORAL_TABLET | Freq: Every day | ORAL | 0 refills | Status: DC

## 2020-11-07 NOTE — Telephone Encounter (Signed)
Medication has been sent to the requested pharmacy. 

## 2020-11-07 NOTE — Telephone Encounter (Signed)
Patient is requesting a refill on her Synthroid [facility] MCG [facility]

## 2020-11-14 ENCOUNTER — Other Ambulatory Visit: Payer: Self-pay | Admitting: "Endocrinology

## 2020-11-17 ENCOUNTER — Other Ambulatory Visit: Payer: Self-pay | Admitting: "Endocrinology

## 2020-11-30 DIAGNOSIS — E7800 Pure hypercholesterolemia, unspecified: Secondary | ICD-10-CM | POA: Diagnosis not present

## 2020-11-30 DIAGNOSIS — Z87891 Personal history of nicotine dependence: Secondary | ICD-10-CM | POA: Diagnosis not present

## 2020-11-30 DIAGNOSIS — R5383 Other fatigue: Secondary | ICD-10-CM | POA: Diagnosis not present

## 2020-11-30 DIAGNOSIS — E039 Hypothyroidism, unspecified: Secondary | ICD-10-CM | POA: Diagnosis not present

## 2020-11-30 DIAGNOSIS — Z7189 Other specified counseling: Secondary | ICD-10-CM | POA: Diagnosis not present

## 2020-11-30 DIAGNOSIS — E1165 Type 2 diabetes mellitus with hyperglycemia: Secondary | ICD-10-CM | POA: Diagnosis not present

## 2020-11-30 DIAGNOSIS — Z0000 Encounter for general adult medical examination without abnormal findings: Secondary | ICD-10-CM | POA: Diagnosis not present

## 2020-11-30 DIAGNOSIS — I10 Essential (primary) hypertension: Secondary | ICD-10-CM | POA: Diagnosis not present

## 2020-11-30 DIAGNOSIS — Z299 Encounter for prophylactic measures, unspecified: Secondary | ICD-10-CM | POA: Diagnosis not present

## 2020-12-05 DIAGNOSIS — E039 Hypothyroidism, unspecified: Secondary | ICD-10-CM | POA: Diagnosis not present

## 2020-12-05 DIAGNOSIS — Z79899 Other long term (current) drug therapy: Secondary | ICD-10-CM | POA: Diagnosis not present

## 2020-12-05 DIAGNOSIS — R5383 Other fatigue: Secondary | ICD-10-CM | POA: Diagnosis not present

## 2020-12-05 DIAGNOSIS — E7800 Pure hypercholesterolemia, unspecified: Secondary | ICD-10-CM | POA: Diagnosis not present

## 2020-12-08 ENCOUNTER — Encounter: Payer: Self-pay | Admitting: "Endocrinology

## 2020-12-08 ENCOUNTER — Other Ambulatory Visit: Payer: Self-pay

## 2020-12-08 ENCOUNTER — Ambulatory Visit (INDEPENDENT_AMBULATORY_CARE_PROVIDER_SITE_OTHER): Payer: Medicare Other | Admitting: "Endocrinology

## 2020-12-08 VITALS — BP 126/78 | HR 72 | Ht 67.5 in | Wt 229.0 lb

## 2020-12-08 DIAGNOSIS — E118 Type 2 diabetes mellitus with unspecified complications: Secondary | ICD-10-CM | POA: Diagnosis not present

## 2020-12-08 DIAGNOSIS — I10 Essential (primary) hypertension: Secondary | ICD-10-CM

## 2020-12-08 DIAGNOSIS — E038 Other specified hypothyroidism: Secondary | ICD-10-CM | POA: Diagnosis not present

## 2020-12-08 DIAGNOSIS — E782 Mixed hyperlipidemia: Secondary | ICD-10-CM | POA: Diagnosis not present

## 2020-12-08 LAB — POCT GLYCOSYLATED HEMOGLOBIN (HGB A1C): HbA1c, POC (controlled diabetic range): 7.8 % — AB (ref 0.0–7.0)

## 2020-12-08 MED ORDER — TRULICITY 1.5 MG/0.5ML ~~LOC~~ SOAJ: 1.5000 mg | SUBCUTANEOUS | 1 refills | Status: DC

## 2020-12-08 NOTE — Progress Notes (Signed)
12/08/2020   Endocrinology follow-up note   Subjective:    Patient ID: Dana Lawson, female    DOB: 06-Dec-1945, PCP Kirstie Peri, MD   Past Medical History:  Diagnosis Date   Anxiety    COPD (chronic obstructive pulmonary disease) (HCC)    Depression    Essential hypertension    GERD (gastroesophageal reflux disease)    Hepatic steatosis    Hyperlipidemia    Hypothyroidism    Type 2 diabetes mellitus (HCC)    Past Surgical History:  Procedure Laterality Date   LEFT HEART CATH AND CORONARY ANGIOGRAPHY N/A 11/08/2016   Procedure: LEFT HEART CATH AND CORONARY ANGIOGRAPHY;  Surgeon: Marykay Lex, MD;  Location: [facility] INVASIVE CV LAB;  Service: Cardiovascular;  Laterality: N/A;   TUBAL LIGATION     Social History   Socioeconomic History   Marital status: Married    Spouse name: Not on file   Number of children: Not on file   Years of education: Not on file   Highest education level: Not on file  Occupational History   Not on file  Tobacco Use   Smoking status: Former    Types: Cigarettes    Quit date: 11/06/2015    Years since quitting: 5.0   Smokeless tobacco: Never  Vaping Use   Vaping Use: Never used  Substance and Sexual Activity   Alcohol use: Yes    Alcohol/week: 0.0 standard drinks    Comment: Occasional   Drug use: No   Sexual activity: Not on file  Other Topics Concern   Not on file  Social History Narrative   Not on file   Social Determinants of Health   Financial Resource Strain: Not on file  Food Insecurity: Not on file  Transportation Needs: Not on file  Physical Activity: Not on file  Stress: Not on file  Social Connections: Not on file   Outpatient Encounter Medications as of 12/08/2020  Medication Sig   Dulaglutide (TRULICITY) 1.5 MG/0.5ML SOPN Inject 1.5 mg into the skin once a week.   aspirin 81 MG tablet Take 81 mg by mouth 3 (three) times a week.    benazepril (LOTENSIN) 40 MG tablet Take 40 mg by mouth daily.   Blood Glucose  Monitoring Suppl (FREESTYLE LITE) DEVI 1 each by Does not apply route 2 (two) times daily. Use as instructed to check blood glucose two times a day.   buPROPion (WELLBUTRIN SR) 150 MG 12 hr tablet Take 150 mg by mouth 2 (two) times daily.   chlorhexidine (PERIDEX) 0.12 % solution Use as directed 5 mLs in the mouth or throat 2 (two) times a week.    Cholecalciferol (VITAMIN D3) 125 MCG (5000 UT) CAPS Take 1 capsule (5,000 Units total) by mouth daily. (Patient taking differently: Take 5,000 Units by mouth 2 (two) times a week. )   clonazePAM (KLONOPIN) 0.5 MG tablet Take 0.5 mg by mouth at bedtime.    Coenzyme Q10 (CO Q 10) 100 MG CAPS Take 100 mg by mouth daily.   fenofibrate (TRICOR) 48 MG tablet TAKE 1 TABLET DAILY   furosemide (LASIX) 40 MG tablet Take 40 mg by mouth daily.    GLIPIZIDE XL 5 MG 24 hr tablet TAKE 1 TABLET DAILY WITH BREAKFAST   glucose blood (ACCU-CHEK GUIDE) test strip Use as instructed   glucose blood test strip 1 each by Other route 2 (two) times daily. Use as instructed bid. E11.65   hydrochlorothiazide (HYDRODIURIL) 25 MG tablet TAKE 1  TABLET(25 MG) BY MOUTH DAILY   Lancets (FREESTYLE) lancets Use as instructed to check blood glucose twice daily   levothyroxine (SYNTHROID) 150 MCG tablet Take 1 tablet (150 mcg total) by mouth daily before breakfast.   meloxicam (MOBIC) 15 MG tablet Take 15 mg by mouth daily.   metFORMIN (GLUCOPHAGE) 500 MG tablet Take 1 tablet (500 mg total) by mouth 2 (two) times daily with a meal.   omeprazole (PRILOSEC) 20 MG capsule Take 20 mg by mouth daily.   potassium chloride (K-DUR) 10 MEQ tablet Take 10 mEq by mouth daily.   Probiotic Product (PROBIOTIC DAILY PO) Take 1 tablet by mouth daily. Woman's   rosuvastatin (CRESTOR) 5 MG tablet Take 5 mg by mouth daily.   [DISCONTINUED] Dulaglutide (TRULICITY) 0.75 MG/0.5ML SOPN Inject 0.75 mg into the skin once a week.   No facility-administered encounter medications on file as of 12/08/2020.    ALLERGIES: Allergies  Allergen Reactions   Flagyl [Metronidazole] Other (See Comments)    Shortness of breath   VACCINATION STATUS: Immunization History  Administered Date(s) Administered   Moderna Sars-Covid-2 Vaccination 03/14/2019, 04/15/2019    Diabetes She presents for her follow-up diabetic visit. She has type 2 diabetes mellitus. Onset time: She was diagnosed at approximate age of 25 years. Her disease course has been improving. There are no hypoglycemic associated symptoms. There are no diabetic associated symptoms. There are no hypoglycemic complications. Symptoms are improving. There are no diabetic complications. Risk factors for coronary artery disease include diabetes mellitus, dyslipidemia, hypertension, obesity, sedentary lifestyle, post-menopausal and tobacco exposure. Current diabetic treatment includes oral agent (monotherapy). Her weight is decreasing steadily. She is following a generally unhealthy diet. When asked about meal planning, she reported none. She has not had a previous visit with a dietitian. She rarely participates in exercise. Her home blood glucose trend is decreasing steadily. (She did not bring any logs nor meter.  She presents with improved A1c of 7.8%  from 8.3%.  ) An ACE inhibitor/angiotensin II receptor blocker is being taken. She does not see a podiatrist.Eye exam is not current.  Review of Systems    Review of systems  Constitutional: + Minimally fluctuating body weight,  current  Body mass index is 35.34 kg/m. , no fatigue, no subjective hyperthermia, no subjective hypothermia    Objective:    BP 126/78   Pulse 72   Ht 5' 7.5" (1.715 m)   Wt 229 lb (103.9 kg)   BMI 35.34 kg/m   Wt Readings from Last 3 Encounters:  12/08/20 229 lb (103.9 kg)  09/08/20 228 lb 6.4 oz (103.6 kg)  06/07/20 233 lb 12.8 oz (106.1 kg)    Physical Exam  Physical Exam- Limited  Constitutional:  Body mass index is 35.34 kg/m. , not in acute distress,  normal state of mind    Recent Results (from the past 2160 hour(s))  HgB A1c     Status: Abnormal   Collection Time: 12/08/20  3:07 PM  Result Value Ref Range   Hemoglobin A1C     HbA1c POC (<> result, manual entry)     HbA1c, POC (prediabetic range)     HbA1c, POC (controlled diabetic range) 7.8 (A) 0.0 - 7.0 %     Assessment & Plan:   1. Hypothyroidism -Her thyroid function tests are consistent with appropriate replacement.  She is advised to continue levothyroxine 150 mcg p.o. daily before breakfast.    - We discussed about the correct intake of her thyroid hormone,  on empty stomach at fasting, with water, separated by at least 30 minutes from breakfast and other medications,  and separated by more than 4 hours from calcium, iron, multivitamins, acid reflux medications (PPIs). -Patient is made aware of the fact that thyroid hormone replacement is needed for life, dose to be adjusted by periodic monitoring of thyroid function tests.   2.  Type 2 diabetes-   She did not bring any logs nor meter.  She presents with improved A1c of 7.8%  from 8.3%.    -She is counseled and given lifestyle nutrition choices-whole food, plant-based versus processed meats and sweetened beverages.   She is advised to continue metformin 500 mg p.o. twice daily-daily after breakfast and after supper.  She is benefiting from low-dose Trulicity.  I discussed and increased her Trulicity to 3 mg subcutaneously weekly.  She is also on glipizide 5 mg XL p.o. daily at breakfast.  - she acknowledges that there is a room for improvement in her food and drink choices. - Suggestion is made for her to avoid simple carbohydrates  from her diet including Cakes, Sweet Desserts, Ice Cream, Soda (diet and regular), Sweet Tea, Candies, Chips, Cookies, Store Bought Juices, Alcohol in Excess of  1-2 drinks a day, Artificial Sweeteners,  Coffee Creamer, and "Sugar-free" Products, Lemonade. This will help patient to have more  stable blood glucose profile and potentially avoid unintended weight gain.   3) hypertension:  -Her blood pressure is not controlled to target.  Her urine microalbumin is normal today.  She is currently on benazepril 40 mg/day, will benefit from adding a diuretic.  She has benefited from addition of hydrochlorothiazide 25 mg p.o. daily at breakfast.  She remains on benazepril 40 mg p.o. daily at breakfast as well.     4) hyperlipidemia: She does have uncontrolled triglyceridemia.  Her triglycerides are improving after she was started on fenofibrate.  She is advised to continue fenofibrate 48 mg p.o. daily along with her Crestor 20 mg p.o. daily at bedtime.    Side effects and precautions discussed with her.  5) Multinodular goiter : -She is documented to have multinodular goiter.  She did not have recent thyroid ultrasound, will have previsit surveillance thyroid ultrasound before her next visit.    6) vitamin D deficiency  -She is currently on vitamin D supplement, D3 5000 units daily .  She recently had normal ABIs.  This study will be repeated in May 2027, or sooner if needed.   - I advised patient to maintain close follow up with Kirstie Peri, MD for primary care needs.   I spent 31 minutes in the care of the patient today including review of labs from Thyroid Function, CMP, and other relevant labs ; imaging/biopsy records (current and previous including abstractions from other facilities); face-to-face time discussing  her lab results and symptoms, medications doses, her options of short and long term treatment based on the latest standards of care / guidelines;   and documenting the encounter.  Dana Lawson  participated in the discussions, expressed understanding, and voiced agreement with the above plans.  All questions were answered to her satisfaction. she is encouraged to contact clinic should she have any questions or concerns prior to her return visit.   Follow up  plan: Return in about 3 months (around 03/08/2021) for F/U with Pre-visit Labs, Meter, Logs, A1c here.Marquis Lunch, MD Phone: [phone]  Fax: [phone]  This note was partially dictated with voice recognition software. Similar  sounding words can be transcribed inadequately or may not  be corrected upon review.  12/08/2020, 4:46 PM

## 2020-12-08 NOTE — Patient Instructions (Signed)

## 2020-12-12 ENCOUNTER — Other Ambulatory Visit: Payer: Self-pay | Admitting: "Endocrinology

## 2021-01-10 DIAGNOSIS — U071 COVID-19: Secondary | ICD-10-CM | POA: Diagnosis not present

## 2021-01-10 DIAGNOSIS — E1165 Type 2 diabetes mellitus with hyperglycemia: Secondary | ICD-10-CM | POA: Diagnosis not present

## 2021-01-10 DIAGNOSIS — Z6836 Body mass index (BMI) 36.0-36.9, adult: Secondary | ICD-10-CM | POA: Diagnosis not present

## 2021-01-10 DIAGNOSIS — J449 Chronic obstructive pulmonary disease, unspecified: Secondary | ICD-10-CM | POA: Diagnosis not present

## 2021-01-10 DIAGNOSIS — Z299 Encounter for prophylactic measures, unspecified: Secondary | ICD-10-CM | POA: Diagnosis not present

## 2021-01-10 DIAGNOSIS — Z87891 Personal history of nicotine dependence: Secondary | ICD-10-CM | POA: Diagnosis not present

## 2021-01-10 DIAGNOSIS — I739 Peripheral vascular disease, unspecified: Secondary | ICD-10-CM | POA: Diagnosis not present

## 2021-02-07 ENCOUNTER — Other Ambulatory Visit: Payer: Self-pay | Admitting: "Endocrinology

## 2021-03-03 DIAGNOSIS — E118 Type 2 diabetes mellitus with unspecified complications: Secondary | ICD-10-CM | POA: Diagnosis not present

## 2021-03-06 DIAGNOSIS — Z87891 Personal history of nicotine dependence: Secondary | ICD-10-CM | POA: Diagnosis not present

## 2021-03-06 DIAGNOSIS — E1165 Type 2 diabetes mellitus with hyperglycemia: Secondary | ICD-10-CM | POA: Diagnosis not present

## 2021-03-06 DIAGNOSIS — Z299 Encounter for prophylactic measures, unspecified: Secondary | ICD-10-CM | POA: Diagnosis not present

## 2021-03-06 DIAGNOSIS — Z789 Other specified health status: Secondary | ICD-10-CM | POA: Diagnosis not present

## 2021-03-06 DIAGNOSIS — K219 Gastro-esophageal reflux disease without esophagitis: Secondary | ICD-10-CM | POA: Diagnosis not present

## 2021-03-06 DIAGNOSIS — I10 Essential (primary) hypertension: Secondary | ICD-10-CM | POA: Diagnosis not present

## 2021-03-08 ENCOUNTER — Ambulatory Visit: Payer: Medicare Other | Admitting: "Endocrinology

## 2021-03-14 ENCOUNTER — Other Ambulatory Visit: Payer: Self-pay

## 2021-03-14 DIAGNOSIS — E038 Other specified hypothyroidism: Secondary | ICD-10-CM

## 2021-03-15 ENCOUNTER — Ambulatory Visit: Payer: TRICARE For Life (TFL) | Admitting: "Endocrinology

## 2021-03-15 ENCOUNTER — Encounter: Payer: Self-pay | Admitting: "Endocrinology

## 2021-03-15 ENCOUNTER — Other Ambulatory Visit: Payer: Self-pay

## 2021-03-15 ENCOUNTER — Ambulatory Visit (INDEPENDENT_AMBULATORY_CARE_PROVIDER_SITE_OTHER): Payer: Medicare HMO | Admitting: "Endocrinology

## 2021-03-15 VITALS — BP 146/66 | HR 72 | Ht 67.5 in | Wt 227.0 lb

## 2021-03-15 DIAGNOSIS — I10 Essential (primary) hypertension: Secondary | ICD-10-CM

## 2021-03-15 DIAGNOSIS — E118 Type 2 diabetes mellitus with unspecified complications: Secondary | ICD-10-CM

## 2021-03-15 DIAGNOSIS — E038 Other specified hypothyroidism: Secondary | ICD-10-CM

## 2021-03-15 DIAGNOSIS — E782 Mixed hyperlipidemia: Secondary | ICD-10-CM | POA: Diagnosis not present

## 2021-03-15 LAB — TSH: TSH: 2.48 u[IU]/mL (ref 0.450–4.500)

## 2021-03-15 LAB — T4, FREE: Free T4: 1.6 ng/dL (ref 0.82–1.77)

## 2021-03-15 LAB — POCT GLYCOSYLATED HEMOGLOBIN (HGB A1C): HbA1c, POC (controlled diabetic range): 7.4 % — AB (ref 0.0–7.0)

## 2021-03-15 MED ORDER — METFORMIN HCL 500 MG PO TABS: 500.0000 mg | ORAL_TABLET | Freq: Two times a day (BID) | ORAL | 1 refills | Status: DC

## 2021-03-15 MED ORDER — GLIPIZIDE ER 5 MG PO TB24: 5.0000 mg | ORAL_TABLET | Freq: Every day | ORAL | 1 refills | Status: DC

## 2021-03-15 MED ORDER — TRULICITY 1.5 MG/0.5ML ~~LOC~~ SOAJ: 1.5000 mg | SUBCUTANEOUS | 1 refills | Status: DC

## 2021-03-15 NOTE — Patient Instructions (Signed)

## 2021-03-15 NOTE — Progress Notes (Signed)
03/15/2021   Endocrinology follow-up note   Subjective:    Patient ID: Dana Lawson, female    DOB: Jan 09, 1946, PCP Monico Blitz, MD   Past Medical History:  Diagnosis Date   Anxiety    COPD (chronic obstructive pulmonary disease) ([place])    Depression    Essential hypertension    GERD (gastroesophageal reflux disease)    Hepatic steatosis    Hyperlipidemia    Hypothyroidism    Type 2 diabetes mellitus ([place])    Past Surgical History:  Procedure Laterality Date   LEFT HEART CATH AND CORONARY ANGIOGRAPHY N/A 11/08/2016   Procedure: LEFT HEART CATH AND CORONARY ANGIOGRAPHY;  Surgeon: Leonie Man, MD;  Location: [place] CV LAB;  Service: Cardiovascular;  Laterality: N/A;   TUBAL LIGATION     Social History   Socioeconomic History   Marital status: Married    Spouse name: Not on file   Number of children: Not on file   Years of education: Not on file   Highest education level: Not on file  Occupational History   Not on file  Tobacco Use   Smoking status: Former    Types: Cigarettes    Quit date: 11/06/2015    Years since quitting: 5.3   Smokeless tobacco: Never  Vaping Use   Vaping Use: Never used  Substance and Sexual Activity   Alcohol use: Yes    Alcohol/week: 0.0 standard drinks    Comment: Occasional   Drug use: No   Sexual activity: Not on file  Other Topics Concern   Not on file  Social History Narrative   Not on file   Social Determinants of Health   Financial Resource Strain: Not on file  Food Insecurity: Not on file  Transportation Needs: Not on file  Physical Activity: Not on file  Stress: Not on file  Social Connections: Not on file   Outpatient Encounter Medications as of 03/15/2021  Medication Sig   aspirin 81 MG tablet Take 81 mg by mouth 3 (three) times a week.    benazepril (LOTENSIN) 40 MG tablet Take 40 mg by mouth daily.   Blood Glucose Monitoring Suppl (FREESTYLE LITE) DEVI 1 each by Does not apply route 2 (two) times daily.  Use as instructed to check blood glucose two times a day.   buPROPion (WELLBUTRIN SR) 150 MG 12 hr tablet Take 150 mg by mouth 2 (two) times daily.   chlorhexidine (PERIDEX) 0.12 % solution Use as directed 5 mLs in the mouth or throat 2 (two) times a week.    Cholecalciferol (VITAMIN D3) 125 MCG (5000 UT) CAPS Take 1 capsule (5,000 Units total) by mouth daily. (Patient taking differently: Take 5,000 Units by mouth 2 (two) times a week. )   clonazePAM (KLONOPIN) 0.5 MG tablet Take 0.5 mg by mouth at bedtime.    Coenzyme Q10 (CO Q 10) 100 MG CAPS Take 100 mg by mouth daily.   Dulaglutide (TRULICITY) 1.5 0000000 SOPN Inject 1.5 mg into the skin once a week.   fenofibrate (TRICOR) 48 MG tablet TAKE 1 TABLET DAILY   furosemide (LASIX) 40 MG tablet Take 40 mg by mouth daily.    glipiZIDE (GLIPIZIDE XL) 5 MG 24 hr tablet Take 1 tablet (5 mg total) by mouth daily with breakfast.   glucose blood (ACCU-CHEK GUIDE) test strip Use as instructed   glucose blood test strip 1 each by Other route 2 (two) times daily. Use as instructed bid. E11.65   hydrochlorothiazide (  HYDRODIURIL) 25 MG tablet TAKE 1 TABLET(25 MG) BY MOUTH DAILY (Patient not taking: Reported on 03/15/2021)   JARDIANCE 25 MG TABS tablet Take 25 mg by mouth daily.   Lancets (FREESTYLE) lancets Use as instructed to check blood glucose twice daily   meloxicam (MOBIC) 15 MG tablet Take 15 mg by mouth daily.   metFORMIN (GLUCOPHAGE) 500 MG tablet Take 1 tablet (500 mg total) by mouth 2 (two) times daily with a meal.   omeprazole (PRILOSEC) 20 MG capsule Take 20 mg by mouth daily.   potassium chloride (K-DUR) 10 MEQ tablet Take 10 mEq by mouth daily.   Probiotic Product (PROBIOTIC DAILY PO) Take 1 tablet by mouth daily. Woman's   rosuvastatin (CRESTOR) 5 MG tablet Take 5 mg by mouth daily.   SYNTHROID 150 MCG tablet TAKE 1 TABLET DAILY BEFORE BREAKFAST   [DISCONTINUED] Dulaglutide (TRULICITY) 1.5 0000000 SOPN Inject 1.5 mg into the skin once a week.    [DISCONTINUED] GLIPIZIDE XL 5 MG 24 hr tablet TAKE 1 TABLET DAILY WITH BREAKFAST   [DISCONTINUED] metFORMIN (GLUCOPHAGE) 500 MG tablet Take 1 tablet (500 mg total) by mouth 2 (two) times daily with a meal.   No facility-administered encounter medications on file as of 03/15/2021.   ALLERGIES: Allergies  Allergen Reactions   Flagyl [Metronidazole] Other (See Comments)    Shortness of breath   VACCINATION STATUS: Immunization History  Administered Date(s) Administered   Moderna Sars-Covid-2 Vaccination 03/14/2019, 04/15/2019    Diabetes She presents for her follow-up diabetic visit. She has type 2 diabetes mellitus. Onset time: She was diagnosed at approximate age of 44 years. Her disease course has been improving. There are no hypoglycemic associated symptoms. There are no diabetic associated symptoms. There are no hypoglycemic complications. Symptoms are improving. There are no diabetic complications. Risk factors for coronary artery disease include diabetes mellitus, dyslipidemia, hypertension, obesity, sedentary lifestyle, post-menopausal and tobacco exposure. Current diabetic treatment includes oral agent (monotherapy). Her weight is decreasing steadily. She is following a generally unhealthy diet. When asked about meal planning, she reported none. She has not had a previous visit with a dietitian. She rarely participates in exercise. Her home blood glucose trend is decreasing steadily. Her overall blood glucose range is 140-180 mg/dl. (She presents with near target glucose profile, POC a1c of 7.4% progressively improving. She does not report hypoglycemia. In the interim, she was started by Jardiance by her PCP.) An ACE inhibitor/angiotensin II receptor blocker is being taken. She does not see a podiatrist.Eye exam is not current.  Review of Systems    Review of systems  Constitutional: + Minimally fluctuating body weight,  current  Body mass index is 35.03 kg/m. , no fatigue, no  subjective hyperthermia, no subjective hypothermia    Objective:    BP (!) 146/66    Pulse 72    Ht 5' 7.5" (1.715 m)    Wt 227 lb (103 kg)    BMI 35.03 kg/m   Wt Readings from Last 3 Encounters:  03/15/21 227 lb (103 kg)  12/08/20 229 lb (103.9 kg)  09/08/20 228 lb 6.4 oz (103.6 kg)    Physical Exam  Physical Exam- Limited  Constitutional:  Body mass index is 35.03 kg/m. , not in acute distress, normal state of mind    Recent Results (from the past 2160 hour(s))  T4, Free     Status: None   Collection Time: 03/14/21  3:08 PM  Result Value Ref Range   Free T4 1.60 0.82 -  1.77 ng/dL  TSH     Status: None   Collection Time: 03/14/21  3:08 PM  Result Value Ref Range   TSH 2.480 0.450 - 4.500 uIU/mL  HgB A1c     Status: Abnormal   Collection Time: 03/15/21  3:16 PM  Result Value Ref Range   Hemoglobin A1C     HbA1c POC (<> result, manual entry)     HbA1c, POC (prediabetic range)     HbA1c, POC (controlled diabetic range) 7.4 (A) 0.0 - 7.0 %     Assessment & Plan:   1. Hypothyroidism -Her thyroid function tests are c/w  appropriate replacement.  She is advised to continue levothyroxine 150 mcg p.o. daily before breakfast.     - We discussed about the correct intake of her thyroid hormone, on empty stomach at fasting, with water, separated by at least 30 minutes from breakfast and other medications,  and separated by more than 4 hours from calcium, iron, multivitamins, acid reflux medications (PPIs). -Patient is made aware of the fact that thyroid hormone replacement is needed for life, dose to be adjusted by periodic monitoring of thyroid function tests.   2.  Type 2 diabetes-    She presents with near target glucose profile, POC a1c of 7.4% progressively improving. She does not report hypoglycemia. In the interim, she was started by [facility] by her PCP.    She is advised to continue metformin 500 mg p.o. twice daily-daily after breakfast and after supper.  She is  benefiting from [employer].  I advised her to continue  Trulicity 1.5 mg subcutaneously weekly.  She is also on glipizide 5 mg XL p.o. daily at breakfast. Her PCP started her on Jardiance. Patient says she was not informed of SE. I discussed side effects of jardiance including genital infections, rash, and rare necrotizing fasciitis. She should be condidered for a lower dose next refill.  - she acknowledges that there is a room for improvement in her food and drink choices. - Suggestion is made for her to avoid simple carbohydrates  from her diet including Cakes, Sweet Desserts, Ice Cream, Soda (diet and regular), Sweet Tea, Candies, Chips, Cookies, Store Bought Juices, Alcohol in Excess of  1-2 drinks a day, Artificial Sweeteners,  Coffee Creamer, and "Sugar-free" Products, Lemonade. This will help patient to have more stable blood glucose profile and potentially avoid unintended weight gain.   3) hypertension:  -Her blood pressure is not controlled to target.  Her urine microalbumin is normal today.  She is currently on benazepril 40 mg/day, will benefit from adding a diuretic.  She has benefited from addition of hydrochlorothiazide 25 mg p.o. daily at breakfast.  She remains on benazepril 40 mg p.o. daily at breakfast as well.     4) hyperlipidemia: She does have uncontrolled triglyceridemia.  Her triglycerides are improving after she was started on fenofibrate.  She is advised to continue fenofibrate 48 mg p.o. daily along with her Crestor 20 mg p.o. daily at bedtime.    Side effects and precautions discussed with her.  5) Multinodular goiter : -She is documented to have multinodular goiter. Thyroid ultrasound in 8/22   Right lobe: 3.8 x 1.8 x 1.5 cm,  Left lobe: 4.4 x 2.5 x 1.3 cm  IMPRESSION: Solitary left thyroid nodule demonstrates minimal growth since 2013 which indicates a benign etiology. Given the location of this nodule, it could also represent a parathyroid adenoma.   6) vitamin D  deficiency  -She is currently on  vitamin D supplement, D3 5000 units daily .  She recently had normal ABIs.  This study will be repeated in May 2027, or sooner if needed.   - I advised patient to maintain close follow up with Monico Blitz, MD for primary care needs.    I spent 31 minutes in the care of the patient today including review of labs from Thyroid Function, CMP, and other relevant labs ; imaging/biopsy records (current and previous including abstractions from other facilities); face-to-face time discussing  her lab results and symptoms, medications doses, her options of short and long term treatment based on the latest standards of care / guidelines;   and documenting the encounter.  Dana Lawson  participated in the discussions, expressed understanding, and voiced agreement with the above plans.  All questions were answered to her satisfaction. she is encouraged to contact clinic should she have any questions or concerns prior to her return visit.    Follow up plan: Return in about 6 months (around 09/15/2021) for F/U with Pre-visit Labs, Meter, Logs, A1c here.Glade Lloyd, MD Phone: [phone]  Fax: [phone]  This note was partially dictated with voice recognition software. Similar sounding words can be transcribed inadequately or may not  be corrected upon review.  03/15/2021, 4:09 PM

## 2021-04-20 DIAGNOSIS — Z961 Presence of intraocular lens: Secondary | ICD-10-CM | POA: Diagnosis not present

## 2021-04-20 DIAGNOSIS — E119 Type 2 diabetes mellitus without complications: Secondary | ICD-10-CM | POA: Diagnosis not present

## 2021-04-20 DIAGNOSIS — Z0100 Encounter for examination of eyes and vision without abnormal findings: Secondary | ICD-10-CM | POA: Diagnosis not present

## 2021-04-20 DIAGNOSIS — Z7984 Long term (current) use of oral hypoglycemic drugs: Secondary | ICD-10-CM | POA: Diagnosis not present

## 2021-04-20 DIAGNOSIS — H16223 Keratoconjunctivitis sicca, not specified as Sjogren's, bilateral: Secondary | ICD-10-CM | POA: Diagnosis not present

## 2021-04-20 LAB — HM DIABETES EYE EXAM

## 2021-04-26 ENCOUNTER — Telehealth: Payer: Self-pay | Admitting: "Endocrinology

## 2021-04-26 DIAGNOSIS — E038 Other specified hypothyroidism: Secondary | ICD-10-CM

## 2021-04-26 MED ORDER — LEVOTHYROXINE SODIUM 150 MCG PO TABS: 150.0000 ug | ORAL_TABLET | Freq: Every day | ORAL | 0 refills | Status: DC

## 2021-04-26 NOTE — Telephone Encounter (Signed)
Patient is requesting a refill on her Synthroid [facility] MCG [facility] 

## 2021-04-26 NOTE — Telephone Encounter (Signed)
Rx sent 

## 2021-06-06 DIAGNOSIS — E1165 Type 2 diabetes mellitus with hyperglycemia: Secondary | ICD-10-CM | POA: Diagnosis not present

## 2021-06-06 DIAGNOSIS — Z6836 Body mass index (BMI) 36.0-36.9, adult: Secondary | ICD-10-CM | POA: Diagnosis not present

## 2021-06-06 DIAGNOSIS — I10 Essential (primary) hypertension: Secondary | ICD-10-CM | POA: Diagnosis not present

## 2021-06-06 DIAGNOSIS — E1122 Type 2 diabetes mellitus with diabetic chronic kidney disease: Secondary | ICD-10-CM | POA: Diagnosis not present

## 2021-06-06 DIAGNOSIS — F419 Anxiety disorder, unspecified: Secondary | ICD-10-CM | POA: Diagnosis not present

## 2021-06-06 DIAGNOSIS — Z299 Encounter for prophylactic measures, unspecified: Secondary | ICD-10-CM | POA: Diagnosis not present

## 2021-06-06 DIAGNOSIS — N183 Chronic kidney disease, stage 3 unspecified: Secondary | ICD-10-CM | POA: Diagnosis not present

## 2021-06-06 DIAGNOSIS — Z789 Other specified health status: Secondary | ICD-10-CM | POA: Diagnosis not present

## 2021-06-06 DIAGNOSIS — M5386 Other specified dorsopathies, lumbar region: Secondary | ICD-10-CM | POA: Diagnosis not present

## 2021-06-28 DIAGNOSIS — K219 Gastro-esophageal reflux disease without esophagitis: Secondary | ICD-10-CM | POA: Diagnosis not present

## 2021-06-28 DIAGNOSIS — I10 Essential (primary) hypertension: Secondary | ICD-10-CM | POA: Diagnosis not present

## 2021-06-28 DIAGNOSIS — N1832 Chronic kidney disease, stage 3b: Secondary | ICD-10-CM | POA: Diagnosis not present

## 2021-06-28 DIAGNOSIS — E039 Hypothyroidism, unspecified: Secondary | ICD-10-CM | POA: Diagnosis not present

## 2021-06-28 DIAGNOSIS — Z299 Encounter for prophylactic measures, unspecified: Secondary | ICD-10-CM | POA: Diagnosis not present

## 2021-06-28 DIAGNOSIS — E1122 Type 2 diabetes mellitus with diabetic chronic kidney disease: Secondary | ICD-10-CM | POA: Diagnosis not present

## 2021-06-28 DIAGNOSIS — Z87891 Personal history of nicotine dependence: Secondary | ICD-10-CM | POA: Diagnosis not present

## 2021-06-28 DIAGNOSIS — Z6836 Body mass index (BMI) 36.0-36.9, adult: Secondary | ICD-10-CM | POA: Diagnosis not present

## 2021-07-27 DIAGNOSIS — Z87891 Personal history of nicotine dependence: Secondary | ICD-10-CM | POA: Diagnosis not present

## 2021-07-27 DIAGNOSIS — Z299 Encounter for prophylactic measures, unspecified: Secondary | ICD-10-CM | POA: Diagnosis not present

## 2021-07-27 DIAGNOSIS — M5430 Sciatica, unspecified side: Secondary | ICD-10-CM | POA: Diagnosis not present

## 2021-07-27 DIAGNOSIS — I10 Essential (primary) hypertension: Secondary | ICD-10-CM | POA: Diagnosis not present

## 2021-07-27 DIAGNOSIS — Z713 Dietary counseling and surveillance: Secondary | ICD-10-CM | POA: Diagnosis not present

## 2021-08-08 ENCOUNTER — Other Ambulatory Visit: Payer: Self-pay | Admitting: "Endocrinology

## 2021-08-08 DIAGNOSIS — E038 Other specified hypothyroidism: Secondary | ICD-10-CM

## 2021-09-12 ENCOUNTER — Other Ambulatory Visit: Payer: Self-pay | Admitting: "Endocrinology

## 2021-09-12 DIAGNOSIS — E1165 Type 2 diabetes mellitus with hyperglycemia: Secondary | ICD-10-CM | POA: Diagnosis not present

## 2021-09-12 DIAGNOSIS — I10 Essential (primary) hypertension: Secondary | ICD-10-CM | POA: Diagnosis not present

## 2021-09-12 DIAGNOSIS — M5386 Other specified dorsopathies, lumbar region: Secondary | ICD-10-CM | POA: Diagnosis not present

## 2021-09-12 DIAGNOSIS — E038 Other specified hypothyroidism: Secondary | ICD-10-CM | POA: Diagnosis not present

## 2021-09-12 DIAGNOSIS — Z6834 Body mass index (BMI) 34.0-34.9, adult: Secondary | ICD-10-CM | POA: Diagnosis not present

## 2021-09-12 DIAGNOSIS — Z87891 Personal history of nicotine dependence: Secondary | ICD-10-CM | POA: Diagnosis not present

## 2021-09-12 DIAGNOSIS — Z299 Encounter for prophylactic measures, unspecified: Secondary | ICD-10-CM | POA: Diagnosis not present

## 2021-09-12 DIAGNOSIS — E118 Type 2 diabetes mellitus with unspecified complications: Secondary | ICD-10-CM | POA: Diagnosis not present

## 2021-09-13 LAB — COMPLETE METABOLIC PANEL WITH GFR
AG Ratio: 1.7 (calc) (ref 1.0–2.5)
ALT: 20 U/L (ref 6–29)
AST: 19 U/L (ref 10–35)
Albumin: 4.6 g/dL (ref 3.6–5.1)
Alkaline phosphatase (APISO): 107 U/L (ref 37–153)
BUN/Creatinine Ratio: 16 (calc) (ref 6–22)
BUN: 23 mg/dL (ref 7–25)
CO2: 26 mmol/L (ref 20–32)
Calcium: 10.3 mg/dL (ref 8.6–10.4)
Chloride: 101 mmol/L (ref 98–110)
Creat: 1.44 mg/dL — ABNORMAL HIGH (ref 0.60–1.00)
Globulin: 2.7 g/dL (calc) (ref 1.9–3.7)
Glucose, Bld: 112 mg/dL — ABNORMAL HIGH (ref 65–99)
Potassium: 5.2 mmol/L (ref 3.5–5.3)
Sodium: 138 mmol/L (ref 135–146)
Total Bilirubin: 0.5 mg/dL (ref 0.2–1.2)
Total Protein: 7.3 g/dL (ref 6.1–8.1)
eGFR: 38 mL/min/{1.73_m2} — ABNORMAL LOW (ref >=60)

## 2021-09-13 LAB — LIPID PANEL
Cholesterol: 177 mg/dL (ref <200)
HDL: 42 mg/dL — ABNORMAL LOW (ref >=50)
LDL Cholesterol (Calc): 90 mg/dL (calc)
Non-HDL Cholesterol (Calc): 135 mg/dL (calc) — ABNORMAL HIGH (ref <130)
Total CHOL/HDL Ratio: 4.2 (calc) (ref <5.0)
Triglycerides: 340 mg/dL — ABNORMAL HIGH (ref <150)

## 2021-09-13 LAB — T4, FREE: Free T4: 1.4 ng/dL (ref 0.8–1.8)

## 2021-09-13 LAB — TSH: TSH: 2.81 mIU/L (ref 0.40–4.50)

## 2021-09-18 ENCOUNTER — Encounter: Payer: Self-pay | Admitting: "Endocrinology

## 2021-09-18 ENCOUNTER — Ambulatory Visit (INDEPENDENT_AMBULATORY_CARE_PROVIDER_SITE_OTHER): Payer: Medicare PPO | Admitting: "Endocrinology

## 2021-09-18 VITALS — BP 112/62 | HR 72 | Ht 67.5 in | Wt 219.4 lb

## 2021-09-18 DIAGNOSIS — E038 Other specified hypothyroidism: Secondary | ICD-10-CM

## 2021-09-18 DIAGNOSIS — E118 Type 2 diabetes mellitus with unspecified complications: Secondary | ICD-10-CM | POA: Diagnosis not present

## 2021-09-18 DIAGNOSIS — E782 Mixed hyperlipidemia: Secondary | ICD-10-CM

## 2021-09-18 DIAGNOSIS — I10 Essential (primary) hypertension: Secondary | ICD-10-CM

## 2021-09-18 LAB — POCT GLYCOSYLATED HEMOGLOBIN (HGB A1C): HbA1c, POC (controlled diabetic range): 6.7 % (ref 0.0–7.0)

## 2021-09-18 MED ORDER — EMPAGLIFLOZIN 10 MG PO TABS: 10.0000 mg | ORAL_TABLET | Freq: Every day | ORAL | 1 refills | Status: DC

## 2021-09-18 NOTE — Patient Instructions (Signed)

## 2021-09-18 NOTE — Progress Notes (Signed)
09/18/2021   Endocrinology follow-up note   Subjective:    Patient ID: Dana Lawson, female    DOB: [birth year], PCP Monico Blitz, MD   Past Medical History:  Diagnosis Date   Anxiety    COPD (chronic obstructive pulmonary disease) ([place])    Depression    Essential hypertension    GERD (gastroesophageal reflux disease)    Hepatic steatosis    Hyperlipidemia    Hypothyroidism    Type 2 diabetes mellitus ([place])    Past Surgical History:  Procedure Laterality Date   LEFT HEART CATH AND CORONARY ANGIOGRAPHY N/A 11/08/2016   Procedure: LEFT HEART CATH AND CORONARY ANGIOGRAPHY;  Surgeon: Leonie Man, MD;  Location: [facility];  Service: Cardiovascular;  Laterality: N/A;   TUBAL LIGATION     Social History   Socioeconomic History   Marital status: Married    Spouse name: Not on file   Number of children: Not on file   Years of education: Not on file   Highest education level: Not on file  Occupational History   Not on file  Tobacco Use   Smoking status: Former    Types: Cigarettes    Quit date: 11/06/2015    Years since quitting: 5.8   Smokeless tobacco: Never  Vaping Use   Vaping Use: Never used  Substance and Sexual Activity   Alcohol use: Yes    Alcohol/week: 0.0 standard drinks of alcohol    Comment: Occasional   Drug use: No   Sexual activity: Not on file  Other Topics Concern   Not on file  Social History Narrative   Not on file   Social Determinants of Health   Financial Resource Strain: Not on file  Food Insecurity: Not on file  Transportation Needs: Not on file  Physical Activity: Not on file  Stress: Not on file  Social Connections: Not on file   Outpatient Encounter Medications as of 09/18/2021  Medication Sig   empagliflozin (JARDIANCE) 10 MG TABS tablet Take 1 tablet (10 mg total) by mouth daily before breakfast.   aspirin 81 MG tablet Take 81 mg by mouth 3 (three) times a week.    benazepril (LOTENSIN) 40 MG tablet Take 40 mg by  mouth daily.   Blood Glucose Monitoring Suppl (FREESTYLE LITE) DEVI 1 each by Does not apply route 2 (two) times daily. Use as instructed to check blood glucose two times a day.   buPROPion (WELLBUTRIN SR) 150 MG 12 hr tablet Take 150 mg by mouth 2 (two) times daily.   chlorhexidine (PERIDEX) 0.12 % solution Use as directed 5 mLs in the mouth or throat 2 (two) times a week.    Cholecalciferol (VITAMIN D3) 125 MCG (5000 UT) CAPS Take 1 capsule (5,000 Units total) by mouth daily. (Patient taking differently: Take 5,000 Units by mouth 2 (two) times a week. )   clonazePAM (KLONOPIN) 0.5 MG tablet Take 0.5 mg by mouth at bedtime.    Coenzyme Q10 (CO Q 10) 100 MG CAPS Take 100 mg by mouth daily.   Dulaglutide (TRULICITY) 1.5 ZO/1.0RU SOPN Inject 1.5 mg into the skin once a week.   fenofibrate (TRICOR) 48 MG tablet TAKE 1 TABLET DAILY   furosemide (LASIX) 40 MG tablet Take 40 mg by mouth daily.    glipiZIDE (GLIPIZIDE XL) 5 MG 24 hr tablet Take 1 tablet (5 mg total) by mouth daily with breakfast.   glucose blood (ACCU-CHEK GUIDE) test strip Use as instructed  glucose blood test strip 1 each by Other route 2 (two) times daily. Use as instructed bid. E11.65   Lancets (FREESTYLE) lancets Use as instructed to check blood glucose twice daily   meloxicam (MOBIC) 15 MG tablet Take 15 mg by mouth daily.   metFORMIN (GLUCOPHAGE) 500 MG tablet Take 1 tablet (500 mg total) by mouth 2 (two) times daily with a meal. (Patient not taking: Reported on 09/18/2021)   omeprazole (PRILOSEC) 20 MG capsule Take 20 mg by mouth daily.   potassium chloride (K-DUR) 10 MEQ tablet Take 10 mEq by mouth daily.   Probiotic Product (PROBIOTIC DAILY PO) Take 1 tablet by mouth daily. Woman's   rosuvastatin (CRESTOR) 5 MG tablet Take 5 mg by mouth daily.   SYNTHROID 150 MCG tablet TAKE 1 TABLET DAILY BEFORE BREAKFAST   [DISCONTINUED] hydrochlorothiazide (HYDRODIURIL) 25 MG tablet TAKE 1 TABLET(25 MG) BY MOUTH DAILY (Patient not taking:  Reported on 03/15/2021)   [DISCONTINUED] JARDIANCE 25 MG TABS tablet Take 25 mg by mouth daily.   No facility-administered encounter medications on file as of 09/18/2021.   ALLERGIES: Allergies  Allergen Reactions   Flagyl [Metronidazole] Other (See Comments)    Shortness of breath   VACCINATION STATUS: Immunization History  Administered Date(s) Administered   Moderna Sars-Covid-2 Vaccination 03/14/2019, 04/15/2019    Diabetes She presents for her follow-up diabetic visit. She has type 2 diabetes mellitus. Onset time: She was diagnosed at approximate age of 33 years. Her disease course has been stable. There are no hypoglycemic associated symptoms. There are no diabetic associated symptoms. There are no hypoglycemic complications. Symptoms are improving. There are no diabetic complications. Risk factors for coronary artery disease include diabetes mellitus, dyslipidemia, hypertension, obesity, sedentary lifestyle, post-menopausal and tobacco exposure. Current diabetic treatment includes oral agent (monotherapy). Her weight is decreasing steadily. She is following a generally unhealthy diet. When asked about meal planning, she reported none. She has not had a previous visit with a dietitian. She rarely participates in exercise. Her home blood glucose trend is decreasing steadily. Her breakfast blood glucose range is generally 130-140 mg/dl. Her overall blood glucose range is 130-140 mg/dl. (She presents with near target glucose profile, POC a1c of 6.7%, overall improving.  She did not document any hypoglycemia.   ) An ACE inhibitor/angiotensin II receptor blocker is being taken. She does not see a podiatrist.Eye exam is not current.   Review of Systems    Review of systems  Constitutional: + Minimally fluctuating body weight,  current  Body mass index is 33.86 kg/m. , no fatigue, no subjective hyperthermia, no subjective hypothermia    Objective:    BP 112/62   Pulse 72   Ht 5' 7.5"  (1.715 m)   Wt 219 lb 6.4 oz (99.5 kg)   BMI 33.86 kg/m   Wt Readings from Last 3 Encounters:  09/18/21 219 lb 6.4 oz (99.5 kg)  03/15/21 227 lb (103 kg)  12/08/20 229 lb (103.9 kg)    Physical Exam  Physical Exam- Limited  Constitutional:  Body mass index is 33.86 kg/m. , not in acute distress, normal state of mind    Recent Results (from the past 2160 hour(s))  Lipid panel     Status: Abnormal   Collection Time: 09/12/21  2:32 PM  Result Value Ref Range   Cholesterol 177 <200 mg/dL   HDL 42 (L) > OR = 50 mg/dL   Triglycerides 340 (H) <150 mg/dL    Comment: . If a non-fasting specimen was  collected, consider repeat triglyceride testing on a fasting specimen if clinically indicated.  Yates Decamp et al. J. of Clin. Lipidol. 3536;1:443-154. Marland Kitchen    LDL Cholesterol (Calc) 90 mg/dL (calc)    Comment: Reference range: <100 . Desirable range <100 mg/dL for primary prevention;   <70 mg/dL for patients with CHD or diabetic patients  with > or = 2 CHD risk factors. Marland Kitchen LDL-C is now calculated using the Martin-Hopkins  calculation, which is a validated novel method providing  better accuracy than the Friedewald equation in the  estimation of LDL-C.  Cresenciano Genre et al. Annamaria Helling. 0086;761(95): 2061-2068  (http://education.[facility].com/faq/FAQ164)    Total CHOL/HDL Ratio 4.2 <5.0 (calc)   Non-HDL Cholesterol (Calc) 135 (H) <130 mg/dL (calc)    Comment: For patients with diabetes plus 1 major ASCVD risk  factor, treating to a non-HDL-C goal of <100 mg/dL  (LDL-C of <70 mg/dL) is considered a therapeutic  option.   COMPLETE METABOLIC PANEL WITH GFR     Status: Abnormal   Collection Time: 09/12/21  2:32 PM  Result Value Ref Range   Glucose, Bld 112 (H) 65 - 99 mg/dL    Comment: .            Fasting reference interval . For someone without known diabetes, a glucose value between 100 and 125 mg/dL is consistent with prediabetes and should be confirmed with a follow-up  test. .    BUN 23 7 - 25 mg/dL   Creat 1.44 (H) 0.60 - 1.00 mg/dL   eGFR 38 (L) > OR = 60 mL/min/1.72m   BUN/Creatinine Ratio 16 6 - 22 (calc)   Sodium 138 135 - 146 mmol/L   Potassium 5.2 3.5 - 5.3 mmol/L   Chloride 101 98 - 110 mmol/L   CO2 26 20 - 32 mmol/L   Calcium 10.3 8.6 - 10.4 mg/dL   Total Protein 7.3 6.1 - 8.1 g/dL   Albumin 4.6 3.6 - 5.1 g/dL   Globulin 2.7 1.9 - 3.7 g/dL (calc)   AG Ratio 1.7 1.0 - 2.5 (calc)   Total Bilirubin 0.5 0.2 - 1.2 mg/dL   Alkaline phosphatase (APISO) 107 37 - 153 U/L   AST 19 10 - 35 U/L   ALT 20 6 - 29 U/L  TSH     Status: None   Collection Time: 09/12/21  2:32 PM  Result Value Ref Range   TSH 2.81 0.40 - 4.50 mIU/L  T4, free     Status: None   Collection Time: 09/12/21  2:32 PM  Result Value Ref Range   Free T4 1.4 0.8 - 1.8 ng/dL  HgB A1c     Status: None   Collection Time: 09/18/21  2:19 PM  Result Value Ref Range   Hemoglobin A1C     HbA1c POC (<> result, manual entry)     HbA1c, POC (prediabetic range)     HbA1c, POC (controlled diabetic range) 6.7 0.0 - 7.0 %     Assessment & Plan:   1. Hypothyroidism -Her thyroid function tests are c/w  appropriate replacement.  She is advised to continue levothyroxine 150 mcg p.o. daily before breakfast.    - We discussed about the correct intake of her thyroid hormone, on empty stomach at fasting, with water, separated by at least 30 minutes from breakfast and other medications,  and separated by more than 4 hours from calcium, iron, multivitamins, acid reflux medications (PPIs). -Patient is made aware of the fact that thyroid hormone replacement is  needed for life, dose to be adjusted by periodic monitoring of thyroid function tests.    2.  Type 2 diabetes-    She presents with near target glucose profile, POC a1c of 6.7%, overall improving.  She did not document any hypoglycemia.      She is advised to continue metformin 500 mg p.o. twice daily-daily after breakfast and after  supper.  She is benefiting from [employer].  I advised her to continue  Trulicity 1.5 mg subcutaneously weekly.  She is also on glipizide 5 mg XL p.o. daily at breakfast. She is advised to lower her Jardiance to 10 mg p.o. nightly.  She is taken off of metformin.  - she acknowledges that there is a room for improvement in her food and drink choices. - Suggestion is made for her to avoid simple carbohydrates  from her diet including Cakes, Sweet Desserts, Ice Cream, Soda (diet and regular), Sweet Tea, Candies, Chips, Cookies, Store Bought Juices, Alcohol in Excess of  1-2 drinks a day, Artificial Sweeteners,  Coffee Creamer, and "Sugar-free" Products, Lemonade. This will help patient to have more stable blood glucose profile and potentially avoid unintended weight gain.   3) hypertension:  -Her blood pressure is not controlled to target.  Her urine microalbumin is normal today.  She is currently on benazepril 40 mg/day, will benefit from adding a diuretic.  She has benefited from addition of hydrochlorothiazide 25 mg p.o. daily at breakfast.  She remains on benazepril 40 mg p.o. daily at breakfast as well.     4) hyperlipidemia: She does have uncontrolled triglyceridemia.  Her triglycerides are improving after she was started on fenofibrate.  She is advised to continue fenofibrate 48 mg p.o. daily along with her Crestor 20 mg p.o. daily at bedtime.    Side effects and precautions discussed with her.  5) Multinodular goiter : -She is documented to have multinodular goiter. Thyroid ultrasound in 8/22   Right lobe: 3.8 x 1.8 x 1.5 cm,  Left lobe: 4.4 x 2.5 x 1.3 cm  IMPRESSION: Solitary left thyroid nodule demonstrates minimal growth since 2013 which indicates a benign etiology. Given the location of this nodule, it could also represent a parathyroid adenoma.   6) vitamin D deficiency  -She is currently on vitamin D supplement, D3 5000 units daily .  She recently had normal ABIs.  This study  will be repeated in May 2027, or sooner if needed.   - I advised patient to maintain close follow up with Monico Blitz, MD for primary care needs.   I spent 31 minutes in the care of the patient today including review of labs from Thyroid Function, CMP, and other relevant labs ; imaging/biopsy records (current and previous including abstractions from other facilities); face-to-face time discussing  her lab results and symptoms, medications doses, her options of short and long term treatment based on the latest standards of care / guidelines;   and documenting the encounter.  Dana Lawson  participated in the discussions, expressed understanding, and voiced agreement with the above plans.  All questions were answered to her satisfaction. she is encouraged to contact clinic should she have any questions or concerns prior to her return visit.   Follow up plan: Return in about 4 months (around 01/18/2022) for F/U with Meter/CGM /Logs Only - no Labs, A1c -NV.  Glade Lloyd, MD Phone: [phone]  Fax: [phone]  This note was partially dictated with voice recognition software. Similar sounding words can be transcribed inadequately  or may not  be corrected upon review.  09/18/2021, 3:16 PM

## 2021-10-19 DIAGNOSIS — Z23 Encounter for immunization: Secondary | ICD-10-CM | POA: Diagnosis not present

## 2021-10-31 ENCOUNTER — Other Ambulatory Visit: Payer: Self-pay | Admitting: "Endocrinology

## 2021-10-31 DIAGNOSIS — E038 Other specified hypothyroidism: Secondary | ICD-10-CM

## 2021-10-31 NOTE — Telephone Encounter (Signed)
F/u     1. Which medications need to be refilled? (please list name of each medication and dose if known) SYNTHROID 150 MCG tablet  2. Which pharmacy/location (including street and city if local pharmacy) is medication to be sent to? [facility]   3. Do they need a 30 day or 90 day supply? 90 day supply

## 2021-12-14 DIAGNOSIS — E1165 Type 2 diabetes mellitus with hyperglycemia: Secondary | ICD-10-CM | POA: Diagnosis not present

## 2021-12-14 DIAGNOSIS — N390 Urinary tract infection, site not specified: Secondary | ICD-10-CM | POA: Diagnosis not present

## 2021-12-14 DIAGNOSIS — I10 Essential (primary) hypertension: Secondary | ICD-10-CM | POA: Diagnosis not present

## 2021-12-14 DIAGNOSIS — F332 Major depressive disorder, recurrent severe without psychotic features: Secondary | ICD-10-CM | POA: Diagnosis not present

## 2021-12-14 DIAGNOSIS — M549 Dorsalgia, unspecified: Secondary | ICD-10-CM | POA: Diagnosis not present

## 2021-12-14 DIAGNOSIS — Z299 Encounter for prophylactic measures, unspecified: Secondary | ICD-10-CM | POA: Diagnosis not present

## 2022-01-04 DIAGNOSIS — Z79899 Other long term (current) drug therapy: Secondary | ICD-10-CM | POA: Diagnosis not present

## 2022-01-04 DIAGNOSIS — I10 Essential (primary) hypertension: Secondary | ICD-10-CM | POA: Diagnosis not present

## 2022-01-04 DIAGNOSIS — E7800 Pure hypercholesterolemia, unspecified: Secondary | ICD-10-CM | POA: Diagnosis not present

## 2022-01-04 DIAGNOSIS — R5383 Other fatigue: Secondary | ICD-10-CM | POA: Diagnosis not present

## 2022-01-04 DIAGNOSIS — Z1331 Encounter for screening for depression: Secondary | ICD-10-CM | POA: Diagnosis not present

## 2022-01-04 DIAGNOSIS — Z6835 Body mass index (BMI) 35.0-35.9, adult: Secondary | ICD-10-CM | POA: Diagnosis not present

## 2022-01-04 DIAGNOSIS — Z0000 Encounter for general adult medical examination without abnormal findings: Secondary | ICD-10-CM | POA: Diagnosis not present

## 2022-01-04 DIAGNOSIS — Z1339 Encounter for screening examination for other mental health and behavioral disorders: Secondary | ICD-10-CM | POA: Diagnosis not present

## 2022-01-04 DIAGNOSIS — Z299 Encounter for prophylactic measures, unspecified: Secondary | ICD-10-CM | POA: Diagnosis not present

## 2022-01-04 DIAGNOSIS — Z7189 Other specified counseling: Secondary | ICD-10-CM | POA: Diagnosis not present

## 2022-01-12 ENCOUNTER — Other Ambulatory Visit: Payer: Self-pay

## 2022-01-12 DIAGNOSIS — E118 Type 2 diabetes mellitus with unspecified complications: Secondary | ICD-10-CM

## 2022-01-12 MED ORDER — TRULICITY 1.5 MG/0.5ML ~~LOC~~ SOAJ: 1.5000 mg | SUBCUTANEOUS | 0 refills | Status: DC

## 2022-01-15 ENCOUNTER — Other Ambulatory Visit: Payer: Self-pay

## 2022-01-15 ENCOUNTER — Telehealth: Payer: Self-pay | Admitting: "Endocrinology

## 2022-01-15 DIAGNOSIS — E118 Type 2 diabetes mellitus with unspecified complications: Secondary | ICD-10-CM

## 2022-01-15 MED ORDER — TRULICITY 1.5 MG/0.5ML ~~LOC~~ SOAJ: 1.5000 mg | SUBCUTANEOUS | 0 refills | Status: DC

## 2022-01-15 NOTE — Telephone Encounter (Signed)
error 

## 2022-01-18 ENCOUNTER — Ambulatory Visit: Payer: Medicare PPO | Admitting: "Endocrinology

## 2022-02-08 ENCOUNTER — Ambulatory Visit (INDEPENDENT_AMBULATORY_CARE_PROVIDER_SITE_OTHER): Payer: Medicare PPO | Admitting: "Endocrinology

## 2022-02-08 ENCOUNTER — Encounter: Payer: Self-pay | Admitting: "Endocrinology

## 2022-02-08 VITALS — BP 124/72 | HR 91 | Ht 67.5 in | Wt 223.0 lb

## 2022-02-08 DIAGNOSIS — E038 Other specified hypothyroidism: Secondary | ICD-10-CM

## 2022-02-08 DIAGNOSIS — I10 Essential (primary) hypertension: Secondary | ICD-10-CM | POA: Diagnosis not present

## 2022-02-08 DIAGNOSIS — E118 Type 2 diabetes mellitus with unspecified complications: Secondary | ICD-10-CM | POA: Diagnosis not present

## 2022-02-08 DIAGNOSIS — E782 Mixed hyperlipidemia: Secondary | ICD-10-CM | POA: Diagnosis not present

## 2022-02-08 LAB — POCT GLYCOSYLATED HEMOGLOBIN (HGB A1C): Hemoglobin A1C: 7 % — AB (ref 4.0–5.6)

## 2022-02-08 MED ORDER — TRULICITY 1.5 MG/0.5ML ~~LOC~~ SOAJ: 1.5000 mg | SUBCUTANEOUS | 1 refills | Status: DC

## 2022-02-08 NOTE — Progress Notes (Signed)
02/08/2022   Endocrinology follow-up note   Subjective:    Patient ID: Dana Lawson, female    DOB: 10/02/1945, PCP Monico Blitz, MD   Past Medical History:  Diagnosis Date   Anxiety    COPD (chronic obstructive pulmonary disease) ([place])    Depression    Essential hypertension    GERD (gastroesophageal reflux disease)    Hepatic steatosis    Hyperlipidemia    Hypothyroidism    Type 2 diabetes mellitus ([place])    Past Surgical History:  Procedure Laterality Date   LEFT HEART CATH AND CORONARY ANGIOGRAPHY N/A 11/08/2016   Procedure: LEFT HEART CATH AND CORONARY ANGIOGRAPHY;  Surgeon: Leonie Man, MD;  Location: [facility];  Service: Cardiovascular;  Laterality: N/A;   TUBAL LIGATION     Social History   Socioeconomic History   Marital status: Married    Spouse name: Not on file   Number of children: Not on file   Years of education: Not on file   Highest education level: Not on file  Occupational History   Not on file  Tobacco Use   Smoking status: Former    Types: Cigarettes    Quit date: 11/06/2015    Years since quitting: 6.2   Smokeless tobacco: Never  Vaping Use   Vaping Use: Never used  Substance and Sexual Activity   Alcohol use: Yes    Alcohol/week: 0.0 standard drinks of alcohol    Comment: Occasional   Drug use: No   Sexual activity: Not on file  Other Topics Concern   Not on file  Social History Narrative   Not on file   Social Determinants of Health   Financial Resource Strain: Not on file  Food Insecurity: Not on file  Transportation Needs: Not on file  Physical Activity: Not on file  Stress: Not on file  Social Connections: Not on file   Outpatient Encounter Medications as of 02/08/2022  Medication Sig   aspirin 81 MG tablet Take 81 mg by mouth 3 (three) times a week.    benazepril (LOTENSIN) 40 MG tablet Take 40 mg by mouth daily.   Blood Glucose Monitoring Suppl (FREESTYLE LITE) DEVI 1 each by Does not apply route 2 (two)  times daily. Use as instructed to check blood glucose two times a day.   buPROPion (WELLBUTRIN SR) 150 MG 12 hr tablet Take 150 mg by mouth 2 (two) times daily.   chlorhexidine (PERIDEX) 0.12 % solution Use as directed 5 mLs in the mouth or throat 2 (two) times a week.    Cholecalciferol (VITAMIN D3) 125 MCG (5000 UT) CAPS Take 1 capsule (5,000 Units total) by mouth daily. (Patient taking differently: Take 5,000 Units by mouth 2 (two) times a week. )   clonazePAM (KLONOPIN) 0.5 MG tablet Take 0.5 mg by mouth at bedtime.    Coenzyme Q10 (CO Q 10) 100 MG CAPS Take 100 mg by mouth daily.   Dulaglutide (TRULICITY) 1.5 HC/6.2BJ SOPN Inject 1.5 mg into the skin once a week.   empagliflozin (JARDIANCE) 10 MG TABS tablet Take 1 tablet (10 mg total) by mouth daily before breakfast.   fenofibrate (TRICOR) 48 MG tablet TAKE 1 TABLET DAILY   furosemide (LASIX) 40 MG tablet Take 40 mg by mouth daily.    glipiZIDE (GLIPIZIDE XL) 5 MG 24 hr tablet Take 1 tablet (5 mg total) by mouth daily with breakfast.   glucose blood (ACCU-CHEK GUIDE) test strip Use as instructed  glucose blood test strip 1 each by Other route 2 (two) times daily. Use as instructed bid. E11.65   Lancets (FREESTYLE) lancets Use as instructed to check blood glucose twice daily   levothyroxine (SYNTHROID) 150 MCG tablet TAKE 1 TABLET DAILY BEFORE BREAKFAST   meloxicam (MOBIC) 15 MG tablet Take 15 mg by mouth daily.   omeprazole (PRILOSEC) 20 MG capsule Take 20 mg by mouth daily.   potassium chloride (K-DUR) 10 MEQ tablet Take 10 mEq by mouth daily.   Probiotic Product (PROBIOTIC DAILY PO) Take 1 tablet by mouth daily. Woman's   rosuvastatin (CRESTOR) 5 MG tablet Take 5 mg by mouth daily.   [DISCONTINUED] Dulaglutide (TRULICITY) 1.5 ZO/1.0RU SOPN Inject 1.5 mg into the skin once a week.   [DISCONTINUED] metFORMIN (GLUCOPHAGE) 500 MG tablet Take 1 tablet (500 mg total) by mouth 2 (two) times daily with a meal. (Patient not taking: Reported on  09/18/2021)   No facility-administered encounter medications on file as of 02/08/2022.   ALLERGIES: Allergies  Allergen Reactions   Flagyl [Metronidazole] Other (See Comments)    Shortness of breath   VACCINATION STATUS: Immunization History  Administered Date(s) Administered   Moderna Sars-Covid-2 Vaccination 03/14/2019, 04/15/2019    Diabetes She presents for her follow-up diabetic visit. She has type 2 diabetes mellitus. Onset time: She was diagnosed at approximate age of 24 years. Her disease course has been stable. There are no hypoglycemic associated symptoms. There are no diabetic associated symptoms. There are no hypoglycemic complications. Symptoms are improving. There are no diabetic complications. Risk factors for coronary artery disease include diabetes mellitus, dyslipidemia, hypertension, obesity, sedentary lifestyle, post-menopausal and tobacco exposure. Current diabetic treatment includes oral agent (monotherapy). Her weight is decreasing steadily. She is following a generally unhealthy diet. When asked about meal planning, she reported none. She has not had a previous visit with a dietitian. She rarely participates in exercise. Her home blood glucose trend is decreasing steadily. Her breakfast blood glucose range is generally 130-140 mg/dl. Her overall blood glucose range is 130-140 mg/dl. (She presents with near target glucose profile, POC a1c of 6.7%, overall improving.  She did not document any hypoglycemia.   ) An ACE inhibitor/angiotensin II receptor blocker is being taken. She does not see a podiatrist.Eye exam is not current.   Review of Systems    Review of systems  Constitutional: + Minimally fluctuating body weight,  current  Body mass index is 34.41 kg/m. , no fatigue, no subjective hyperthermia, no subjective hypothermia    Objective:    BP 124/72   Pulse 91   Ht 5' 7.5" (1.715 m)   Wt 223 lb (101.2 kg)   BMI 34.41 kg/m   Wt Readings from Last 3  Encounters:  02/08/22 223 lb (101.2 kg)  09/18/21 219 lb 6.4 oz (99.5 kg)  03/15/21 227 lb (103 kg)    Physical Exam  Physical Exam- Limited  Constitutional:  Body mass index is 34.41 kg/m. , not in acute distress, normal state of mind    Recent Results (from the past 2160 hour(s))  HgB A1c     Status: Abnormal   Collection Time: 02/08/22  1:46 PM  Result Value Ref Range   Hemoglobin A1C 7.0 (A) 4.0 - 5.6 %   HbA1c POC (<> result, manual entry)     HbA1c, POC (prediabetic range)     HbA1c, POC (controlled diabetic range)     Lipid Panel     Component Value Date/Time   CHOL  177 09/12/2021 1432   TRIG 340 (H) 09/12/2021 1432   HDL 42 (L) 09/12/2021 1432   CHOLHDL 4.2 09/12/2021 1432   LDLCALC 90 09/12/2021 1432     Assessment & Plan:   1. Hypothyroidism -Her thyroid function tests are c/w  appropriate replacement.  She is advised to continue levothyroxine 150 mcg p.o. daily before breakfast.   - We discussed about the correct intake of her thyroid hormone, on empty stomach at fasting, with water, separated by at least 30 minutes from breakfast and other medications,  and separated by more than 4 hours from calcium, iron, multivitamins, acid reflux medications (PPIs). -Patient is made aware of the fact that thyroid hormone replacement is needed for life, dose to be adjusted by periodic monitoring of thyroid function tests.  2.  Type 2 diabetes-    She presents with POC A1c of 7%.  She did not document any hypoglycemia.      She is benefiting from [employer].  She is advised to continue  Trulicity 1.5 mg subcutaneously weekly.  She is also on glipizide 5 mg XL p.o. daily at breakfast. She is advised to continue  Jardiance to 10 mg p.o. nightly.  She is taken off of metformin.   - she acknowledges that there is a room for improvement in her food and drink choices. - Suggestion is made for her to avoid simple carbohydrates  from her diet including Cakes, Sweet Desserts,  Ice Cream, Soda (diet and regular), Sweet Tea, Candies, Chips, Cookies, Store Bought Juices, Alcohol in Excess of  1-2 drinks a day, Artificial Sweeteners,  Coffee Creamer, and "Sugar-free" Products, Lemonade. This will help patient to have more stable blood glucose profile and potentially avoid unintended weight gain.  3) hypertension:  -Her BP is controlled to target.  Her urine microalbumin is normal today.  She is currently on benazepril 40 mg/day, will benefit from adding a diuretic.  She has benefited from addition of hydrochlorothiazide 25 mg p.o. daily at breakfast.  She remains on benazepril 40 mg p.o. daily at breakfast as well.     4) hyperlipidemia:  Her triglycerides are improving after she was started on fenofibrate.   Her LDL is uncontrolled at 90. She is advised to continue fenofibrate 48 mg p.o. daily along with her Crestor 20 mg p.o. daily at bedtime.    Side effects and precautions discussed with her.  5) Multinodular goiter : -She is documented to have multinodular goiter. Thyroid ultrasound in 8/22   Right lobe: 3.8 x 1.8 x 1.5 cm,  Left lobe: 4.4 x 2.5 x 1.3 cm  IMPRESSION: Solitary left thyroid nodule demonstrates minimal growth since 2013 which indicates a benign etiology. Given the location of this nodule, it could also represent a parathyroid adenoma.   6) vitamin D deficiency  -She is currently on vitamin D supplement, D3 5000 units daily .  She recently had normal ABIs.  This study will be repeated in May 2027, or sooner if needed.   - I advised patient to maintain close follow up with Monico Blitz, MD for primary care needs.   I spent  23 minutes in the care of the patient today including review of labs from Thyroid Function, CMP, and other relevant labs ; imaging/biopsy records (current and previous including abstractions from other facilities); face-to-face time discussing  her lab results and symptoms, medications doses, her options of short and long term  treatment based on the latest standards of care / guidelines;  and documenting the encounter.  Glade Lloyd  participated in the discussions, expressed understanding, and voiced agreement with the above plans.  All questions were answered to her satisfaction. she is encouraged to contact clinic should she have any questions or concerns prior to her return visit.    Follow up plan: Return in about 6 months (around 08/09/2022) for F/U with Pre-visit Labs, Meter/CGM/Logs, A1c here.  Marquis Lunch, MD Phone: [phone]  Fax: [phone]  This note was partially dictated with voice recognition software. Similar sounding words can be transcribed inadequately or may not  be corrected upon review.  02/08/2022, 2:54 PM

## 2022-02-08 NOTE — Patient Instructions (Signed)

## 2022-02-13 ENCOUNTER — Telehealth: Payer: Self-pay

## 2022-02-13 NOTE — Telephone Encounter (Signed)
Left a message requesting pt to return call to the office. 

## 2022-02-15 ENCOUNTER — Other Ambulatory Visit: Payer: Self-pay | Admitting: "Endocrinology

## 2022-02-15 ENCOUNTER — Telehealth: Payer: Self-pay

## 2022-02-15 MED ORDER — OZEMPIC (0.25 OR 0.5 MG/DOSE) 2 MG/3ML ~~LOC~~ SOPN: 0.2500 mg | PEN_INJECTOR | SUBCUTANEOUS | 1 refills | Status: DC

## 2022-02-15 NOTE — Telephone Encounter (Signed)
Tried to call pt, did not receive an answer and was unable to leave a message. 

## 2022-02-15 NOTE — Telephone Encounter (Signed)
Pt called stating she has been unable to get her trulicity. [facility] has told her it is on backorder from [facility]. Pt asked if you would prescribe her a different medication to take it's place.

## 2022-02-15 NOTE — Telephone Encounter (Signed)
Pt made aware

## 2022-02-26 ENCOUNTER — Telehealth: Payer: Self-pay

## 2022-02-26 NOTE — Telephone Encounter (Signed)
Pt needs a prior authorization for Ozempic.

## 2022-03-13 ENCOUNTER — Other Ambulatory Visit (HOSPITAL_COMMUNITY): Payer: Self-pay

## 2022-03-13 ENCOUNTER — Telehealth: Payer: Self-pay | Admitting: Pharmacy Technician

## 2022-03-13 NOTE — Telephone Encounter (Signed)
Pharmacy Patient Advocate Encounter   Received notification from Pt calls msgs/LPN that prior authorization for Ozempic 0.'25mg'$  or 0.'5mg'$  is required/requested.   PA submitted on 03/13/22 to (ins) [facility] via [employer]eds [employer] or [facility]) confirmation # [id number]  Prior Authorization for has been approved.  PA # [id number] Effective dates: 02/11/22 through 01/07/98  Pt can call [place] delivery to fill.

## 2022-03-13 NOTE — Telephone Encounter (Signed)
PA has been APPROVED until further notice under [employer].

## 2022-03-13 NOTE — Telephone Encounter (Signed)
Thank you :)

## 2022-03-13 NOTE — Telephone Encounter (Signed)
Any update on PA for ozempic?

## 2022-03-23 ENCOUNTER — Other Ambulatory Visit: Payer: Self-pay | Admitting: Internal Medicine

## 2022-03-23 DIAGNOSIS — Z1231 Encounter for screening mammogram for malignant neoplasm of breast: Secondary | ICD-10-CM

## 2022-03-27 ENCOUNTER — Telehealth: Payer: Self-pay | Admitting: "Endocrinology

## 2022-03-27 MED ORDER — OZEMPIC (0.25 OR 0.5 MG/DOSE) 2 MG/3ML ~~LOC~~ SOPN: 0.2500 mg | PEN_INJECTOR | SUBCUTANEOUS | 0 refills | Status: DC

## 2022-03-27 NOTE — Telephone Encounter (Signed)
90 day supply sent to [facility] for [facility]empic

## 2022-04-03 ENCOUNTER — Ambulatory Visit
Admission: RE | Admit: 2022-04-03 | Discharge: 2022-04-03 | Disposition: A | Discharge status: Home / Self Care | Payer: Medicare PPO | Source: Ambulatory Visit | Attending: Internal Medicine | Admitting: Internal Medicine

## 2022-04-03 DIAGNOSIS — Z1231 Encounter for screening mammogram for malignant neoplasm of breast: Secondary | ICD-10-CM

## 2022-07-11 ENCOUNTER — Other Ambulatory Visit: Payer: Self-pay | Admitting: "Endocrinology

## 2022-07-31 DIAGNOSIS — M5386 Other specified dorsopathies, lumbar region: Secondary | ICD-10-CM | POA: Diagnosis not present

## 2022-07-31 DIAGNOSIS — I10 Essential (primary) hypertension: Secondary | ICD-10-CM | POA: Diagnosis not present

## 2022-07-31 DIAGNOSIS — Z299 Encounter for prophylactic measures, unspecified: Secondary | ICD-10-CM | POA: Diagnosis not present

## 2022-08-01 DIAGNOSIS — E118 Type 2 diabetes mellitus with unspecified complications: Secondary | ICD-10-CM | POA: Diagnosis not present

## 2022-08-01 DIAGNOSIS — E038 Other specified hypothyroidism: Secondary | ICD-10-CM | POA: Diagnosis not present

## 2022-08-02 LAB — COMPREHENSIVE METABOLIC PANEL
ALT: 17 IU/L (ref 0–32)
AST: 22 IU/L (ref 0–40)
Albumin: 4.7 g/dL (ref 3.8–4.8)
Alkaline Phosphatase: 120 IU/L (ref 44–121)
BUN/Creatinine Ratio: 15 (ref 12–28)
BUN: 20 mg/dL (ref 8–27)
Bilirubin Total: 0.4 mg/dL (ref 0.0–1.2)
CO2: 25 mmol/L (ref 20–29)
Calcium: 10.3 mg/dL (ref 8.7–10.3)
Chloride: 98 mmol/L (ref 96–106)
Creatinine, Ser: 1.33 mg/dL — ABNORMAL HIGH (ref 0.57–1.00)
Globulin, Total: 2.7 g/dL (ref 1.5–4.5)
Glucose: 167 mg/dL — ABNORMAL HIGH (ref 70–99)
Potassium: 5 mmol/L (ref 3.5–5.2)
Sodium: 138 mmol/L (ref 134–144)
Total Protein: 7.4 g/dL (ref 6.0–8.5)
eGFR: 42 mL/min/{1.73_m2} — ABNORMAL LOW (ref >59)

## 2022-08-02 LAB — LIPID PANEL
Chol/HDL Ratio: 3.6 ratio (ref 0.0–4.4)
Cholesterol, Total: 164 mg/dL (ref 100–199)
HDL: 45 mg/dL (ref >39)
LDL Chol Calc (NIH): 62 mg/dL (ref 0–99)
VLDL Cholesterol Cal: 57 mg/dL — ABNORMAL HIGH (ref 5–40)

## 2022-08-02 LAB — TSH: TSH: 1.61 u[IU]/mL (ref 0.450–4.500)

## 2022-08-04 IMAGING — US US THYROID
1 series · 13 of 25 positions shown · non-contrast
Comparison: 03/12/2017

12/08/2013

CLINICAL DATA: Follow-up multinodular goiter

EXAM:
THYROID ULTRASOUND
TECHNIQUE: Ultrasound examination of the thyroid gland and adjacent soft
tissues was performed.

[Series 1: us thyroid · 13 of 54 slices shown]
[im 1/54]
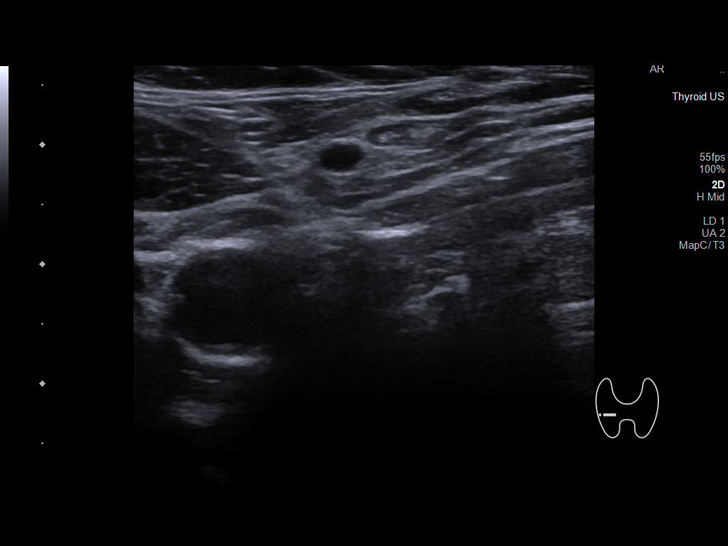
[im 5/54]
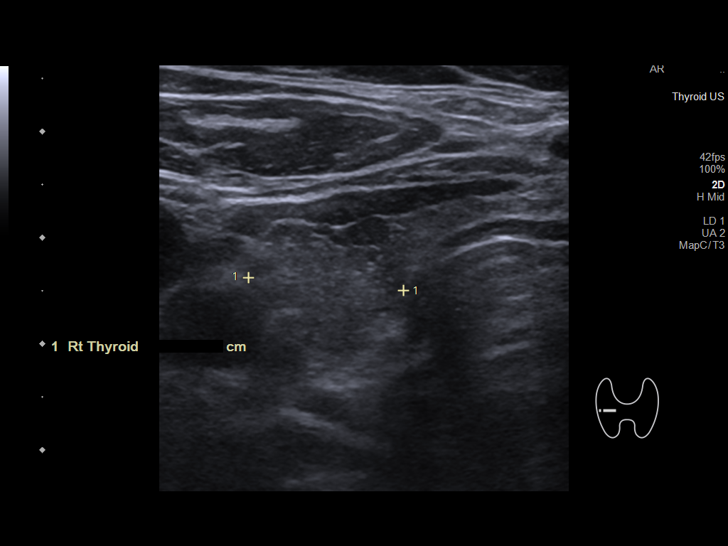
[im 9/54]
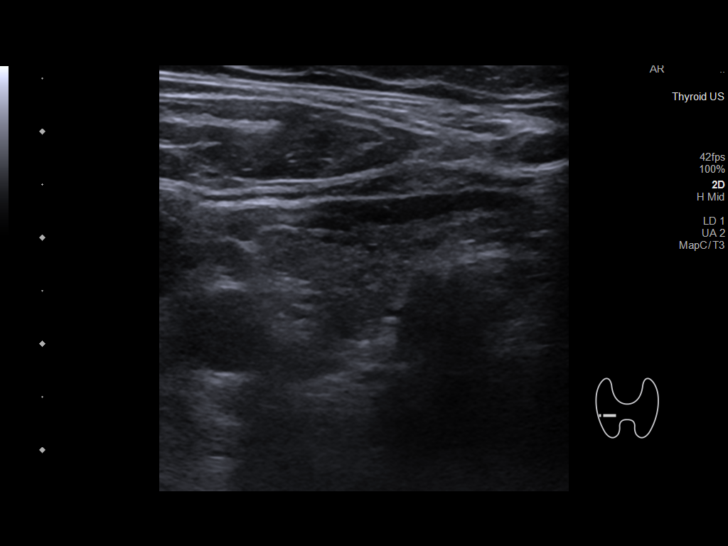
[im 14/54]
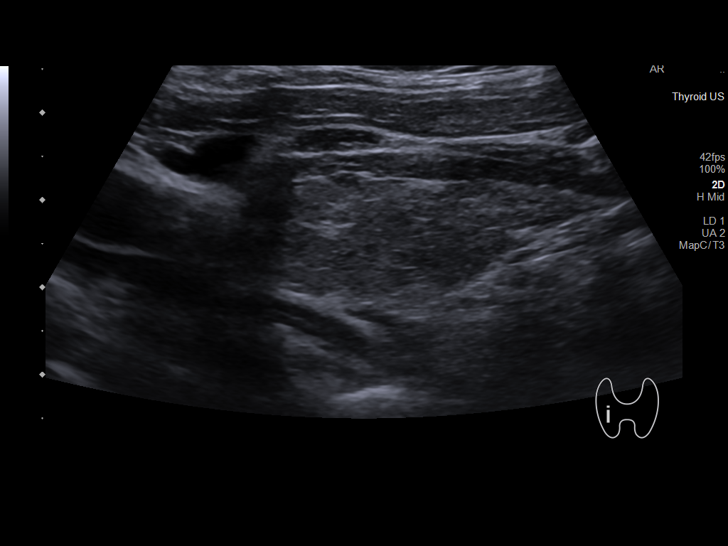
[im 18/54]
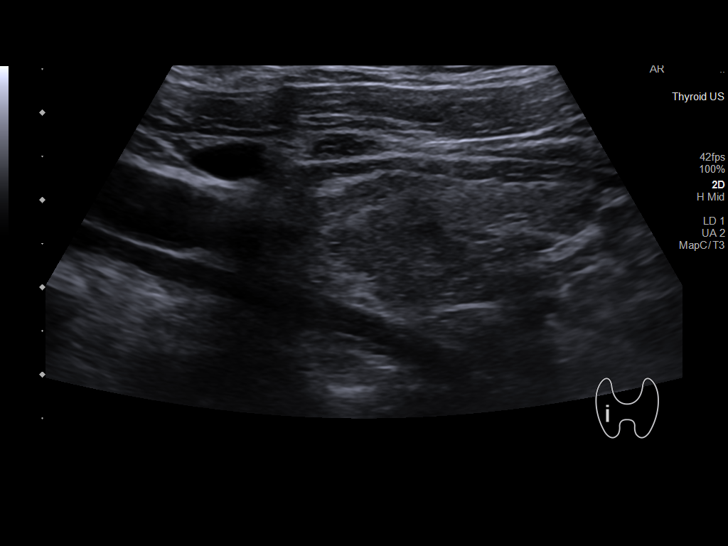
[im 23/54]
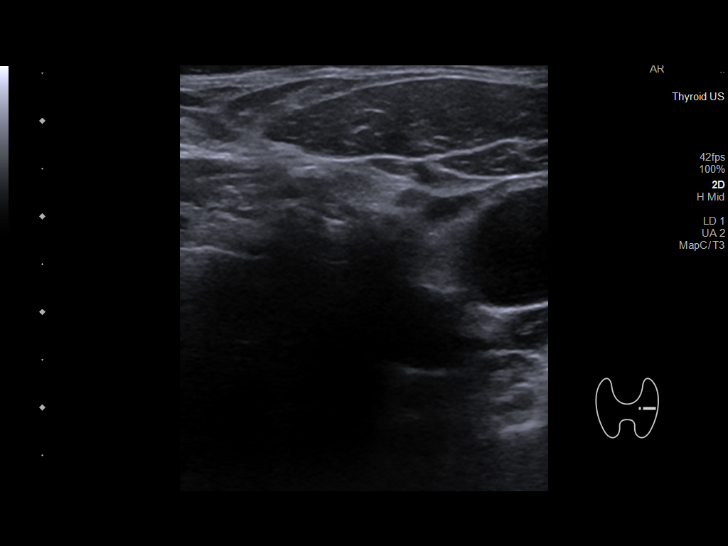
[im 27/54]
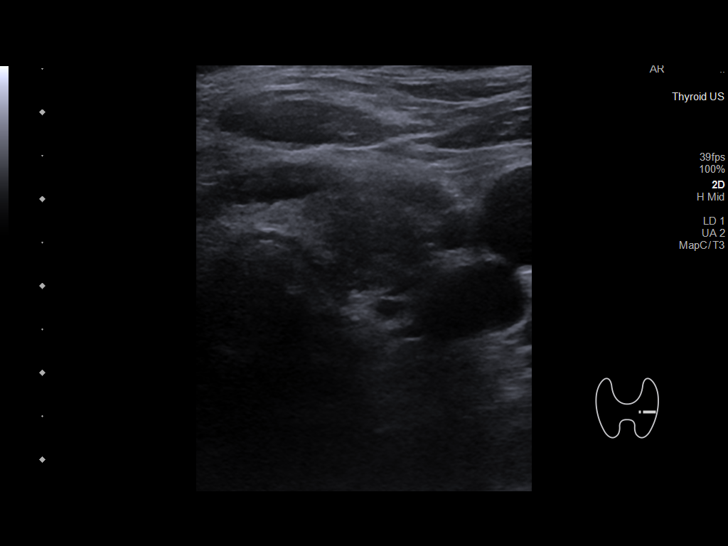
[im 31/54]
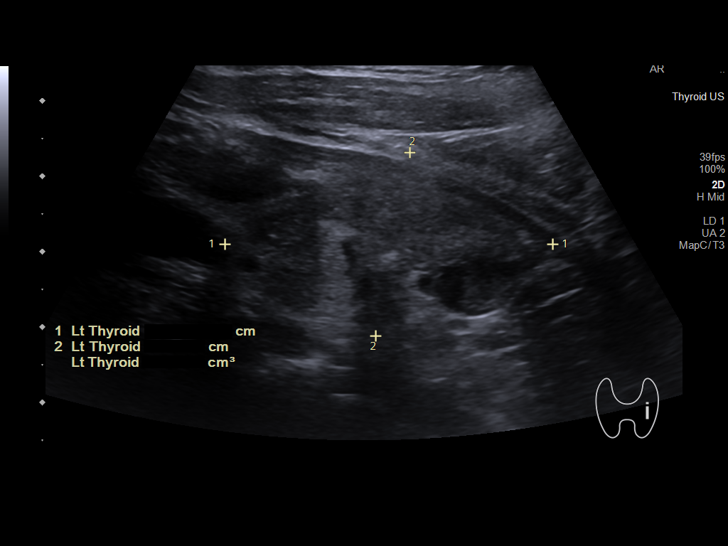
[im 36/54]
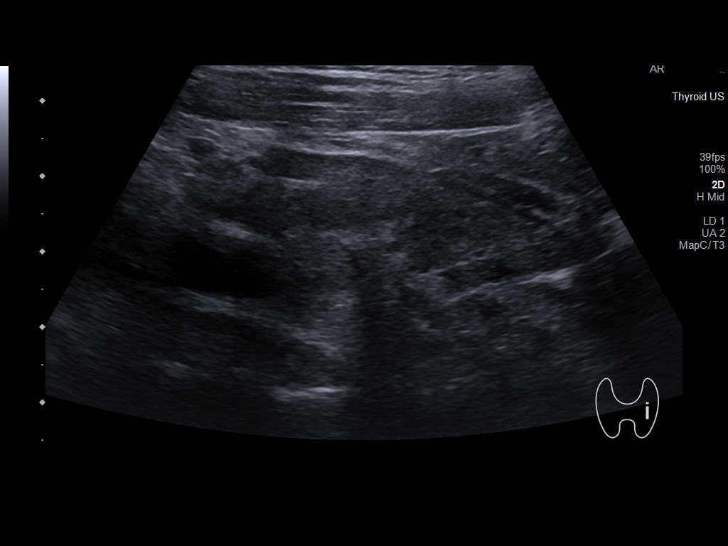
[im 40/54]
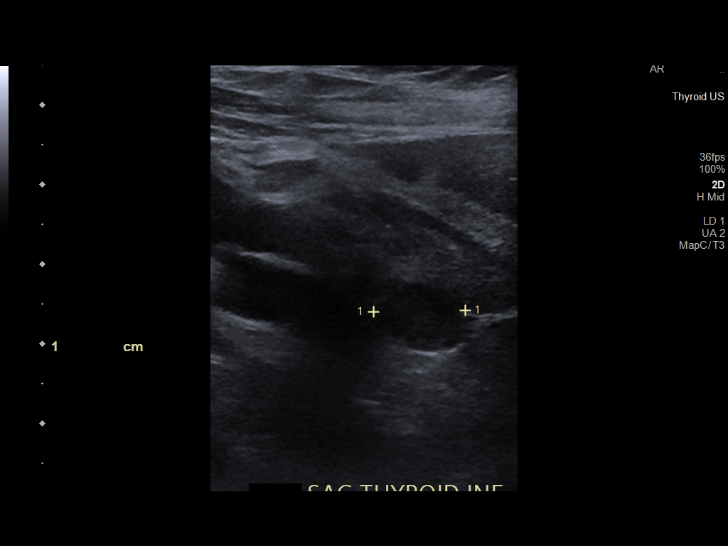
[im 45/54]
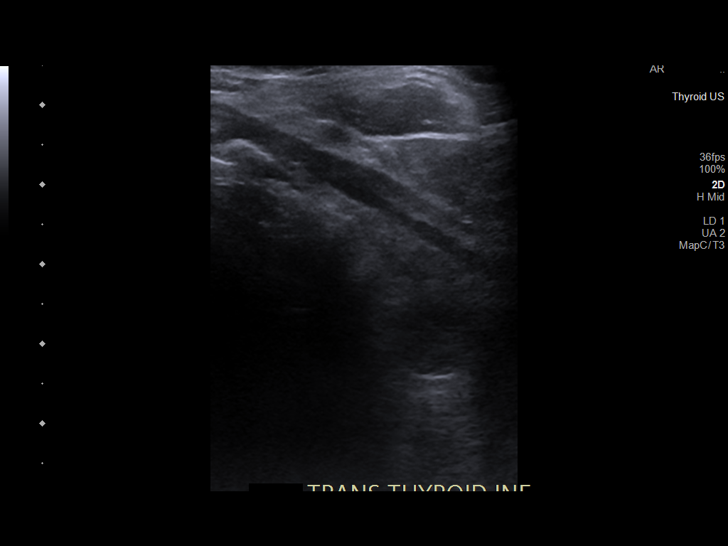
[im 49/54]
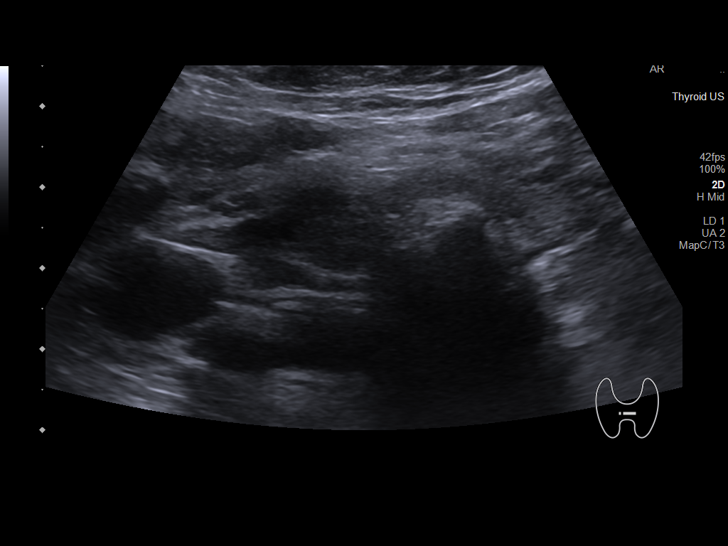
[im 54/54]
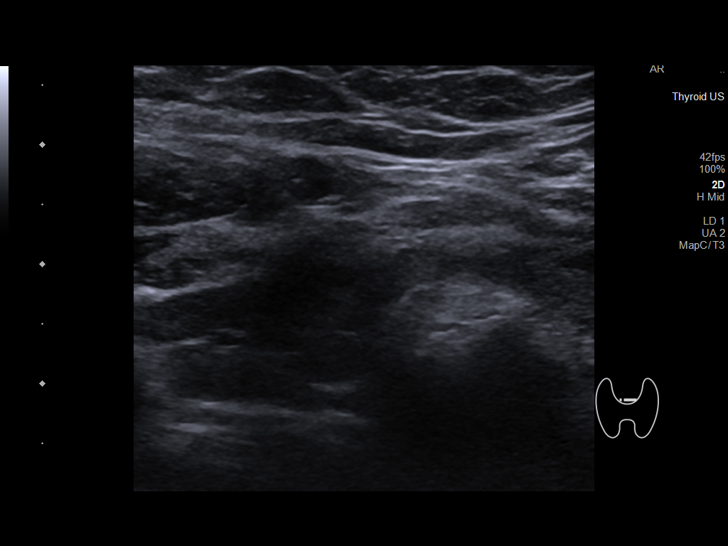

[13 of 25 positions shown; findings below may reference images not displayed]

FINDINGS: Parenchymal Echotexture: Markedly heterogeneous

Isthmus: 0.3 cm

Right lobe: 3.8 x 1.8 x 1.5 cm

Left lobe: 4.4 x 2.5 x 1.3 cm

_________________________________________________________

Estimated total number of nodules >/= 1 cm: 1

Number of spongiform nodules >/=  2 cm not described below (TR1): 0

Number of mixed cystic and solid nodules >/= 1.5 cm not described
below (TR2): 0

_________________________________________________________

Nodule # 1:

Prior biopsy: No

Location: Left; inferior

Maximum size: 1.2 cm; Other 2 dimensions: 1.2 x 1.2 cm, previously,
1.2 x 0.9 x 0.9 cm on 07/02/2011

Composition: solid/almost completely solid (2)

Echogenicity: hypoechoic (2)

Shape: not taller-than-wide (0)

Margins: smooth (0)

Echogenic foci: none (0)

ACR TI-RADS total points: 4.

ACR TI-RADS risk category:  TR4 (4-6 points).

Significant change in size (>/= 20% in two dimensions and minimal
increase of 2 mm): Yes

Change in features: No

Change in ACR TI-RADS risk category: No

ACR TI-RADS recommendations:

Given very minimal growth since 07/02/2011, findings are likely
benign.

_________________________________________________________
IMPRESSION: Solitary left thyroid nodule demonstrates minimal growth since 8889
which indicates a benign etiology. Given the location of this
nodule, it could also represent a parathyroid adenoma. Please
correlate with patient's symptoms and laboratory workup.

The above is in keeping with the ACR TI-RADS recommendations - [HOSPITAL] 5214;[DATE].

## 2022-08-09 ENCOUNTER — Encounter: Payer: Self-pay | Admitting: "Endocrinology

## 2022-08-09 ENCOUNTER — Ambulatory Visit (INDEPENDENT_AMBULATORY_CARE_PROVIDER_SITE_OTHER): Payer: Medicare PPO | Admitting: "Endocrinology

## 2022-08-09 VITALS — BP 118/66 | HR 52 | Ht 67.5 in | Wt 218.0 lb

## 2022-08-09 DIAGNOSIS — Z7984 Long term (current) use of oral hypoglycemic drugs: Secondary | ICD-10-CM

## 2022-08-09 DIAGNOSIS — E118 Type 2 diabetes mellitus with unspecified complications: Secondary | ICD-10-CM | POA: Diagnosis not present

## 2022-08-09 DIAGNOSIS — E038 Other specified hypothyroidism: Secondary | ICD-10-CM

## 2022-08-09 DIAGNOSIS — E782 Mixed hyperlipidemia: Secondary | ICD-10-CM | POA: Diagnosis not present

## 2022-08-09 DIAGNOSIS — I10 Essential (primary) hypertension: Secondary | ICD-10-CM

## 2022-08-09 LAB — POCT GLYCOSYLATED HEMOGLOBIN (HGB A1C)

## 2022-08-09 MED ORDER — SEMAGLUTIDE (1 MG/DOSE) 4 MG/3ML ~~LOC~~ SOPN: 1.0000 mg | PEN_INJECTOR | SUBCUTANEOUS | 2 refills | Status: DC

## 2022-08-09 MED ORDER — EMPAGLIFLOZIN 25 MG PO TABS: 25.0000 mg | ORAL_TABLET | Freq: Every day | ORAL | 1 refills | Status: AC

## 2022-08-09 NOTE — Progress Notes (Signed)
08/09/2022   Endocrinology follow-up note   Subjective:    Patient ID: Dana Lawson, female    DOB: 07-Jun-1945, PCP Kirstie Peri, MD   Past Medical History:  Diagnosis Date   Anxiety    COPD (chronic obstructive pulmonary disease) (HCC)    Depression    Essential hypertension    GERD (gastroesophageal reflux disease)    Hepatic steatosis    Hyperlipidemia    Hypothyroidism    Type 2 diabetes mellitus (HCC)    Past Surgical History:  Procedure Laterality Date   BREAST BIOPSY Left    per pt benign   LEFT HEART CATH AND CORONARY ANGIOGRAPHY N/A 11/08/2016   Procedure: LEFT HEART CATH AND CORONARY ANGIOGRAPHY;  Surgeon: Marykay Lex, MD;  Location: [facility] INVASIVE CV LAB;  Service: Cardiovascular;  Laterality: N/A;   TUBAL LIGATION     Social History   Socioeconomic History   Marital status: Married    Spouse name: Not on file   Number of children: Not on file   Years of education: Not on file   Highest education level: Not on file  Occupational History   Not on file  Tobacco Use   Smoking status: Former    Current packs/day: 0.00    Types: Cigarettes    Quit date: 11/06/2015    Years since quitting: 6.7   Smokeless tobacco: Never  Vaping Use   Vaping status: Never Used  Substance and Sexual Activity   Alcohol use: Yes    Alcohol/week: 0.0 standard drinks of alcohol    Comment: Occasional   Drug use: No   Sexual activity: Not on file  Other Topics Concern   Not on file  Social History Narrative   Not on file   Social Determinants of Health   Financial Resource Strain: Not on file  Food Insecurity: Not on file  Transportation Needs: Not on file  Physical Activity: Not on file  Stress: Not on file  Social Connections: Not on file   Outpatient Encounter Medications as of 08/09/2022  Medication Sig   Semaglutide, 1 MG/DOSE, 4 MG/3ML SOPN Inject 1 mg as directed once a week.   aspirin 81 MG tablet Take 81 mg by mouth 3 (three) times a week.     benazepril (LOTENSIN) 40 MG tablet Take 40 mg by mouth daily.   Blood Glucose Monitoring Suppl (FREESTYLE LITE) DEVI 1 each by Does not apply route 2 (two) times daily. Use as instructed to check blood glucose two times a day.   buPROPion (WELLBUTRIN SR) 150 MG 12 hr tablet Take 150 mg by mouth 2 (two) times daily.   chlorhexidine (PERIDEX) 0.12 % solution Use as directed 5 mLs in the mouth or throat 2 (two) times a week.    Cholecalciferol (VITAMIN D3) 125 MCG (5000 UT) CAPS Take 1 capsule (5,000 Units total) by mouth daily. (Patient taking differently: Take 5,000 Units by mouth 2 (two) times a week. )   clonazePAM (KLONOPIN) 0.5 MG tablet Take 0.5 mg by mouth at bedtime.    Coenzyme Q10 (CO Q 10) 100 MG CAPS Take 100 mg by mouth daily.   empagliflozin (JARDIANCE) 25 MG TABS tablet Take 1 tablet (25 mg total) by mouth daily before breakfast.   fenofibrate (TRICOR) 48 MG tablet TAKE 1 TABLET DAILY   furosemide (LASIX) 40 MG tablet Take 40 mg by mouth daily.    glucose blood (ACCU-CHEK GUIDE) test strip Use as instructed   glucose blood test strip  1 each by Other route 2 (two) times daily. Use as instructed bid. E11.65   Lancets (FREESTYLE) lancets Use as instructed to check blood glucose twice daily   levothyroxine (SYNTHROID) 150 MCG tablet TAKE 1 TABLET DAILY BEFORE BREAKFAST   meloxicam (MOBIC) 15 MG tablet Take 15 mg by mouth daily.   omeprazole (PRILOSEC) 20 MG capsule Take 20 mg by mouth daily.   potassium chloride (K-DUR) 10 MEQ tablet Take 10 mEq by mouth daily.   Probiotic Product (PROBIOTIC DAILY PO) Take 1 tablet by mouth daily. Woman's   rosuvastatin (CRESTOR) 5 MG tablet Take 5 mg by mouth daily.   [DISCONTINUED] empagliflozin (JARDIANCE) 10 MG TABS tablet Take 1 tablet (10 mg total) by mouth daily before breakfast.   [DISCONTINUED] glipiZIDE (GLIPIZIDE XL) 5 MG 24 hr tablet Take 1 tablet (5 mg total) by mouth daily with breakfast.   [DISCONTINUED] Semaglutide,0.25 or 0.5MG /DOS,  (OZEMPIC, 0.25 OR 0.5 MG/DOSE,) 2 MG/3ML SOPN INJECT 0.5 MG UNDER THE SKIN WEEKLY   No facility-administered encounter medications on file as of 08/09/2022.   ALLERGIES: Allergies  Allergen Reactions   Flagyl [Metronidazole] Other (See Comments)    Shortness of breath   VACCINATION STATUS: Immunization History  Administered Date(s) Administered   Moderna Sars-Covid-2 Vaccination 03/14/2019, 04/15/2019    Diabetes She presents for her follow-up diabetic visit. She has type 2 diabetes mellitus. Onset time: She was diagnosed at approximate age of 75 years. Her disease course has been fluctuating. There are no hypoglycemic associated symptoms. There are no diabetic associated symptoms. There are no hypoglycemic complications. Symptoms are improving. There are no diabetic complications. Risk factors for coronary artery disease include diabetes mellitus, dyslipidemia, hypertension, obesity, sedentary lifestyle, post-menopausal and tobacco exposure. Current diabetic treatments: She is currently on empiric 0.5 mg subcutaneously weekly, Jardiance 10 mg p.o. daily, glipizide 5 mg p.o. daily. Her weight is fluctuating minimally. She is following a generally unhealthy diet. When asked about meal planning, she reported none. She has not had a previous visit with a dietitian. She rarely participates in exercise. Her home blood glucose trend is decreasing steadily. Her breakfast blood glucose range is generally 130-140 mg/dl. Her overall blood glucose range is 130-140 mg/dl. (She presents with increasing but still near target glucose profile, point-of-care A1c 7.3%.  She did not document any hypoglycemia.   ) An ACE inhibitor/angiotensin II receptor blocker is being taken. She does not see a podiatrist.Eye exam is not current.   Review of Systems    Review of systems  Constitutional: + Minimally fluctuating body weight,  current  Body mass index is 33.64 kg/m. , no fatigue, no subjective hyperthermia, no  subjective hypothermia    Objective:    BP 118/66   Pulse (!) 52   Ht 5' 7.5" (1.715 m)   Wt 218 lb (98.9 kg)   BMI 33.64 kg/m   Wt Readings from Last 3 Encounters:  08/09/22 218 lb (98.9 kg)  02/08/22 223 lb (101.2 kg)  09/18/21 219 lb 6.4 oz (99.5 kg)    Physical Exam  Physical Exam- Limited  Constitutional:  Body mass index is 33.64 kg/m. , not in acute distress, normal state of mind    Recent Results (from the past 2160 hour(s))  Comprehensive metabolic panel     Status: Abnormal   Collection Time: 08/01/22  3:34 PM  Result Value Ref Range   Glucose 167 (H) 70 - 99 mg/dL   BUN 20 8 - 27 mg/dL   Creatinine, Ser  1.33 (H) 0.57 - 1.00 mg/dL   eGFR 42 (L) >60 AV/WUJ/8.11   BUN/Creatinine Ratio 15 12 - 28   Sodium 138 134 - 144 mmol/L   Potassium 5.0 3.5 - 5.2 mmol/L   Chloride 98 96 - 106 mmol/L   CO2 25 20 - 29 mmol/L   Calcium 10.3 8.7 - 10.3 mg/dL   Total Protein 7.4 6.0 - 8.5 g/dL   Albumin 4.7 3.8 - 4.8 g/dL   Globulin, Total 2.7 1.5 - 4.5 g/dL   Bilirubin Total 0.4 0.0 - 1.2 mg/dL   Alkaline Phosphatase 120 44 - 121 IU/L   AST 22 0 - 40 IU/L   ALT 17 0 - 32 IU/L  Lipid panel     Status: Abnormal   Collection Time: 08/01/22  3:34 PM  Result Value Ref Range   Cholesterol, Total 164 100 - 199 mg/dL   Triglycerides 914 (H) 0 - 149 mg/dL   HDL 45 >78 mg/dL   VLDL Cholesterol Cal 57 (H) 5 - 40 mg/dL   LDL Chol Calc (NIH) 62 0 - 99 mg/dL   Chol/HDL Ratio 3.6 0.0 - 4.4 ratio    Comment:                                   T. Chol/HDL Ratio                                             Men  Women                               1/2 Avg.Risk  3.4    3.3                                   Avg.Risk  5.0    4.4                                2X Avg.Risk  9.6    7.1                                3X Avg.Risk 23.4   11.0   TSH     Status: None   Collection Time: 08/01/22  3:34 PM  Result Value Ref Range   TSH 1.610 0.450 - 4.500 uIU/mL  T4, free     Status: None    Collection Time: 08/01/22  3:34 PM  Result Value Ref Range   Free T4 1.51 0.82 - 1.77 ng/dL   Lipid Panel     Component Value Date/Time   CHOL 164 08/01/2022 1534   TRIG 367 (H) 08/01/2022 1534   HDL 45 08/01/2022 1534   CHOLHDL 3.6 08/01/2022 1534   CHOLHDL 4.2 09/12/2021 1432   LDLCALC 62 08/01/2022 1534   LDLCALC 90 09/12/2021 1432   LABVLDL 57 (H) 08/01/2022 1534     Assessment & Plan:   1. Hypothyroidism -Her thyroid function tests are consistent with appropriate replacement.  She is advised to continue levothyroxine 150 mcg p.o. daily before breakfast.   -  We discussed about the correct intake of her thyroid hormone, on empty stomach at fasting, with water, separated by at least 30 minutes from breakfast and other medications,  and separated by more than 4 hours from calcium, iron, multivitamins, acid reflux medications (PPIs). -Patient is made aware of the fact that thyroid hormone replacement is needed for life, dose to be adjusted by periodic monitoring of thyroid function tests.   2.  Type 2 diabetes-    She presents with increasing but still near target glucose profile, point-of-care A1c 7.3%.  She did not document any hypoglycemia.     She is benefiting from GLP-1 receptor agonist.  She is advised to increase Ozempic to 1 mg subcutaneously weekly.  Side effects and precautions discussed with her.  I also discussed and increase her Jardiance to 25 mg p.o. daily at breakfast.  At this point, she is advised to discontinue glipizide.  She will continue to monitor blood glucose twice a day-daily before breakfast and at bedtime.  - she acknowledges that there is a room for improvement in her food and drink choices. - Suggestion is made for her to avoid simple carbohydrates  from her diet including Cakes, Sweet Desserts, Ice Cream, Soda (diet and regular), Sweet Tea, Candies, Chips, Cookies, Store Bought Juices, Alcohol in Excess of  1-2 drinks a day, Artificial Sweeteners,   Coffee Creamer, and "Sugar-free" Products, Lemonade. This will help patient to have more stable blood glucose profile and potentially avoid unintended weight gain.   3) hypertension:  -Her BP is controlled to target.  Her urine microalbumin is normal today.  She is currently on benazepril 40 mg p.o. daily.  She has benefited from addition of hydrochlorothiazide 25 mg p.o. daily at breakfast.  She remains on benazepril 40 mg p.o. daily at breakfast as well.     4) hyperlipidemia:  Her triglycerides are improving after she was started on fenofibrate.  She is improving her LDL to 62.  She is advised to continue Crestor 5 mg p.o. nightly along with fenofibrate 48 mg p.o. daily at bedtime.  Side effects and precautions discussed with her.  5) Multinodular goiter : -She is documented to have multinodular goiter. Thyroid ultrasound in 8/22   Right lobe: 3.8 x 1.8 x 1.5 cm,  Left lobe: 4.4 x 2.5 x 1.3 cm  IMPRESSION: Solitary left thyroid nodule demonstrates minimal growth since 2013 which indicates a benign etiology. Given the location of this nodule, it could also represent a parathyroid adenoma.   6) vitamin D deficiency  -She is currently on vitamin D supplement, D3 5000 units daily .  She recently had normal ABIs.  This study will be repeated in May 2027, or sooner if needed.   - I advised patient to maintain close follow up with Kirstie Peri, MD for primary care needs.   I spent  26  minutes in the care of the patient today including review of labs from CMP, Lipids, Thyroid Function, Hematology (current and previous including abstractions from other facilities); face-to-face time discussing  her blood glucose readings/logs, discussing hypoglycemia and hyperglycemia episodes and symptoms, medications doses, her options of short and long term treatment based on the latest standards of care / guidelines;  discussion about incorporating lifestyle medicine;  and documenting the encounter. Risk  reduction counseling performed per USPSTF guidelines to reduce  obesity and cardiovascular risk factors.     Please refer to Patient Instructions for Blood Glucose Monitoring and Insulin/Medications Dosing Guide"  in media  tab for additional information. Please  also refer to " Patient Self Inventory" in the Media  tab for reviewed elements of pertinent patient history.  Dana Lawson participated in the discussions, expressed understanding, and voiced agreement with the above plans.  All questions were answered to her satisfaction. she is encouraged to contact clinic should she have any questions or concerns prior to her return visit.   Follow up plan: Return in about 6 months (around 02/09/2023) for F/U with Pre-visit Labs, Meter/CGM/Logs, A1c here.  Marquis Lunch, MD Phone: [phone]  Fax: [phone]  This note was partially dictated with voice recognition software. Similar sounding words can be transcribed inadequately or may not  be corrected upon review.  08/09/2022, 3:53 PM

## 2022-08-14 DIAGNOSIS — I10 Essential (primary) hypertension: Secondary | ICD-10-CM | POA: Diagnosis not present

## 2022-08-14 DIAGNOSIS — M549 Dorsalgia, unspecified: Secondary | ICD-10-CM | POA: Diagnosis not present

## 2022-08-14 DIAGNOSIS — Z299 Encounter for prophylactic measures, unspecified: Secondary | ICD-10-CM | POA: Diagnosis not present

## 2022-08-14 DIAGNOSIS — E1165 Type 2 diabetes mellitus with hyperglycemia: Secondary | ICD-10-CM | POA: Diagnosis not present

## 2022-11-07 ENCOUNTER — Other Ambulatory Visit: Payer: Self-pay | Admitting: "Endocrinology

## 2022-11-07 ENCOUNTER — Other Ambulatory Visit: Payer: Self-pay

## 2022-11-07 DIAGNOSIS — E038 Other specified hypothyroidism: Secondary | ICD-10-CM

## 2022-11-07 MED ORDER — SEMAGLUTIDE (1 MG/DOSE) 4 MG/3ML ~~LOC~~ SOPN: 1.0000 mg | PEN_INJECTOR | SUBCUTANEOUS | 0 refills | Status: DC

## 2022-11-13 DIAGNOSIS — Z299 Encounter for prophylactic measures, unspecified: Secondary | ICD-10-CM | POA: Diagnosis not present

## 2022-11-13 DIAGNOSIS — J069 Acute upper respiratory infection, unspecified: Secondary | ICD-10-CM | POA: Diagnosis not present

## 2022-11-13 DIAGNOSIS — R52 Pain, unspecified: Secondary | ICD-10-CM | POA: Diagnosis not present

## 2022-11-13 DIAGNOSIS — E1165 Type 2 diabetes mellitus with hyperglycemia: Secondary | ICD-10-CM | POA: Diagnosis not present

## 2022-11-13 DIAGNOSIS — I10 Essential (primary) hypertension: Secondary | ICD-10-CM | POA: Diagnosis not present

## 2022-11-13 DIAGNOSIS — M546 Pain in thoracic spine: Secondary | ICD-10-CM | POA: Diagnosis not present

## 2022-11-13 DIAGNOSIS — R110 Nausea: Secondary | ICD-10-CM | POA: Diagnosis not present

## 2022-11-15 DIAGNOSIS — R0602 Shortness of breath: Secondary | ICD-10-CM | POA: Diagnosis not present

## 2022-11-15 DIAGNOSIS — Z79899 Other long term (current) drug therapy: Secondary | ICD-10-CM | POA: Diagnosis not present

## 2022-11-21 ENCOUNTER — Encounter (HOSPITAL_COMMUNITY): Payer: Self-pay

## 2022-11-21 ENCOUNTER — Other Ambulatory Visit: Payer: Self-pay

## 2022-11-21 ENCOUNTER — Emergency Department (HOSPITAL_COMMUNITY)
Admission: EM | Admit: 2022-11-21 | Discharge: 2022-11-21 | Disposition: A | Discharge status: Home / Self Care | Payer: Medicare PPO | Attending: Emergency Medicine | Admitting: Emergency Medicine

## 2022-11-21 ENCOUNTER — Emergency Department (HOSPITAL_COMMUNITY): Payer: Medicare PPO

## 2022-11-21 DIAGNOSIS — J449 Chronic obstructive pulmonary disease, unspecified: Secondary | ICD-10-CM | POA: Insufficient documentation

## 2022-11-21 DIAGNOSIS — M546 Pain in thoracic spine: Secondary | ICD-10-CM | POA: Diagnosis not present

## 2022-11-21 DIAGNOSIS — E119 Type 2 diabetes mellitus without complications: Secondary | ICD-10-CM | POA: Diagnosis not present

## 2022-11-21 DIAGNOSIS — M545 Low back pain, unspecified: Secondary | ICD-10-CM | POA: Diagnosis not present

## 2022-11-21 DIAGNOSIS — I700 Atherosclerosis of aorta: Secondary | ICD-10-CM | POA: Diagnosis not present

## 2022-11-21 DIAGNOSIS — I10 Essential (primary) hypertension: Secondary | ICD-10-CM | POA: Insufficient documentation

## 2022-11-21 DIAGNOSIS — Z7982 Long term (current) use of aspirin: Secondary | ICD-10-CM | POA: Insufficient documentation

## 2022-11-21 DIAGNOSIS — R079 Chest pain, unspecified: Secondary | ICD-10-CM | POA: Diagnosis not present

## 2022-11-21 DIAGNOSIS — R0602 Shortness of breath: Secondary | ICD-10-CM | POA: Insufficient documentation

## 2022-11-21 DIAGNOSIS — M549 Dorsalgia, unspecified: Secondary | ICD-10-CM

## 2022-11-21 LAB — CBC
HCT: 44.4 % (ref 36.0–46.0)
Hemoglobin: 13.6 g/dL (ref 12.0–15.0)
MCH: 28.3 pg (ref 26.0–34.0)
MCHC: 30.6 g/dL (ref 30.0–36.0)
MCV: 92.3 fL (ref 80.0–100.0)
Platelets: 195 10*3/uL (ref 150–400)
RBC: 4.81 MIL/uL (ref 3.87–5.11)
RDW: 15.6 % — ABNORMAL HIGH (ref 11.5–15.5)
WBC: 6.4 10*3/uL (ref 4.0–10.5)
nRBC: 0 % (ref 0.0–0.2)

## 2022-11-21 LAB — BASIC METABOLIC PANEL
Anion gap: 10 (ref 5–15)
BUN: 25 mg/dL — ABNORMAL HIGH (ref 8–23)
CO2: 26 mmol/L (ref 22–32)
Calcium: 9.2 mg/dL (ref 8.9–10.3)
Chloride: 99 mmol/L (ref 98–111)
Creatinine, Ser: 1.43 mg/dL — ABNORMAL HIGH (ref 0.44–1.00)
GFR, Estimated: 38 mL/min — ABNORMAL LOW (ref >60)
Glucose, Bld: 184 mg/dL — ABNORMAL HIGH (ref 70–99)
Potassium: 4.5 mmol/L (ref 3.5–5.1)
Sodium: 135 mmol/L (ref 135–145)

## 2022-11-21 LAB — TROPONIN I (HIGH SENSITIVITY): Troponin I (High Sensitivity): 5 ng/L (ref <18)

## 2022-11-21 LAB — D-DIMER, QUANTITATIVE: D-Dimer, Quant: 0.37 ug{FEU}/mL (ref 0.00–0.50)

## 2022-11-21 MED ORDER — ACETAMINOPHEN 500 MG PO TABS
1000.0000 mg | ORAL_TABLET | Freq: Once | ORAL | Status: AC
  Administered 2022-11-21: 1000 mg via ORAL
  Filled 2022-11-21: qty 2

## 2022-11-21 MED ORDER — CYCLOBENZAPRINE HCL 10 MG PO TABS: 10.0000 mg | ORAL_TABLET | Freq: Two times a day (BID) | ORAL | 0 refills | Status: AC | PRN

## 2022-11-21 MED ORDER — LIDOCAINE 5 % EX PTCH
1.0000 | MEDICATED_PATCH | CUTANEOUS | Status: DC
  Administered 2022-11-21: 1 via TRANSDERMAL
  Filled 2022-11-21: qty 1

## 2022-11-21 MED ORDER — CYCLOBENZAPRINE HCL 10 MG PO TABS
10.0000 mg | ORAL_TABLET | Freq: Once | ORAL | Status: AC
  Administered 2022-11-21: 10 mg via ORAL
  Filled 2022-11-21: qty 1

## 2022-11-21 NOTE — ED Triage Notes (Signed)
Pt reports she has had left side upper back pain that is reproducible with movement and deep breathing.  Pt saw her PCP last week for same and was given some medication but is not feeling better.

## 2022-11-21 NOTE — ED Provider Notes (Incomplete)
EMERGENCY DEPARTMENT AT [facility] Provider Note   CSN: [id number] Arrival date & time: 11/21/22  1505     History {Add pertinent medical, surgical, social history, OB history to HPI:1} Chief Complaint  Patient presents with   Back Pain    Dana Lawson is a 77 y.o. female.  77 year old female with history of COPD, hypertension, hyperlipidemia, diabetes who presents emergency department with left upper back pain.  For the past 6 days has been having left upper back pain.  Went to her outpatient doctor and had a shot of a steroid and was given a short course of azithromycin.  Did have a chest x-ray that day but is unsure what the results are.  Says that she was feeling better but now her chest pain has come back.  Moderate in severity.  Describes it as a tightness in her left upper back.  Worsened with breathing and movement.  Does not appear to be exertional.  No fevers or cough but does have some shortness of breath.  Not on blood thinners.  No history of DVT or PE.  No lower extremity weakness or numbness or bowel or bladder incontinence.       Home Medications Prior to Admission medications   Medication Sig Start Date End Date Taking? Authorizing Provider  aspirin 81 MG tablet Take 81 mg by mouth 3 (three) times a week.     [provider]  benazepril (LOTENSIN) 40 MG tablet Take 40 mg by mouth daily.    [provider]  Blood Glucose Monitoring Suppl (FREESTYLE LITE) DEVI 1 each by Does not apply route 2 (two) times daily. Use as instructed to check blood glucose two times a day. 08/10/19   Roma Kayser, MD  buPROPion (WELLBUTRIN SR) 150 MG 12 hr tablet Take 150 mg by mouth 2 (two) times daily.    [provider]  chlorhexidine (PERIDEX) 0.12 % solution Use as directed 5 mLs in the mouth or throat 2 (two) times a week.  07/09/19   [provider]  Cholecalciferol (VITAMIN D3) 125 MCG (5000 UT) CAPS Take 1 capsule  (5,000 Units total) by mouth daily. Patient taking differently: Take 5,000 Units by mouth 2 (two) times a week.  11/17/18   Roma Kayser, MD  clonazePAM (KLONOPIN) 0.5 MG tablet Take 0.5 mg by mouth at bedtime.  08/03/19   [provider]  Coenzyme Q10 (CO Q 10) 100 MG CAPS Take 100 mg by mouth daily.    [provider]  empagliflozin (JARDIANCE) 25 MG TABS tablet Take 1 tablet (25 mg total) by mouth daily before breakfast. 08/09/22   Nida, Denman George, MD  fenofibrate (TRICOR) 48 MG tablet TAKE 1 TABLET DAILY 11/14/20   Roma Kayser, MD  furosemide (LASIX) 40 MG tablet Take 40 mg by mouth daily.     [provider]  glucose blood (ACCU-CHEK GUIDE) test strip Use as instructed 08/03/19   Roma Kayser, MD  glucose blood test strip 1 each by Other route 2 (two) times daily. Use as instructed bid. E11.65 07/30/19   Roma Kayser, MD  Lancets (FREESTYLE) lancets Use as instructed to check blood glucose twice daily 08/06/19   Roma Kayser, MD  levothyroxine (SYNTHROID) 150 MCG tablet TAKE 1 TABLET DAILY BEFORE BREAKFAST 11/07/22   Roma Kayser, MD  meloxicam (MOBIC) 15 MG tablet Take 15 mg by mouth daily.    [provider]  omeprazole (  PRILOSEC) 20 MG capsule Take 20 mg by mouth daily.    [provider]  potassium chloride (K-DUR) 10 MEQ tablet Take 10 mEq by mouth daily.    [provider]  Probiotic Product (PROBIOTIC DAILY PO) Take 1 tablet by mouth daily. Woman's    [provider]  rosuvastatin (CRESTOR) 5 MG tablet Take 5 mg by mouth daily. 05/30/19   [provider]  Semaglutide, 1 MG/DOSE, 4 MG/3ML SOPN Inject 1 mg as directed once a week. 11/07/22   Roma Kayser, MD      Allergies    Flagyl [metronidazole]    Review of Systems   Review of Systems  Physical Exam Updated Vital Signs BP (!) 168/76 (BP Location: Right Arm)   Pulse 92   Temp 97.9 F (36.6 C)  (Oral)   Resp 16   Ht 5\' 7"  (1.702 m)   Wt 95.7 kg   SpO2 93%   BMI 33.05 kg/m  Physical Exam Vitals and nursing note reviewed.  Constitutional:      General: She is not in acute distress.    Appearance: She is well-developed.  HENT:     Head: Normocephalic and atraumatic.     Right Ear: External ear normal.     Left Ear: External ear normal.     Nose: Nose normal.  Eyes:     Extraocular Movements: Extraocular movements intact.     Conjunctiva/sclera: Conjunctivae normal.     Pupils: Pupils are equal, round, and reactive to light.  Cardiovascular:     Rate and Rhythm: Normal rate and regular rhythm.     Heart sounds: No murmur heard. Pulmonary:     Effort: Pulmonary effort is normal. No respiratory distress.     Breath sounds: Normal breath sounds.  Musculoskeletal:     Cervical back: Normal range of motion and neck supple.     Right lower leg: No edema.     Left lower leg: No edema.     Comments: No spinal midline TTP in cervical, thoracic, or lumbar spine. No stepoffs noted.   Motor: Muscle bulk and tone are normal. Strength is 5/5 in hip flexion, knee flexion and extension, ankle dorsiflexion and plantar flexion bilaterally. Full strength of great toe dorsiflexion bilaterally.  Sensory: Intact sensation to light touch in L2 though S1 dermatomes bilaterally.   Skin:    General: Skin is warm and dry.  Neurological:     Mental Status: She is alert and oriented to person, place, and time. Mental status is at baseline.  Psychiatric:        Mood and Affect: Mood normal.     ED Results / Procedures / Treatments   Labs (all labs ordered are listed, but only abnormal results are displayed) Labs Reviewed - No data to display  EKG None  Radiology No results found.  Procedures Procedures  {Document cardiac monitor, telemetry assessment procedure when appropriate:1}  Medications Ordered in ED Medications - No data to display  ED Course/ Medical Decision Making/  A&P   {   Click here for ABCD2, HEART and other calculatorsREFRESH Note before signing :1}                              Medical Decision Making Amount and/or Complexity of Data Reviewed Labs: ordered. Radiology: ordered.  Risk OTC drugs. Prescription drug management.   ***  {Document critical care time when appropriate:1} {Document review  of labs and clinical decision tools ie heart score, Chads2Vasc2 etc:1}  {Document your independent review of radiology images, and any outside records:1} {Document your discussion with family members, caretakers, and with consultants:1} {Document social determinants of health affecting pt's care:1} {Document your decision making why or why not admission, treatments were needed:1} Final Clinical Impression(s) / ED Diagnoses Final diagnoses:  None    Rx / DC Orders ED Discharge Orders     None

## 2022-11-21 NOTE — Discharge Instructions (Addendum)
You were seen for your back pain in the emergency department.   Follow-up on your MyChart for your chest x-ray results  At home, please use over-the-counter Tylenol and lidocaine patches.  You may also use the cyclobenzaprine we have prescribed you as needed for pain.  Do not take the cyclobenzaprine before driving or operating heavy machinery.  Follow-up with your primary doctor in 2-3 days regarding your visit.   Return immediately to the emergency department if you experience any of the following: Numbness or weakness of your legs, bowel or bladder incontinence, numbness while wiping after pooping or urinating, or any other concerning symptoms.    Thank you for visiting our Emergency Department. It was a pleasure taking care of you today.

## 2022-11-22 NOTE — ED Provider Notes (Signed)
[place] EMERGENCY DEPARTMENT AT [facility] Provider Note   CSN: [id number] Arrival date & time: 11/21/22  1505     History  Chief Complaint  Patient presents with   Back Pain    Dana Lawson is a 77 y.o. female.  77 year old female with history of COPD, hypertension, hyperlipidemia, diabetes who presents emergency department with left upper back pain.  For the past 6 days has been having left upper back pain.  Went to her outpatient doctor and had a shot of a steroid and was given a short course of azithromycin.  Did have a chest x-ray that day but is unsure what the results are.  Says that she was feeling better but now her chest pain has come back.  Moderate in severity.  Describes it as a tightness in her left upper back.  Worsened with breathing and movement.  Does not appear to be exertional.  No fevers or cough but does have some shortness of breath.  Not on blood thinners.  No history of DVT or PE.  No lower extremity weakness or numbness or bowel or bladder incontinence.       Home Medications Prior to Admission medications   Medication Sig Start Date End Date Taking? Authorizing Provider  aspirin 81 MG tablet Take 81 mg by mouth daily.   Yes [provider]  benazepril (LOTENSIN) 40 MG tablet Take 40 mg by mouth daily.   Yes [provider]  buPROPion (WELLBUTRIN SR) 150 MG 12 hr tablet Take 150 mg by mouth 2 (two) times daily.   Yes [provider]  chlorhexidine (PERIDEX) 0.12 % solution Use as directed 5 mLs in the mouth or throat 2 (two) times a week.  07/09/19  Yes [provider]  Coenzyme Q10 (CO Q 10) 100 MG CAPS Take 100 mg by mouth daily.   Yes [provider]  cyclobenzaprine (FLEXERIL) 10 MG tablet Take 1 tablet (10 mg total) by mouth 2 (two) times daily as needed for muscle spasms. 11/21/22  Yes Rondel Baton, MD  empagliflozin (JARDIANCE) 25 MG TABS tablet Take 1 tablet (25 mg total) by mouth daily  before breakfast. 08/09/22  Yes Nida, Denman George, MD  febuxostat (ULORIC) 40 MG tablet Take 40 mg by mouth daily. 11/10/22  Yes [provider]  furosemide (LASIX) 40 MG tablet Take 40 mg by mouth daily.    Yes [provider]  levothyroxine (SYNTHROID) 150 MCG tablet TAKE 1 TABLET DAILY BEFORE BREAKFAST 11/07/22  Yes Nida, Denman George, MD  omeprazole (PRILOSEC) 20 MG capsule Take 20 mg by mouth daily.   Yes [provider]  ondansetron (ZOFRAN) 4 MG tablet Take 4 mg by mouth every 8 (eight) hours as needed. 11/13/22  Yes [provider]  potassium chloride (K-DUR) 10 MEQ tablet Take 10 mEq by mouth daily.   Yes [provider]  Probiotic Product (PROBIOTIC DAILY PO) Take 1 tablet by mouth daily. Woman's   Yes [provider]  rosuvastatin (CRESTOR) 5 MG tablet Take 5 mg by mouth daily. 05/30/19  Yes [provider]  Semaglutide, 1 MG/DOSE, 4 MG/3ML SOPN Inject 1 mg as directed once a week. Patient taking differently: Inject 1 mg as directed once a week. Wednesday 11/07/22  Yes Roma Kayser, MD  traMADol (ULTRAM) 50 MG tablet Take 50 mg by mouth 3 (three) times daily as needed. 08/14/22  Yes [provider]  azithromycin (ZITHROMAX) 250 MG tablet Take 250  mg by mouth as directed. 11/13/22   [provider]  Blood Glucose Monitoring Suppl (FREESTYLE LITE) DEVI 1 each by Does not apply route 2 (two) times daily. Use as instructed to check blood glucose two times a day. 08/10/19   Roma Kayser, MD  glucose blood (ACCU-CHEK GUIDE) test strip Use as instructed 08/03/19   Roma Kayser, MD  glucose blood test strip 1 each by Other route 2 (two) times daily. Use as instructed bid. E11.65 07/30/19   Roma Kayser, MD  Lancets (FREESTYLE) lancets Use as instructed to check blood glucose twice daily 08/06/19   Roma Kayser, MD      Allergies    Metronidazole    Review of Systems    Review of Systems  Physical Exam Updated Vital Signs BP (!) 140/83   Pulse 75   Temp 97.9 F (36.6 C) (Oral)   Resp 16   Ht 5\' 7"  (1.702 m)   Wt 95.7 kg   SpO2 96%   BMI 33.05 kg/m  Physical Exam Vitals and nursing note reviewed.  Constitutional:      General: She is not in acute distress.    Appearance: She is well-developed.  HENT:     Head: Normocephalic and atraumatic.     Right Ear: External ear normal.     Left Ear: External ear normal.     Nose: Nose normal.  Eyes:     Extraocular Movements: Extraocular movements intact.     Conjunctiva/sclera: Conjunctivae normal.     Pupils: Pupils are equal, round, and reactive to light.  Cardiovascular:     Rate and Rhythm: Normal rate and regular rhythm.     Heart sounds: No murmur heard. Pulmonary:     Effort: Pulmonary effort is normal. No respiratory distress.     Breath sounds: Normal breath sounds.  Musculoskeletal:     Cervical back: Normal range of motion and neck supple.     Right lower leg: No edema.     Left lower leg: No edema.     Comments: No spinal midline TTP in cervical, thoracic, or lumbar spine. No stepoffs noted.   Motor: Muscle bulk and tone are normal. Strength is 5/5 in hip flexion, knee flexion and extension, ankle dorsiflexion and plantar flexion bilaterally. Full strength of great toe dorsiflexion bilaterally.  Sensory: Intact sensation to light touch in L2 though S1 dermatomes bilaterally.   Skin:    General: Skin is warm and dry.  Neurological:     Mental Status: She is alert and oriented to person, place, and time. Mental status is at baseline.  Psychiatric:        Mood and Affect: Mood normal.     ED Results / Procedures / Treatments   Labs (all labs ordered are listed, but only abnormal results are displayed) Labs Reviewed  BASIC METABOLIC PANEL - Abnormal; Notable for the following components:      Result Value   Glucose, Bld 184 (*)    BUN 25 (*)    Creatinine, Ser 1.43 (*)     GFR, Estimated 38 (*)    All other components within normal limits  CBC - Abnormal; Notable for the following components:   RDW 15.6 (*)    All other components within normal limits  D-DIMER, QUANTITATIVE  TROPONIN I (HIGH SENSITIVITY)    EKG None  Radiology DG Chest 2 View  Result Date: 11/21/2022 CLINICAL DATA:  Left chest pain EXAM: CHEST - 2  VIEW COMPARISON:  11/15/2022 FINDINGS: The heart size and mediastinal contours are stable. Aortic atherosclerosis. Both lungs are clear. The visualized skeletal structures are unremarkable. IMPRESSION: No active cardiopulmonary disease. Electronically Signed   By: Duanne Guess D.O.   On: 11/21/2022 17:48    Procedures Procedures    Medications Ordered in ED Medications  cyclobenzaprine (FLEXERIL) tablet 10 mg (10 mg Oral Given 11/21/22 1637)  acetaminophen (TYLENOL) tablet 1,000 mg (1,000 mg Oral Given 11/21/22 1636)    ED Course/ Medical Decision Making/ A&P Clinical Course as of 11/22/22 1508  Wed Nov 21, 2022  1650 Creatinine(!): 1.43 At baseline [RP]    Clinical Course User Index [RP] Rondel Baton, MD                                 Medical Decision Making Amount and/or Complexity of Data Reviewed Labs: ordered. Decision-making details documented in ED Course. Radiology: ordered.  Risk OTC drugs. Prescription drug management.   Dana Lawson is a 77 y.o. female with comorbidities that complicate the patient evaluation including COPD, hypertension, hyperlipidemia, diabetes who presents emergency department with left upper back pain.   Initial Ddx:  Muscle strain, fracture, pneumonia, pleurisy, PE  MDM/Course:  Patient presents emergency department with left upper back pain.  Does appear to be pleuritic and worsened with movement.  Lungs are clear to auscultation bilaterally.  No lower extremity deficits or concerning findings.  Had a chest x-ray here which did not show acute abnormalities.  Had a D-dimer  that was sent as well which was WNL.  Upon re-evaluation the patient was feeling better.  Suspect that she likely has a muscle strain causing her symptoms.  Will have her follow-up with her primary doctor in several days and use OTC treatment for her pain.  This patient presents to the ED for concern of complaints listed in HPI, this involves an extensive number of treatment options, and is a complaint that carries with it a high risk of complications and morbidity. Disposition including potential need for admission considered.   Dispo: DC Home. Return precautions discussed including, but not limited to, those listed in the AVS. Allowed pt time to ask questions which were answered fully prior to dc.  Records reviewed Outpatient Clinic Notes The following labs were independently interpreted: Chemistry and show CKD I independently reviewed the following imaging with scope of interpretation limited to determining acute life threatening conditions related to emergency care: Chest x-ray and agree with the radiologist interpretation with the following exceptions: none I personally reviewed and interpreted cardiac monitoring: normal sinus rhythm  I personally reviewed and interpreted the pt's EKG: see above for interpretation  I have reviewed the patients home medications and made adjustments as needed Social Determinants of health:  Elderly  Portions of this note were generated with Scientist, clinical (histocompatibility and immunogenetics). Dictation errors may occur despite best attempts at proofreading.           Final Clinical Impression(s) / ED Diagnoses Final diagnoses:  Upper back pain  Shortness of breath    Rx / DC Orders ED Discharge Orders          Ordered    cyclobenzaprine (FLEXERIL) 10 MG tablet  2 times daily PRN        11/21/22 1740              Rondel Baton, MD 11/22/22 [phone]

## 2022-11-30 ENCOUNTER — Encounter (HOSPITAL_COMMUNITY): Payer: Self-pay

## 2022-11-30 ENCOUNTER — Other Ambulatory Visit: Payer: Self-pay

## 2022-11-30 ENCOUNTER — Emergency Department (HOSPITAL_COMMUNITY): Payer: Medicare PPO

## 2022-11-30 ENCOUNTER — Emergency Department (HOSPITAL_COMMUNITY)
Admission: EM | Admit: 2022-11-30 | Discharge: 2022-12-01 | Disposition: A | Discharge status: Home / Self Care | Payer: Medicare PPO | Attending: Emergency Medicine | Admitting: Emergency Medicine

## 2022-11-30 DIAGNOSIS — M546 Pain in thoracic spine: Secondary | ICD-10-CM | POA: Diagnosis not present

## 2022-11-30 DIAGNOSIS — R918 Other nonspecific abnormal finding of lung field: Secondary | ICD-10-CM | POA: Diagnosis not present

## 2022-11-30 DIAGNOSIS — J449 Chronic obstructive pulmonary disease, unspecified: Secondary | ICD-10-CM | POA: Insufficient documentation

## 2022-11-30 DIAGNOSIS — R079 Chest pain, unspecified: Secondary | ICD-10-CM | POA: Insufficient documentation

## 2022-11-30 DIAGNOSIS — M545 Low back pain, unspecified: Secondary | ICD-10-CM | POA: Insufficient documentation

## 2022-11-30 DIAGNOSIS — Z7982 Long term (current) use of aspirin: Secondary | ICD-10-CM | POA: Diagnosis not present

## 2022-11-30 DIAGNOSIS — E039 Hypothyroidism, unspecified: Secondary | ICD-10-CM | POA: Insufficient documentation

## 2022-11-30 DIAGNOSIS — J439 Emphysema, unspecified: Secondary | ICD-10-CM | POA: Diagnosis not present

## 2022-11-30 DIAGNOSIS — R0789 Other chest pain: Secondary | ICD-10-CM | POA: Diagnosis not present

## 2022-11-30 DIAGNOSIS — M549 Dorsalgia, unspecified: Secondary | ICD-10-CM

## 2022-11-30 DIAGNOSIS — R0600 Dyspnea, unspecified: Secondary | ICD-10-CM | POA: Diagnosis not present

## 2022-11-30 DIAGNOSIS — E119 Type 2 diabetes mellitus without complications: Secondary | ICD-10-CM | POA: Diagnosis not present

## 2022-11-30 DIAGNOSIS — Z79899 Other long term (current) drug therapy: Secondary | ICD-10-CM | POA: Insufficient documentation

## 2022-11-30 DIAGNOSIS — R911 Solitary pulmonary nodule: Secondary | ICD-10-CM | POA: Diagnosis not present

## 2022-11-30 DIAGNOSIS — I10 Essential (primary) hypertension: Secondary | ICD-10-CM | POA: Diagnosis not present

## 2022-11-30 LAB — CBC
HCT: 46.8 % — ABNORMAL HIGH (ref 36.0–46.0)
Hemoglobin: 13.7 g/dL (ref 12.0–15.0)
MCH: 27.4 pg (ref 26.0–34.0)
MCHC: 29.3 g/dL — ABNORMAL LOW (ref 30.0–36.0)
MCV: 93.6 fL (ref 80.0–100.0)
Platelets: 200 10*3/uL (ref 150–400)
RBC: 5 MIL/uL (ref 3.87–5.11)
RDW: 15.3 % (ref 11.5–15.5)
WBC: 7.6 10*3/uL (ref 4.0–10.5)
nRBC: 0 % (ref 0.0–0.2)

## 2022-11-30 LAB — CBG MONITORING, ED: Glucose-Capillary: 123 mg/dL — ABNORMAL HIGH (ref 70–99)

## 2022-11-30 LAB — BASIC METABOLIC PANEL
Anion gap: 6 (ref 5–15)
BUN: 21 mg/dL (ref 8–23)
CO2: 28 mmol/L (ref 22–32)
Calcium: 9.7 mg/dL (ref 8.9–10.3)
Chloride: 101 mmol/L (ref 98–111)
Creatinine, Ser: 1.1 mg/dL — ABNORMAL HIGH (ref 0.44–1.00)
GFR, Estimated: 53 mL/min — ABNORMAL LOW (ref >60)
Glucose, Bld: 144 mg/dL — ABNORMAL HIGH (ref 70–99)
Potassium: 4.8 mmol/L (ref 3.5–5.1)
Sodium: 135 mmol/L (ref 135–145)

## 2022-11-30 LAB — TROPONIN I (HIGH SENSITIVITY)
Troponin I (High Sensitivity): 4 ng/L (ref <18)
Troponin I (High Sensitivity): 5 ng/L (ref <18)

## 2022-11-30 MED ORDER — METHOCARBAMOL 500 MG PO TABS
500.0000 mg | ORAL_TABLET | Freq: Once | ORAL | Status: AC
  Administered 2022-11-30: 500 mg via ORAL
  Filled 2022-11-30: qty 1

## 2022-11-30 MED ORDER — LIDOCAINE 5 % EX PTCH
1.0000 | MEDICATED_PATCH | CUTANEOUS | Status: DC
  Administered 2022-11-30: 1 via TRANSDERMAL
  Filled 2022-11-30: qty 1

## 2022-11-30 MED ORDER — LIDOCAINE 5 % EX PTCH: 1.0000 | MEDICATED_PATCH | CUTANEOUS | 0 refills | Status: AC

## 2022-11-30 MED ORDER — IOHEXOL 350 MG/ML SOLN
75.0000 mL | Freq: Once | INTRAVENOUS | Status: AC | PRN
  Administered 2022-11-30: 75 mL via INTRAVENOUS

## 2022-11-30 NOTE — Discharge Instructions (Addendum)
Seen for pain in your back and chest.  Your workup was reassuring.  Follow-up closely with her primary care doctor.  You can use the lidocaine patches to help with the pain as well as a heating pad for 15 to 20 minutes at a time.  Please come back to the ER if you have shortness of breath, severe pain, dizziness or passing out.  Your CT scan showed nodules in your lungs.  These are most likely scar tissue from prior infections.  However, it is recommended that you have a repeat CT scan in 12 months.  If the nodules do not grow in that time, you do not need any additional workup.

## 2022-11-30 NOTE — ED Provider Notes (Signed)
Care assumed form [facility], patient with chest/upper back pain pending CT angiogram of chest. Plan is for discharge if no pulmonary embolism.  CT scan shows no evidence of pulmonary embolism, but pulmonary nodules are noted.  I have independently viewed the images, and agree with the radiologist's interpretation.  Patient does have history of smoking, will need repeat CT scan in 12 months to ensure stability of nodules.  I have explained this to the patient.  Results for orders placed or performed during the hospital encounter of 11/30/22  Basic metabolic panel  Result Value Ref Range   Sodium 135 135 - 145 mmol/L   Potassium 4.8 3.5 - 5.1 mmol/L   Chloride 101 98 - 111 mmol/L   CO2 28 22 - 32 mmol/L   Glucose, Bld 144 (H) 70 - 99 mg/dL   BUN 21 8 - 23 mg/dL   Creatinine, Ser 8.65 (H) 0.44 - 1.00 mg/dL   Calcium 9.7 8.9 - 78.4 mg/dL   GFR, Estimated 53 (L) >60 mL/min   Anion gap 6 5 - 15  CBC  Result Value Ref Range   WBC 7.6 4.0 - 10.5 K/uL   RBC 5.00 3.87 - 5.11 MIL/uL   Hemoglobin 13.7 12.0 - 15.0 g/dL   HCT 69.6 (H) 29.5 - 28.4 %   MCV 93.6 80.0 - 100.0 fL   MCH 27.4 26.0 - 34.0 pg   MCHC 29.3 (L) 30.0 - 36.0 g/dL   RDW 13.2 44.0 - 10.2 %   Platelets 200 150 - 400 K/uL   nRBC 0.0 0.0 - 0.2 %  CBG monitoring, ED  Result Value Ref Range   Glucose-Capillary 123 (H) 70 - 99 mg/dL  Troponin I (High Sensitivity)  Result Value Ref Range   Troponin I (High Sensitivity) 4 <18 ng/L  Troponin I (High Sensitivity)  Result Value Ref Range   Troponin I (High Sensitivity) 5 <18 ng/L   CT Angio Chest PE W and/or Wo Contrast  Result Date: 11/30/2022 CLINICAL DATA:  High probability pulmonary embolism, left chest pain, dyspnea EXAM: CT ANGIOGRAPHY CHEST WITH CONTRAST TECHNIQUE: Multidetector CT imaging of the chest was performed using the standard protocol during bolus administration of intravenous contrast. Multiplanar CT image reconstructions and MIPs were obtained to  evaluate the vascular anatomy. RADIATION DOSE REDUCTION: This exam was performed according to the departmental dose-optimization program which includes automated exposure control, adjustment of the mA and/or kV according to patient size and/or use of iterative reconstruction technique. CONTRAST:  75mL OMNIPAQUE IOHEXOL 350 MG/ML SOLN COMPARISON:  10/28/2014 FINDINGS: Cardiovascular: Mild coronary artery calcification. Calcification of the aortic valve leaflets noted. Global cardiac size within normal limits. The central pulmonary arteries are of normal caliber. There is adequate opacification of the pulmonary arterial tree through the segmental level and no intraluminal filling defect is identified to suggest acute pulmonary embolism. Moderate atherosclerotic calcification within the thoracic aorta. No aortic aneurysm. Mediastinum/Nodes: No enlarged mediastinal, hilar, or axillary lymph nodes. Thyroid gland, trachea, and esophagus demonstrate no significant findings. Lungs/Pleura: Moderate emphysema. Stable diffuse bronchial wall thickening is present in keeping with changes of chronic airway inflammation. Mild bibasilar atelectasis or parenchymal scarring noted. No confluent pulmonary infiltrate. 3 mm noncalcified pulmonary nodule within the left upper lobe, axial image # 80/6, indeterminate. 4 mm noncalcified pulmonary nodule right upper lobe, axial image # 49/6, indeterminate. No confluent pulmonary infiltrate. No pneumothorax or pleural effusion. No central obstructing lesion. Upper Abdomen: No acute abnormality. Musculoskeletal: No chest wall abnormality. No  acute or significant osseous findings. Review of the MIP images confirms the above findings. IMPRESSION: 1. No pulmonary embolism. No acute intrathoracic pathology identified. 2. Mild coronary artery calcification. 3. Moderate emphysema. Stable diffuse bronchial wall thickening in keeping with changes of chronic airway inflammation. 4. Multiple pulmonary  nodules. Most significant: Right solid pulmonary nodule within the upper lobe measuring 4 mm. Per Fleischner Society Guidelines,if patient is low risk for malignancy, no routine follow-up imaging is recommended. If patient is high risk for malignancy, a non-contrast Chest CT at 12 months is optional. If performed and the nodule is stable at 12 months, no further follow-up is recommended. These guidelines do not apply to immunocompromised patients and patients with cancer. Follow up in patients with significant comorbidities as clinically warranted. For lung cancer screening, adhere to Lung-RADS guidelines. Reference: Radiology. 2017; 284(1):228-43. Aortic Atherosclerosis (ICD10-I70.0) and Emphysema (ICD10-J43.9). Electronically Signed   By: Helyn Numbers M.D.   On: 11/30/2022 23:33   DG Chest 2 View  Result Date: 11/30/2022 CLINICAL DATA:  Left-sided chest pain. EXAM: CHEST - 2 VIEW COMPARISON:  November 21, 2022 FINDINGS: The heart size and mediastinal contours are within normal limits. Very mild atelectatic changes are seen within the bilateral lung bases. There is no evidence of an acute infiltrate, pleural effusion or pneumothorax. The visualized skeletal structures are unremarkable. IMPRESSION: No active cardiopulmonary disease. Electronically Signed   By: Aram Candela M.D.   On: 11/30/2022 20:02   DG Chest 2 View  Result Date: 11/21/2022 CLINICAL DATA:  Left chest pain EXAM: CHEST - 2 VIEW COMPARISON:  11/15/2022 FINDINGS: The heart size and mediastinal contours are stable. Aortic atherosclerosis. Both lungs are clear. The visualized skeletal structures are unremarkable. IMPRESSION: No active cardiopulmonary disease. Electronically Signed   By: Duanne Guess D.O.   On: 11/21/2022 17:48      Dione Booze, MD 12/01/22 [phone]

## 2022-11-30 NOTE — ED Notes (Signed)
Patient called stating that she was diabetic and felt as if her blood sugar was dropping. Checked patient's CBG 123. Patient requesting something to eat and drink. PA Beatty at bedside states that it is ok. Patient given dt shasta twist, peanut butter and graham crackers.

## 2022-11-30 NOTE — ED Triage Notes (Signed)
Left sided CP that radiates through to back Sharp pain SOB and nausea  Pain comes and goes 8/10

## 2022-11-30 NOTE — ED Provider Notes (Signed)
[place] EMERGENCY DEPARTMENT AT [facility] Provider Note   CSN: [id number] Arrival date & time: 11/30/22  1851     History  Chief Complaint  Patient presents with   Chest Pain    Dana Lawson is a 77 y.o. female. She has  has a past medical history of Anxiety, COPD (chronic obstructive pulmonary disease) (HCC), Depression, Essential hypertension, GERD (gastroesophageal reflux disease), Hepatic steatosis, Hyperlipidemia, Hypothyroidism, and Type 2 diabetes mellitus (HCC).  [place] with left lower back and left chest pain, has been going on since the beginning of the month, she was seen 1/13 had been going on for about 6 days at that time.  She had labs, negative D-dimer and chest x-ray at that time that were normal, pain is progressing, today she is having pain in her chest as well.  Denies any numbness tingling or weakness.  States she has pain with movement as well as with deep breath.  No history of similar episodes prior to this time.  No leg pain, no hematemesis, no fevers or cough.  Chest Pain      Home Medications Prior to Admission medications   Medication Sig Start Date End Date Taking? Authorizing Provider  aspirin 81 MG tablet Take 81 mg by mouth daily.    [provider]  azithromycin (ZITHROMAX) 250 MG tablet Take 250 mg by mouth as directed. 11/13/22   [provider]  benazepril (LOTENSIN) 40 MG tablet Take 40 mg by mouth daily.    [provider]  Blood Glucose Monitoring Suppl (FREESTYLE LITE) DEVI 1 each by Does not apply route 2 (two) times daily. Use as instructed to check blood glucose two times a day. 08/10/19   Roma Kayser, MD  buPROPion (WELLBUTRIN SR) 150 MG 12 hr tablet Take 150 mg by mouth 2 (two) times daily.    [provider]  chlorhexidine (PERIDEX) 0.12 % solution Use as directed 5 mLs in the mouth or throat 2 (two) times a week.  07/09/19   [provider]  Coenzyme Q10 (CO Q 10) 100 MG  CAPS Take 100 mg by mouth daily.    [provider]  cyclobenzaprine (FLEXERIL) 10 MG tablet Take 1 tablet (10 mg total) by mouth 2 (two) times daily as needed for muscle spasms. 11/21/22   Rondel Baton, MD  empagliflozin (JARDIANCE) 25 MG TABS tablet Take 1 tablet (25 mg total) by mouth daily before breakfast. 08/09/22   Nida, Denman George, MD  febuxostat (ULORIC) 40 MG tablet Take 40 mg by mouth daily. 11/10/22   [provider]  furosemide (LASIX) 40 MG tablet Take 40 mg by mouth daily.     [provider]  glucose blood (ACCU-CHEK GUIDE) test strip Use as instructed 08/03/19   Roma Kayser, MD  glucose blood test strip 1 each by Other route 2 (two) times daily. Use as instructed bid. E11.65 07/30/19   Roma Kayser, MD  Lancets (FREESTYLE) lancets Use as instructed to check blood glucose twice daily 08/06/19   Roma Kayser, MD  levothyroxine (SYNTHROID) 150 MCG tablet TAKE 1 TABLET DAILY BEFORE BREAKFAST 11/07/22   Roma Kayser, MD  omeprazole (PRILOSEC) 20 MG capsule Take 20 mg by mouth daily.    [provider]  ondansetron (ZOFRAN) 4 MG tablet Take 4 mg by mouth every 8 (eight) hours as needed. 11/13/22   [provider]  potassium chloride (K-DUR) 10 MEQ tablet Take 10 mEq by  mouth daily.    [provider]  Probiotic Product (PROBIOTIC DAILY PO) Take 1 tablet by mouth daily. Woman's    [provider]  rosuvastatin (CRESTOR) 5 MG tablet Take 5 mg by mouth daily. 05/30/19   [provider]  Semaglutide, 1 MG/DOSE, 4 MG/3ML SOPN Inject 1 mg as directed once a week. Patient taking differently: Inject 1 mg as directed once a week. Wednesday 11/07/22   Roma Kayser, MD  traMADol (ULTRAM) 50 MG tablet Take 50 mg by mouth 3 (three) times daily as needed. 08/14/22   [provider]      Allergies    Metronidazole    Review of Systems   Review of Systems  Cardiovascular:   Positive for chest pain.    Physical Exam Updated Vital Signs BP 126/60   Pulse 80   Temp 98.4 F (36.9 C) (Oral)   Resp 18   Ht 5\' 7"  (1.702 m)   Wt 96.2 kg   SpO2 90%   BMI 33.20 kg/m  Physical Exam Vitals and nursing note reviewed.  Constitutional:      General: She is not in acute distress.    Appearance: She is well-developed.  HENT:     Head: Normocephalic and atraumatic.  Eyes:     Conjunctiva/sclera: Conjunctivae normal.  Cardiovascular:     Rate and Rhythm: Normal rate and regular rhythm.     Heart sounds: No murmur heard. Pulmonary:     Effort: Pulmonary effort is normal. No respiratory distress.     Breath sounds: Normal breath sounds.  Abdominal:     Palpations: Abdomen is soft.     Tenderness: There is no abdominal tenderness.  Musculoskeletal:        General: No swelling. Normal range of motion.     Cervical back: Neck supple.     Right lower leg: No tenderness. No edema.     Left lower leg: No tenderness. No edema.  Skin:    General: Skin is warm and dry.     Capillary Refill: Capillary refill takes less than 2 seconds.  Neurological:     Mental Status: She is alert.     GCS: GCS eye subscore is 4. GCS verbal subscore is 5. GCS motor subscore is 6.     Motor: No atrophy.  Psychiatric:        Mood and Affect: Mood normal.     ED Results / Procedures / Treatments   Labs (all labs ordered are listed, but only abnormal results are displayed) Labs Reviewed  BASIC METABOLIC PANEL - Abnormal; Notable for the following components:      Result Value   Glucose, Bld 144 (*)    Creatinine, Ser 1.10 (*)    GFR, Estimated 53 (*)    All other components within normal limits  CBC - Abnormal; Notable for the following components:   HCT 46.8 (*)    MCHC 29.3 (*)    All other components within normal limits  TROPONIN I (HIGH SENSITIVITY)  TROPONIN I (HIGH SENSITIVITY)    EKG None  Radiology DG Chest 2 View  Result Date: 11/30/2022 CLINICAL DATA:   Left-sided chest pain. EXAM: CHEST - 2 VIEW COMPARISON:  November 21, 2022 FINDINGS: The heart size and mediastinal contours are within normal limits. Very mild atelectatic changes are seen within the bilateral lung bases. There is no evidence of an acute infiltrate, pleural effusion or pneumothorax. The visualized skeletal structures are unremarkable. IMPRESSION: No active  cardiopulmonary disease. Electronically Signed   By: Aram Candela M.D.   On: 11/30/2022 20:02    Procedures Procedures    Medications Ordered in ED Medications - No data to display  ED Course/ Medical Decision Making/ A&P                                 Medical Decision Making Differential diagnose includes but not limited to PE, muscle strain, dissection, pneumonia, ACS, other  ED course: Patient presents to ER for left thoracic pain and left chest pain ongoing for a couple of weeks but is getting worse.  Will opt for CT scan at this time to definitively rule out PE or other acute intrathoracic process otherwise ACS ruled out with her 2 negative troponins, her pain is not exertional or typical.  Do not feel this was cardiac related at this time.  Will give her some Robaxin and Lidoderm patch for pain control.  Pending CTA results.  Signed out to Dr. Preston Fleeting, if negative will anticipate discharge with supportive care and close PCP follow-up  Amount and/or Complexity of Data Reviewed Labs: ordered. Radiology: ordered.           Final Clinical Impression(s) / ED Diagnoses Final diagnoses:  None    Rx / DC Orders ED Discharge Orders     None         Josem Kaufmann 11/30/22 2318    Vanetta Mulders, MD 12/04/22 [phone]

## 2022-12-13 DIAGNOSIS — M791 Myalgia, unspecified site: Secondary | ICD-10-CM | POA: Diagnosis not present

## 2022-12-13 DIAGNOSIS — Z299 Encounter for prophylactic measures, unspecified: Secondary | ICD-10-CM | POA: Diagnosis not present

## 2022-12-13 DIAGNOSIS — M549 Dorsalgia, unspecified: Secondary | ICD-10-CM | POA: Diagnosis not present

## 2022-12-13 DIAGNOSIS — R1013 Epigastric pain: Secondary | ICD-10-CM | POA: Diagnosis not present

## 2022-12-13 DIAGNOSIS — I10 Essential (primary) hypertension: Secondary | ICD-10-CM | POA: Diagnosis not present

## 2022-12-24 DIAGNOSIS — Z79899 Other long term (current) drug therapy: Secondary | ICD-10-CM | POA: Diagnosis not present

## 2022-12-24 DIAGNOSIS — Z7189 Other specified counseling: Secondary | ICD-10-CM | POA: Diagnosis not present

## 2022-12-24 DIAGNOSIS — R1013 Epigastric pain: Secondary | ICD-10-CM | POA: Diagnosis not present

## 2022-12-24 DIAGNOSIS — Z0000 Encounter for general adult medical examination without abnormal findings: Secondary | ICD-10-CM | POA: Diagnosis not present

## 2022-12-24 DIAGNOSIS — E039 Hypothyroidism, unspecified: Secondary | ICD-10-CM | POA: Diagnosis not present

## 2022-12-24 DIAGNOSIS — R5383 Other fatigue: Secondary | ICD-10-CM | POA: Diagnosis not present

## 2022-12-24 DIAGNOSIS — E7800 Pure hypercholesterolemia, unspecified: Secondary | ICD-10-CM | POA: Diagnosis not present

## 2022-12-24 DIAGNOSIS — I10 Essential (primary) hypertension: Secondary | ICD-10-CM | POA: Diagnosis not present

## 2022-12-24 DIAGNOSIS — Z1339 Encounter for screening examination for other mental health and behavioral disorders: Secondary | ICD-10-CM | POA: Diagnosis not present

## 2022-12-24 DIAGNOSIS — Z1331 Encounter for screening for depression: Secondary | ICD-10-CM | POA: Diagnosis not present

## 2022-12-24 DIAGNOSIS — Z299 Encounter for prophylactic measures, unspecified: Secondary | ICD-10-CM | POA: Diagnosis not present

## 2022-12-24 DIAGNOSIS — K760 Fatty (change of) liver, not elsewhere classified: Secondary | ICD-10-CM | POA: Diagnosis not present

## 2022-12-24 DIAGNOSIS — E119 Type 2 diabetes mellitus without complications: Secondary | ICD-10-CM | POA: Diagnosis not present

## 2022-12-30 DIAGNOSIS — R42 Dizziness and giddiness: Secondary | ICD-10-CM | POA: Diagnosis not present

## 2022-12-30 DIAGNOSIS — G939 Disorder of brain, unspecified: Secondary | ICD-10-CM | POA: Diagnosis not present

## 2022-12-30 DIAGNOSIS — I10 Essential (primary) hypertension: Secondary | ICD-10-CM | POA: Diagnosis not present

## 2022-12-30 DIAGNOSIS — I6522 Occlusion and stenosis of left carotid artery: Secondary | ICD-10-CM | POA: Diagnosis not present

## 2022-12-30 DIAGNOSIS — I671 Cerebral aneurysm, nonruptured: Secondary | ICD-10-CM | POA: Diagnosis not present

## 2022-12-30 DIAGNOSIS — N189 Chronic kidney disease, unspecified: Secondary | ICD-10-CM | POA: Diagnosis not present

## 2022-12-31 DIAGNOSIS — I10 Essential (primary) hypertension: Secondary | ICD-10-CM | POA: Diagnosis not present

## 2022-12-31 DIAGNOSIS — E119 Type 2 diabetes mellitus without complications: Secondary | ICD-10-CM | POA: Diagnosis not present

## 2022-12-31 DIAGNOSIS — R42 Dizziness and giddiness: Secondary | ICD-10-CM | POA: Diagnosis not present

## 2022-12-31 DIAGNOSIS — G939 Disorder of brain, unspecified: Secondary | ICD-10-CM | POA: Diagnosis not present

## 2023-01-01 DIAGNOSIS — H81399 Other peripheral vertigo, unspecified ear: Secondary | ICD-10-CM | POA: Diagnosis not present

## 2023-01-01 DIAGNOSIS — E119 Type 2 diabetes mellitus without complications: Secondary | ICD-10-CM | POA: Diagnosis not present

## 2023-01-01 DIAGNOSIS — R42 Dizziness and giddiness: Secondary | ICD-10-CM | POA: Diagnosis not present

## 2023-01-01 DIAGNOSIS — I10 Essential (primary) hypertension: Secondary | ICD-10-CM | POA: Diagnosis not present

## 2023-01-02 DIAGNOSIS — I358 Other nonrheumatic aortic valve disorders: Secondary | ICD-10-CM | POA: Diagnosis not present

## 2023-01-02 DIAGNOSIS — I3139 Other pericardial effusion (noninflammatory): Secondary | ICD-10-CM | POA: Diagnosis not present

## 2023-01-02 DIAGNOSIS — E119 Type 2 diabetes mellitus without complications: Secondary | ICD-10-CM | POA: Diagnosis not present

## 2023-01-02 DIAGNOSIS — I340 Nonrheumatic mitral (valve) insufficiency: Secondary | ICD-10-CM | POA: Diagnosis not present

## 2023-01-02 DIAGNOSIS — I517 Cardiomegaly: Secondary | ICD-10-CM | POA: Diagnosis not present

## 2023-01-02 DIAGNOSIS — R42 Dizziness and giddiness: Secondary | ICD-10-CM | POA: Diagnosis not present

## 2023-01-02 DIAGNOSIS — I10 Essential (primary) hypertension: Secondary | ICD-10-CM | POA: Diagnosis not present

## 2023-01-02 DIAGNOSIS — I361 Nonrheumatic tricuspid (valve) insufficiency: Secondary | ICD-10-CM | POA: Diagnosis not present

## 2023-01-10 DIAGNOSIS — J029 Acute pharyngitis, unspecified: Secondary | ICD-10-CM | POA: Diagnosis not present

## 2023-01-10 DIAGNOSIS — I10 Essential (primary) hypertension: Secondary | ICD-10-CM | POA: Diagnosis not present

## 2023-01-10 DIAGNOSIS — Z299 Encounter for prophylactic measures, unspecified: Secondary | ICD-10-CM | POA: Diagnosis not present

## 2023-01-10 DIAGNOSIS — R0989 Other specified symptoms and signs involving the circulatory and respiratory systems: Secondary | ICD-10-CM | POA: Diagnosis not present

## 2023-01-10 DIAGNOSIS — N1832 Chronic kidney disease, stage 3b: Secondary | ICD-10-CM | POA: Diagnosis not present

## 2023-01-10 DIAGNOSIS — R42 Dizziness and giddiness: Secondary | ICD-10-CM | POA: Diagnosis not present

## 2023-01-10 DIAGNOSIS — E039 Hypothyroidism, unspecified: Secondary | ICD-10-CM | POA: Diagnosis not present

## 2023-01-29 DIAGNOSIS — E1122 Type 2 diabetes mellitus with diabetic chronic kidney disease: Secondary | ICD-10-CM | POA: Diagnosis not present

## 2023-01-29 DIAGNOSIS — I10 Essential (primary) hypertension: Secondary | ICD-10-CM | POA: Diagnosis not present

## 2023-01-29 DIAGNOSIS — J449 Chronic obstructive pulmonary disease, unspecified: Secondary | ICD-10-CM | POA: Diagnosis not present

## 2023-01-29 DIAGNOSIS — E1165 Type 2 diabetes mellitus with hyperglycemia: Secondary | ICD-10-CM | POA: Diagnosis not present

## 2023-01-29 DIAGNOSIS — Z299 Encounter for prophylactic measures, unspecified: Secondary | ICD-10-CM | POA: Diagnosis not present

## 2023-01-29 DIAGNOSIS — N1832 Chronic kidney disease, stage 3b: Secondary | ICD-10-CM | POA: Diagnosis not present

## 2023-02-05 ENCOUNTER — Other Ambulatory Visit: Payer: Self-pay | Admitting: "Endocrinology

## 2023-02-05 DIAGNOSIS — E038 Other specified hypothyroidism: Secondary | ICD-10-CM

## 2023-02-06 LAB — TSH: TSH: 2.81 u[IU]/mL (ref 0.450–4.500)

## 2023-02-06 LAB — T4, FREE: Free T4: 1.29 ng/dL (ref 0.82–1.77)

## 2023-02-12 ENCOUNTER — Encounter: Payer: Self-pay | Admitting: "Endocrinology

## 2023-02-12 ENCOUNTER — Ambulatory Visit (INDEPENDENT_AMBULATORY_CARE_PROVIDER_SITE_OTHER): Payer: Medicare PPO | Admitting: "Endocrinology

## 2023-02-12 VITALS — BP 122/60 | HR 92 | Ht 67.5 in | Wt 206.6 lb

## 2023-02-12 DIAGNOSIS — E118 Type 2 diabetes mellitus with unspecified complications: Secondary | ICD-10-CM | POA: Diagnosis not present

## 2023-02-12 DIAGNOSIS — I10 Essential (primary) hypertension: Secondary | ICD-10-CM

## 2023-02-12 DIAGNOSIS — N1831 Chronic kidney disease, stage 3a: Secondary | ICD-10-CM | POA: Diagnosis not present

## 2023-02-12 DIAGNOSIS — E782 Mixed hyperlipidemia: Secondary | ICD-10-CM

## 2023-02-12 DIAGNOSIS — E1122 Type 2 diabetes mellitus with diabetic chronic kidney disease: Secondary | ICD-10-CM | POA: Diagnosis not present

## 2023-02-12 DIAGNOSIS — E038 Other specified hypothyroidism: Secondary | ICD-10-CM | POA: Diagnosis not present

## 2023-02-12 DIAGNOSIS — Z7984 Long term (current) use of oral hypoglycemic drugs: Secondary | ICD-10-CM | POA: Diagnosis not present

## 2023-02-12 NOTE — Progress Notes (Signed)
 02/12/2023   Endocrinology follow-up note   Subjective:    Patient ID: Dana Lawson, female    DOB: 1945/06/19, PCP Maree Isles, MD   Past Medical History:  Diagnosis Date   Anxiety    COPD (chronic obstructive pulmonary disease) (HCC)    Depression    Essential hypertension    GERD (gastroesophageal reflux disease)    Hepatic steatosis    Hyperlipidemia    Hypothyroidism    Type 2 diabetes mellitus (HCC)    Past Surgical History:  Procedure Laterality Date   BREAST BIOPSY Left    per pt benign   LEFT HEART CATH AND CORONARY ANGIOGRAPHY N/A 11/08/2016   Procedure: LEFT HEART CATH AND CORONARY ANGIOGRAPHY;  Surgeon: Anner Alm ORN, MD;  Location: [facility] INVASIVE CV LAB;  Service: Cardiovascular;  Laterality: N/A;   TUBAL LIGATION     Social History   Socioeconomic History   Marital status: Married    Spouse name: Not on file   Number of children: Not on file   Years of education: Not on file   Highest education level: Not on file  Occupational History   Not on file  Tobacco Use   Smoking status: Former    Current packs/day: 0.00    Types: Cigarettes    Quit date: 11/06/2015    Years since quitting: 7.2   Smokeless tobacco: Never  Vaping Use   Vaping status: Never Used  Substance and Sexual Activity   Alcohol  use: Not Currently    Comment: Occasional   Drug use: No   Sexual activity: Not on file  Other Topics Concern   Not on file  Social History Narrative   Not on file   Social Drivers of Health   Financial Resource Strain: Not on file  Food Insecurity: No Food Insecurity (12/31/2022)   Received from [facility]   Hunger Vital Sign    Worried About Running Out of Food in the Last Year: Never true    Ran Out of Food in the Last Year: Never true  Transportation Needs: No Transportation Needs (12/31/2022)   Received from [facility]   PRAPARE - Transportation    Lack of Transportation (Medical): No    Lack of Transportation (Non-Medical): No  Physical  Activity: Not on file  Stress: Not on file  Social Connections: Not on file   Outpatient Encounter Medications as of 02/12/2023  Medication Sig   levothyroxine  (SYNTHROID ) 112 MCG tablet Take 112 mcg by mouth daily before breakfast.   NIFEdipine (PROCARDIA-XL/NIFEDICAL-XL) 30 MG 24 hr tablet Take 30 mg by mouth daily.   aspirin  81 MG tablet Take 81 mg by mouth daily.   azithromycin (ZITHROMAX) 250 MG tablet Take 250 mg by mouth as directed.   benazepril (LOTENSIN) 40 MG tablet Take 40 mg by mouth daily.   Blood Glucose Monitoring Suppl (FREESTYLE LITE) DEVI 1 each by Does not apply route 2 (two) times daily. Use as instructed to check blood glucose two times a day.   buPROPion (WELLBUTRIN SR) 150 MG 12 hr tablet Take 150 mg by mouth 2 (two) times daily.   chlorhexidine (PERIDEX) 0.12 % solution Use as directed 5 mLs in the mouth or throat 2 (two) times a week.    Coenzyme Q10 (CO Q 10) 100 MG CAPS Take 100 mg by mouth daily.   cyclobenzaprine  (FLEXERIL ) 10 MG tablet Take 1 tablet (10 mg total) by mouth 2 (two) times daily as needed for muscle spasms.  empagliflozin  (JARDIANCE ) 25 MG TABS tablet Take 1 tablet (25 mg total) by mouth daily before breakfast.   febuxostat (ULORIC) 40 MG tablet Take 40 mg by mouth daily.   furosemide  (LASIX ) 40 MG tablet Take 40 mg by mouth daily.    glucose blood (ACCU-CHEK GUIDE) test strip Use as instructed   glucose blood test strip 1 each by Other route 2 (two) times daily. Use as instructed bid. E11.65   Lancets (FREESTYLE) lancets Use as instructed to check blood glucose twice daily   lidocaine  (LIDODERM ) 5 % Place 1 patch onto the skin daily. Remove & Discard patch within 12 hours or as directed by MD   omeprazole (PRILOSEC) 20 MG capsule Take 20 mg by mouth daily.   ondansetron  (ZOFRAN ) 4 MG tablet Take 4 mg by mouth every 8 (eight) hours as needed.   potassium chloride (K-DUR) 10 MEQ tablet Take 10 mEq by mouth daily. (Patient not taking: Reported on  02/12/2023)   Probiotic Product (PROBIOTIC DAILY PO) Take 1 tablet by mouth daily. Woman's   rosuvastatin (CRESTOR) 5 MG tablet Take 5 mg by mouth daily.   Semaglutide , 1 MG/DOSE, 4 MG/3ML SOPN Inject 1 mg as directed once a week. (Patient taking differently: Inject 1 mg as directed once a week. Wednesday)   traMADol (ULTRAM) 50 MG tablet Take 50 mg by mouth 3 (three) times daily as needed.   [DISCONTINUED] levothyroxine  (SYNTHROID ) 150 MCG tablet TAKE 1 TABLET DAILY BEFORE BREAKFAST   No facility-administered encounter medications on file as of 02/12/2023.   ALLERGIES: Allergies  Allergen Reactions   Metronidazole Other (See Comments)    Shortness of breath   VACCINATION STATUS: Immunization History  Administered Date(s) Administered   Moderna Sars-Covid-2 Vaccination 03/14/2019, 04/15/2019    Diabetes She presents for her follow-up diabetic visit. She has type 2 diabetes mellitus. Onset time: She was diagnosed at approximate age of 43 years. Her disease course has been worsening. There are no hypoglycemic associated symptoms. There are no diabetic associated symptoms. There are no hypoglycemic complications. Symptoms are worsening. Diabetic complications include nephropathy. Risk factors for coronary artery disease include diabetes mellitus, dyslipidemia, hypertension, obesity, sedentary lifestyle, post-menopausal and tobacco exposure. Current diabetic treatments: She is currently on empiric 1 mg subcutaneously weekly, Jardiance  25 mg p.o. daily. Her weight is decreasing steadily. She is following a generally unhealthy diet. When asked about meal planning, she reported none. She has not had a previous visit with a dietitian. She rarely participates in exercise. Her home blood glucose trend is increasing steadily. (She presents without any meter nor logs.  Her previsit A1c was 8.9% increasing from 7.3% during her last visit.     ) An ACE inhibitor/angiotensin II receptor blocker is being taken.  She does not see a podiatrist.Eye exam is not current.   Review of Systems    Review of systems  Constitutional: + Minimally fluctuating body weight,  current  Body mass index is 31.88 kg/m. , no fatigue, no subjective hyperthermia, no subjective hypothermia    Objective:    BP 122/60   Pulse 92   Ht 5' 7.5 (1.715 m)   Wt 206 lb 9.6 oz (93.7 kg)   BMI 31.88 kg/m   Wt Readings from Last 3 Encounters:  02/12/23 206 lb 9.6 oz (93.7 kg)  11/30/22 212 lb (96.2 kg)  11/21/22 211 lb (95.7 kg)    Physical Exam  Physical Exam- Limited  Constitutional:  Body mass index is 31.88 kg/m. , not in  acute distress, normal state of mind    Recent Results (from the past 2160 hours)  Basic metabolic panel     Status: Abnormal   Collection Time: 11/21/22  4:21 PM  Result Value Ref Range   Sodium 135 135 - 145 mmol/L   Potassium 4.5 3.5 - 5.1 mmol/L   Chloride 99 98 - 111 mmol/L   CO2 26 22 - 32 mmol/L   Glucose, Bld 184 (H) 70 - 99 mg/dL    Comment: Glucose reference range applies only to samples taken after fasting for at least 8 hours.   BUN 25 (H) 8 - 23 mg/dL   Creatinine, Ser 8.56 (H) 0.44 - 1.00 mg/dL   Calcium 9.2 8.9 - 89.6 mg/dL   GFR, Estimated 38 (L) >60 mL/min    Comment: (NOTE) Calculated using the CKD-EPI Creatinine Equation (2021)    Anion gap 10 5 - 15    Comment: Performed at [facility], [address] [place], [place] [address]  CBC     Status: Abnormal   Collection Time: 11/21/22  4:21 PM  Result Value Ref Range   WBC 6.4 4.0 - 10.5 K/uL   RBC 4.81 3.87 - 5.11 MIL/uL   Hemoglobin 13.6 12.0 - 15.0 g/dL   HCT 55.5 63.9 - 53.9 %   MCV 92.3 80.0 - 100.0 fL   MCH 28.3 26.0 - 34.0 pg   MCHC 30.6 30.0 - 36.0 g/dL   RDW 84.3 (H) 88.4 - 84.4 %   Platelets 195 150 - 400 K/uL   nRBC 0.0 0.0 - 0.2 %    Comment: Performed at [facility], [address] [place], [place] [address]  Troponin I (High Sensitivity)     Status: None   Collection Time: 11/21/22  4:21  PM  Result Value Ref Range   Troponin I (High Sensitivity) 5 <18 ng/L    Comment: (NOTE) Elevated high sensitivity troponin I (hsTnI) values and significant  changes across serial measurements may suggest ACS but many other  chronic and acute conditions are known to elevate hsTnI results.  Refer to the Links section for chest pain algorithms and additional  guidance. Performed at [facility], [address], [place], [place] [address]   D-dimer, quantitative     Status: None   Collection Time: 11/21/22  4:21 PM  Result Value Ref Range   D-Dimer, Quant 0.37 0.00 - 0.50 ug/mL-FEU    Comment: (NOTE) At the manufacturer cut-off value of 0.5 g/mL FEU, this assay has a negative predictive value of 95-100%.This assay is intended for use in conjunction with a clinical pretest probability (PTP) assessment model to exclude pulmonary embolism (PE) and deep venous thrombosis (DVT) in outpatients suspected of PE or DVT. Results should be correlated with clinical presentation. Performed at [facility], [address], [place], [place] [address]   Basic metabolic panel     Status: Abnormal   Collection Time: 11/30/22  7:31 PM  Result Value Ref Range   Sodium 135 135 - 145 mmol/L   Potassium 4.8 3.5 - 5.1 mmol/L   Chloride 101 98 - 111 mmol/L   CO2 28 22 - 32 mmol/L   Glucose, Bld 144 (H) 70 - 99 mg/dL    Comment: Glucose reference range applies only to samples taken after fasting for at least 8 hours.   BUN 21 8 - 23 mg/dL   Creatinine, Ser 8.89 (H) 0.44 - 1.00 mg/dL   Calcium 9.7 8.9 - 89.6 mg/dL  GFR, Estimated 53 (L) >60 mL/min    Comment: (NOTE) Calculated using the CKD-EPI Creatinine Equation (2021)    Anion gap 6 5 - 15    Comment: Performed at [facility], [address] [place], [place] [address]  CBC     Status: Abnormal   Collection Time: 11/30/22  7:31 PM  Result Value Ref Range   WBC 7.6 4.0 - 10.5 K/uL   RBC 5.00 3.87 - 5.11 MIL/uL   Hemoglobin 13.7 12.0 - 15.0 g/dL    HCT 53.1 (H) 63.9 - 46.0 %   MCV 93.6 80.0 - 100.0 fL   MCH 27.4 26.0 - 34.0 pg   MCHC 29.3 (L) 30.0 - 36.0 g/dL   RDW 84.6 88.4 - 84.4 %   Platelets 200 150 - 400 K/uL   nRBC 0.0 0.0 - 0.2 %    Comment: Performed at [facility], [address], [place], [place] [address]  Troponin I (High Sensitivity)     Status: None   Collection Time: 11/30/22  7:31 PM  Result Value Ref Range   Troponin I (High Sensitivity) 4 <18 ng/L    Comment: (NOTE) Elevated high sensitivity troponin I (hsTnI) values and significant  changes across serial measurements may suggest ACS but many other  chronic and acute conditions are known to elevate hsTnI results.  Refer to the Links section for chest pain algorithms and additional  guidance. Performed at [facility], [address], [place], [place] [address]   Troponin I (High Sensitivity)     Status: None   Collection Time: 11/30/22  9:15 PM  Result Value Ref Range   Troponin I (High Sensitivity) 5 <18 ng/L    Comment: (NOTE) Elevated high sensitivity troponin I (hsTnI) values and significant  changes across serial measurements may suggest ACS but many other  chronic and acute conditions are known to elevate hsTnI results.  Refer to the Links section for chest pain algorithms and additional  guidance. Performed at [facility], [address] [place], [place] [address]   CBG monitoring, ED     Status: Abnormal   Collection Time: 11/30/22 10:42 PM  Result Value Ref Range   Glucose-Capillary 123 (H) 70 - 99 mg/dL    Comment: Glucose reference range applies only to samples taken after fasting for at least 8 hours.  TSH     Status: None   Collection Time: 02/05/23  2:49 PM  Result Value Ref Range   TSH 2.810 0.450 - 4.500 uIU/mL  T4, free     Status: None   Collection Time: 02/05/23  2:49 PM  Result Value Ref Range   Free T4 1.29 0.82 - 1.77 ng/dL   Lipid Panel     Component Value Date/Time   CHOL 164 08/01/2022 1534   TRIG 367 (H)  08/01/2022 1534   HDL 45 08/01/2022 1534   CHOLHDL 3.6 08/01/2022 1534   CHOLHDL 4.2 09/12/2021 1432   LDLCALC 62 08/01/2022 1534   LDLCALC 90 09/12/2021 1432   LABVLDL 57 (H) 08/01/2022 1534   -She is documented to have multinodular goiter. Thyroid  ultrasound in 8/22   Right lobe: 3.8 x 1.8 x 1.5 cm,  Left lobe: 4.4 x 2.5 x 1.3 cm  IMPRESSION: Solitary left thyroid  nodule demonstrates minimal growth since 2013 which indicates a benign etiology. Given the location of this nodule, it could also represent a parathyroid adenoma.   Assessment & Plan:   1. Hypothyroidism -Her thyroid  function tests are consistent with  appropriate replacement.  She is advised to continue levothyroxine  112 mcg p.o. daily before breakfast.  This dose was adjusted when she was hospitalized in [place] .  She presents with 20 pounds of weight loss compared to last year .  - We discussed about the correct intake of her thyroid  hormone, on empty stomach at fasting, with water, separated by at least 30 minutes from breakfast and other medications,  and separated by more than 4 hours from calcium, iron, multivitamins, acid reflux medications (PPIs). -Patient is made aware of the fact that thyroid  hormone replacement is needed for life, dose to be adjusted by periodic monitoring of thyroid  function tests.  2.  Type 2 diabetes-    She presents without any meter nor logs.  Her previsit A1c was 8.9% increasing from 7.3% during her last visit.    She is benefiting from GLP-1 receptor agonist.  She is advised to start monitoring blood glucose at least twice a day-daily before breakfast and at bedtime, and encouraged to call clinic for hypoglycemia under 70 or hyperglycemia above 150 mg per DL at fasting.    In the meantime, she is advised to continue Jardiance  25 mg p.o. daily at breakfast.  She is also advised to continue Ozempic  1 mg subcutaneously weekly.   - she acknowledges that there is a room for improvement in  her food and drink choices. - Suggestion is made for her to avoid simple carbohydrates  from her diet including Cakes, Sweet Desserts, Ice Cream, Soda (diet and regular), Sweet Tea, Candies, Chips, Cookies, Store Bought Juices, Alcohol  in Excess of  1-2 drinks a day, Artificial Sweeteners,  Coffee Creamer, and Sugar-free Products, Lemonade. This will help patient to have more stable blood glucose profile and potentially avoid unintended weight gain.  3) hypertension:  -Her blood pressure is controlled to target.  her urine microalbumin is normal today.  She is currently on benazepril 40 mg p.o. daily.  She has benefited from addition of hydrochlorothiazide  25 mg p.o. daily at breakfast.  She remains on benazepril 40 mg p.o. daily at breakfast as well.     4) hyperlipidemia:  Her triglycerides are improving after she was started on fenofibrate .  She is improving her LDL to 62.  She is advised to continue Crestor 5 mg p.o. nightly along with fenofibrate  48 mg p.o. daily at bedtime.    Side effects and precautions discussed with her.  5) Multinodular goiter : She will not need any antithyroid intervention at this time.  See above for hypothyroidism.  She recently had normal ABIs.  This study will be repeated in May 2027, or sooner if needed.   - I advised patient to maintain close follow up with Maree Isles, MD for primary care needs.  I spent  25  minutes in the care of the patient today including review of labs from Thyroid  Function, CMP, and other relevant labs ; imaging/biopsy records (current and previous including abstractions from other facilities); face-to-face time discussing  her lab results and symptoms, medications doses, her options of short and long term treatment based on the latest standards of care / guidelines;   and documenting the encounter.  Dana Lawson  participated in the discussions, expressed understanding, and voiced agreement with the above plans.  All questions  were answered to her satisfaction. she is encouraged to contact clinic should she have any questions or concerns prior to her return visit.    Follow up plan: Return in about 3 months (around  05/12/2023) for F/U with Pre-visit Labs, Meter/CGM/Logs, A1c here.  Ranny Earl, MD Phone: [phone]  Fax: [phone]  This note was partially dictated with voice recognition software. Similar sounding words can be transcribed inadequately or may not  be corrected upon review.  02/12/2023, 5:34 PM

## 2023-02-12 NOTE — Patient Instructions (Signed)

## 2023-02-18 ENCOUNTER — Other Ambulatory Visit (HOSPITAL_COMMUNITY): Payer: Self-pay | Admitting: Nephrology

## 2023-02-18 DIAGNOSIS — N1831 Chronic kidney disease, stage 3a: Secondary | ICD-10-CM

## 2023-02-18 DIAGNOSIS — E1122 Type 2 diabetes mellitus with diabetic chronic kidney disease: Secondary | ICD-10-CM

## 2023-02-22 ENCOUNTER — Ambulatory Visit (HOSPITAL_COMMUNITY)
Admission: RE | Admit: 2023-02-22 | Discharge: 2023-02-22 | Disposition: A | Discharge status: Home / Self Care | Payer: Medicare PPO | Source: Ambulatory Visit | Attending: Nephrology | Admitting: Nephrology

## 2023-02-22 DIAGNOSIS — E1122 Type 2 diabetes mellitus with diabetic chronic kidney disease: Secondary | ICD-10-CM | POA: Diagnosis not present

## 2023-02-22 DIAGNOSIS — N1831 Chronic kidney disease, stage 3a: Secondary | ICD-10-CM | POA: Diagnosis not present

## 2023-02-22 DIAGNOSIS — I10 Essential (primary) hypertension: Secondary | ICD-10-CM | POA: Diagnosis not present

## 2023-02-22 DIAGNOSIS — N189 Chronic kidney disease, unspecified: Secondary | ICD-10-CM | POA: Diagnosis not present

## 2023-02-22 DIAGNOSIS — N1330 Unspecified hydronephrosis: Secondary | ICD-10-CM | POA: Diagnosis not present

## 2023-02-26 DIAGNOSIS — E1165 Type 2 diabetes mellitus with hyperglycemia: Secondary | ICD-10-CM | POA: Diagnosis not present

## 2023-02-26 DIAGNOSIS — I10 Essential (primary) hypertension: Secondary | ICD-10-CM | POA: Diagnosis not present

## 2023-02-26 DIAGNOSIS — M5430 Sciatica, unspecified side: Secondary | ICD-10-CM | POA: Diagnosis not present

## 2023-02-26 DIAGNOSIS — Z299 Encounter for prophylactic measures, unspecified: Secondary | ICD-10-CM | POA: Diagnosis not present

## 2023-03-12 DIAGNOSIS — N2581 Secondary hyperparathyroidism of renal origin: Secondary | ICD-10-CM | POA: Diagnosis not present

## 2023-03-12 DIAGNOSIS — N1831 Chronic kidney disease, stage 3a: Secondary | ICD-10-CM | POA: Diagnosis not present

## 2023-03-12 DIAGNOSIS — E1122 Type 2 diabetes mellitus with diabetic chronic kidney disease: Secondary | ICD-10-CM | POA: Diagnosis not present

## 2023-03-12 DIAGNOSIS — I10 Essential (primary) hypertension: Secondary | ICD-10-CM | POA: Diagnosis not present

## 2023-03-26 DIAGNOSIS — E1169 Type 2 diabetes mellitus with other specified complication: Secondary | ICD-10-CM | POA: Diagnosis not present

## 2023-03-26 DIAGNOSIS — I10 Essential (primary) hypertension: Secondary | ICD-10-CM | POA: Diagnosis not present

## 2023-03-26 DIAGNOSIS — R52 Pain, unspecified: Secondary | ICD-10-CM | POA: Diagnosis not present

## 2023-03-26 DIAGNOSIS — Z299 Encounter for prophylactic measures, unspecified: Secondary | ICD-10-CM | POA: Diagnosis not present

## 2023-03-26 DIAGNOSIS — M5430 Sciatica, unspecified side: Secondary | ICD-10-CM | POA: Diagnosis not present

## 2023-04-10 ENCOUNTER — Other Ambulatory Visit: Payer: Self-pay

## 2023-04-10 DIAGNOSIS — E118 Type 2 diabetes mellitus with unspecified complications: Secondary | ICD-10-CM

## 2023-04-10 MED ORDER — SEMAGLUTIDE (1 MG/DOSE) 4 MG/3ML ~~LOC~~ SOPN: 1.0000 mg | PEN_INJECTOR | SUBCUTANEOUS | 0 refills | Status: DC

## 2023-05-10 DIAGNOSIS — E782 Mixed hyperlipidemia: Secondary | ICD-10-CM | POA: Diagnosis not present

## 2023-05-10 DIAGNOSIS — E038 Other specified hypothyroidism: Secondary | ICD-10-CM | POA: Diagnosis not present

## 2023-05-10 DIAGNOSIS — E118 Type 2 diabetes mellitus with unspecified complications: Secondary | ICD-10-CM | POA: Diagnosis not present

## 2023-05-11 LAB — LIPID PANEL
Chol/HDL Ratio: 2.8 ratio (ref 0.0–4.4)
Cholesterol, Total: 121 mg/dL (ref 100–199)
HDL: 43 mg/dL (ref >39)
LDL Chol Calc (NIH): 44 mg/dL (ref 0–99)
Triglycerides: 212 mg/dL — ABNORMAL HIGH (ref 0–149)
VLDL Cholesterol Cal: 34 mg/dL (ref 5–40)

## 2023-05-11 LAB — COMPREHENSIVE METABOLIC PANEL WITH GFR
ALT: 29 IU/L (ref 0–32)
AST: 21 IU/L (ref 0–40)
Albumin: 4.7 g/dL (ref 3.8–4.8)
Alkaline Phosphatase: 108 IU/L (ref 44–121)
BUN/Creatinine Ratio: 11 — ABNORMAL LOW (ref 12–28)
BUN: 13 mg/dL (ref 8–27)
Bilirubin Total: 0.5 mg/dL (ref 0.0–1.2)
CO2: 23 mmol/L (ref 20–29)
Calcium: 10 mg/dL (ref 8.7–10.3)
Chloride: 104 mmol/L (ref 96–106)
Creatinine, Ser: 1.17 mg/dL — ABNORMAL HIGH (ref 0.57–1.00)
Globulin, Total: 2.5 g/dL (ref 1.5–4.5)
Glucose: 108 mg/dL — ABNORMAL HIGH (ref 70–99)
Potassium: 4.8 mmol/L (ref 3.5–5.2)
Sodium: 142 mmol/L (ref 134–144)
Total Protein: 7.2 g/dL (ref 6.0–8.5)
eGFR: 49 mL/min/{1.73_m2} — ABNORMAL LOW (ref >59)

## 2023-05-11 LAB — T4, FREE: Free T4: 1.35 ng/dL (ref 0.82–1.77)

## 2023-05-11 LAB — TSH: TSH: 2.57 u[IU]/mL (ref 0.450–4.500)

## 2023-05-16 ENCOUNTER — Encounter: Payer: Self-pay | Admitting: "Endocrinology

## 2023-05-16 ENCOUNTER — Ambulatory Visit: Payer: Medicare PPO | Admitting: "Endocrinology

## 2023-05-16 VITALS — BP 128/76 | HR 68 | Ht 67.5 in | Wt 199.0 lb

## 2023-05-16 DIAGNOSIS — E118 Type 2 diabetes mellitus with unspecified complications: Secondary | ICD-10-CM

## 2023-05-16 DIAGNOSIS — E119 Type 2 diabetes mellitus without complications: Secondary | ICD-10-CM | POA: Diagnosis not present

## 2023-05-16 DIAGNOSIS — E782 Mixed hyperlipidemia: Secondary | ICD-10-CM

## 2023-05-16 DIAGNOSIS — I10 Essential (primary) hypertension: Secondary | ICD-10-CM | POA: Diagnosis not present

## 2023-05-16 DIAGNOSIS — Z7984 Long term (current) use of oral hypoglycemic drugs: Secondary | ICD-10-CM | POA: Diagnosis not present

## 2023-05-16 DIAGNOSIS — Z7985 Long-term (current) use of injectable non-insulin antidiabetic drugs: Secondary | ICD-10-CM | POA: Diagnosis not present

## 2023-05-16 DIAGNOSIS — E038 Other specified hypothyroidism: Secondary | ICD-10-CM

## 2023-05-16 LAB — POCT UA - MICROALBUMIN
Albumin/Creatinine Ratio, Urine, POC: <30
Creatinine, POC: 200 mg/dL
Microalbumin Ur, POC: 30 mg/L

## 2023-05-16 LAB — POCT GLYCOSYLATED HEMOGLOBIN (HGB A1C): HbA1c, POC (controlled diabetic range): 7.5 % — AB (ref 0.0–7.0)

## 2023-05-16 MED ORDER — OZEMPIC (2 MG/DOSE) 8 MG/3ML ~~LOC~~ SOPN: 2.0000 mg | PEN_INJECTOR | SUBCUTANEOUS | 3 refills | Status: DC

## 2023-05-16 NOTE — Patient Instructions (Signed)

## 2023-05-16 NOTE — Progress Notes (Signed)
 05/16/2023   Endocrinology follow-up note   Subjective:    Patient ID: Dana Lawson, female    DOB: 1945/05/22, PCP Theoplis Fix, MD   Past Medical History:  Diagnosis Date   Anxiety    COPD (chronic obstructive pulmonary disease) (HCC)    Depression    Essential hypertension    GERD (gastroesophageal reflux disease)    Hepatic steatosis    Hyperlipidemia    Hypothyroidism    Type 2 diabetes mellitus (HCC)    Past Surgical History:  Procedure Laterality Date   BREAST BIOPSY Left    per pt benign   LEFT HEART CATH AND CORONARY ANGIOGRAPHY N/A 11/08/2016   Procedure: LEFT HEART CATH AND CORONARY ANGIOGRAPHY;  Surgeon: Arleen Lacer, MD;  Location: [facility] INVASIVE CV LAB;  Service: Cardiovascular;  Laterality: N/A;   TUBAL LIGATION     Social History   Socioeconomic History   Marital status: Married    Spouse name: Not on file   Number of children: Not on file   Years of education: Not on file   Highest education level: Not on file  Occupational History   Not on file  Tobacco Use   Smoking status: Former    Current packs/day: 0.00    Types: Cigarettes    Quit date: 11/06/2015    Years since quitting: 7.5   Smokeless tobacco: Never  Vaping Use   Vaping status: Never Used  Substance and Sexual Activity   Alcohol  use: Not Currently    Comment: Occasional   Drug use: No   Sexual activity: Not on file  Other Topics Concern   Not on file  Social History Narrative   Not on file   Social Drivers of Health   Financial Resource Strain: Not on file  Food Insecurity: No Food Insecurity (12/31/2022)   Received from [facility]   Hunger Vital Sign    Worried About Running Out of Food in the Last Year: Never true    Ran Out of Food in the Last Year: Never true  Transportation Needs: No Transportation Needs (12/31/2022)   Received from [facility]   PRAPARE - Transportation    Lack of Transportation (Medical): No    Lack of Transportation (Non-Medical): No  Physical  Activity: Not on file  Stress: Not on file  Social Connections: Not on file   Outpatient Encounter Medications as of 05/16/2023  Medication Sig   Semaglutide , 2 MG/DOSE, (OZEMPIC , 2 MG/DOSE,) 8 MG/3ML SOPN Inject 2 mg into the skin once a week.   aspirin  81 MG tablet Take 81 mg by mouth daily.   azithromycin (ZITHROMAX) 250 MG tablet Take 250 mg by mouth as directed.   benazepril (LOTENSIN) 40 MG tablet Take 40 mg by mouth daily.   Blood Glucose Monitoring Suppl (FREESTYLE LITE) DEVI 1 each by Does not apply route 2 (two) times daily. Use as instructed to check blood glucose two times a day.   buPROPion (WELLBUTRIN SR) 150 MG 12 hr tablet Take 150 mg by mouth 2 (two) times daily.   chlorhexidine (PERIDEX) 0.12 % solution Use as directed 5 mLs in the mouth or throat 2 (two) times a week.    Coenzyme Q10 (CO Q 10) 100 MG CAPS Take 100 mg by mouth daily.   cyclobenzaprine  (FLEXERIL ) 10 MG tablet Take 1 tablet (10 mg total) by mouth 2 (two) times daily as needed for muscle spasms.   empagliflozin  (JARDIANCE ) 25 MG TABS tablet Take 1 tablet (25  mg total) by mouth daily before breakfast.   febuxostat (ULORIC) 40 MG tablet Take 40 mg by mouth daily.   furosemide  (LASIX ) 40 MG tablet Take 40 mg by mouth daily as needed.   glucose blood (ACCU-CHEK GUIDE) test strip Use as instructed   glucose blood test strip 1 each by Other route 2 (two) times daily. Use as instructed bid. E11.65   Lancets (FREESTYLE) lancets Use as instructed to check blood glucose twice daily   levothyroxine  (SYNTHROID ) 112 MCG tablet Take 112 mcg by mouth daily before breakfast.   lidocaine  (LIDODERM ) 5 % Place 1 patch onto the skin daily. Remove & Discard patch within 12 hours or as directed by MD   NIFEdipine (PROCARDIA-XL/NIFEDICAL-XL) 30 MG 24 hr tablet Take 30 mg by mouth daily.   omeprazole (PRILOSEC) 20 MG capsule Take 20 mg by mouth daily as needed.   ondansetron  (ZOFRAN ) 4 MG tablet Take 4 mg by mouth every 8 (eight) hours  as needed.   potassium chloride (K-DUR) 10 MEQ tablet Take 10 mEq by mouth daily. (Patient not taking: Reported on 02/12/2023)   Probiotic Product (PROBIOTIC DAILY PO) Take 1 tablet by mouth daily. Woman's   rosuvastatin (CRESTOR) 5 MG tablet Take 5 mg by mouth daily.   traMADol (ULTRAM) 50 MG tablet Take 50 mg by mouth 3 (three) times daily as needed.   [DISCONTINUED] Semaglutide , 1 MG/DOSE, 4 MG/3ML SOPN Inject 1 mg as directed once a week.   No facility-administered encounter medications on file as of 05/16/2023.   ALLERGIES: Allergies  Allergen Reactions   Metronidazole Other (See Comments)    Shortness of breath   VACCINATION STATUS: Immunization History  Administered Date(s) Administered   Moderna Sars-Covid-2 Vaccination 03/14/2019, 04/15/2019    Diabetes She presents for her follow-up diabetic visit. She has type 2 diabetes mellitus. Onset time: She was diagnosed at approximate age of 28 years. Her disease course has been improving. There are no hypoglycemic associated symptoms. There are no diabetic associated symptoms. There are no hypoglycemic complications. Symptoms are improving. Diabetic complications include nephropathy. Risk factors for coronary artery disease include diabetes mellitus, dyslipidemia, hypertension, obesity, sedentary lifestyle, post-menopausal and tobacco exposure. Her weight is decreasing steadily. She is following a generally unhealthy diet. When asked about meal planning, she reported none. She has not had a previous visit with a dietitian. She rarely participates in exercise. Her home blood glucose trend is decreasing steadily. (She presents without any meter nor logs.  Her point-of-care A1c is improving to 7.5% from 8.9%.  She is tolerating her medications, denies hypoglycemia.      ) An ACE inhibitor/angiotensin II receptor blocker is being taken. She does not see a podiatrist.Eye exam is not current.   Review of Systems    Review of  systems  Constitutional: + Minimally fluctuating body weight,  current  Body mass index is 30.71 kg/m. , no fatigue, no subjective hyperthermia, no subjective hypothermia    Objective:    BP 128/76   Pulse 68   Ht 5' 7.5" (1.715 m)   Wt 199 lb (90.3 kg)   BMI 30.71 kg/m   Wt Readings from Last 3 Encounters:  05/16/23 199 lb (90.3 kg)  02/12/23 206 lb 9.6 oz (93.7 kg)  11/30/22 212 lb (96.2 kg)    Physical Exam  Physical Exam- Limited  Constitutional:  Body mass index is 30.71 kg/m. , not in acute distress, normal state of mind    Recent Results (from the past 2160  hours)  Comprehensive metabolic panel     Status: Abnormal   Collection Time: 05/10/23  1:35 PM  Result Value Ref Range   Glucose 108 (H) 70 - 99 mg/dL   BUN 13 8 - 27 mg/dL   Creatinine, Ser 1.61 (H) 0.57 - 1.00 mg/dL   eGFR 49 (L) >09 UE/AVW/0.98   BUN/Creatinine Ratio 11 (L) 12 - 28   Sodium 142 134 - 144 mmol/L   Potassium 4.8 3.5 - 5.2 mmol/L   Chloride 104 96 - 106 mmol/L   CO2 23 20 - 29 mmol/L   Calcium 10.0 8.7 - 10.3 mg/dL   Total Protein 7.2 6.0 - 8.5 g/dL   Albumin 4.7 3.8 - 4.8 g/dL   Globulin, Total 2.5 1.5 - 4.5 g/dL   Bilirubin Total 0.5 0.0 - 1.2 mg/dL   Alkaline Phosphatase 108 44 - 121 IU/L   AST 21 0 - 40 IU/L   ALT 29 0 - 32 IU/L  Lipid panel     Status: Abnormal   Collection Time: 05/10/23  1:35 PM  Result Value Ref Range   Cholesterol, Total 121 100 - 199 mg/dL   Triglycerides 119 (H) 0 - 149 mg/dL   HDL 43 >14 mg/dL   VLDL Cholesterol Cal 34 5 - 40 mg/dL   LDL Chol Calc (NIH) 44 0 - 99 mg/dL   Chol/HDL Ratio 2.8 0.0 - 4.4 ratio    Comment:                                   T. Chol/HDL Ratio                                             Men  Women                               1/2 Avg.Risk  3.4    3.3                                   Avg.Risk  5.0    4.4                                2X Avg.Risk  9.6    7.1                                3X Avg.Risk 23.4   11.0   TSH      Status: None   Collection Time: 05/10/23  1:35 PM  Result Value Ref Range   TSH 2.570 0.450 - 4.500 uIU/mL  T4, free     Status: None   Collection Time: 05/10/23  1:35 PM  Result Value Ref Range   Free T4 1.35 0.82 - 1.77 ng/dL  POCT UA - Microalbumin     Status: None   Collection Time: 05/16/23  1:57 PM  Result Value Ref Range   Microalbumin Ur, POC 30 mg/L   Creatinine, POC 200 mg/dL   Albumin/Creatinine Ratio, Urine, POC <30   HgB A1c  Status: Abnormal   Collection Time: 05/16/23  1:58 PM  Result Value Ref Range   Hemoglobin A1C     HbA1c POC (<> result, manual entry)     HbA1c, POC (prediabetic range)     HbA1c, POC (controlled diabetic range) 7.5 (A) 0.0 - 7.0 %   Lipid Panel     Component Value Date/Time   CHOL 121 05/10/2023 1335   TRIG 212 (H) 05/10/2023 1335   HDL 43 05/10/2023 1335   CHOLHDL 2.8 05/10/2023 1335   CHOLHDL 4.2 09/12/2021 1432   LDLCALC 44 05/10/2023 1335   LDLCALC 90 09/12/2021 1432   LABVLDL 34 05/10/2023 1335   -She is documented to have multinodular goiter. Thyroid  ultrasound in 8/22   Right lobe: 3.8 x 1.8 x 1.5 cm,  Left lobe: 4.4 x 2.5 x 1.3 cm  IMPRESSION: Solitary left thyroid  nodule demonstrates minimal growth since 2013 which indicates a benign etiology. Given the location of this nodule, it could also represent a parathyroid adenoma.   Assessment & Plan:   1. Hypothyroidism -Her thyroid  function tests are consistent with appropriate replacement.  She is advised to continue levothyroxine  112 mcg p.o. daily before breakfast.     - We discussed about the correct intake of her thyroid  hormone, on empty stomach at fasting, with water, separated by at least 30 minutes from breakfast and other medications,  and separated by more than 4 hours from calcium, iron, multivitamins, acid reflux medications (PPIs). -Patient is made aware of the fact that thyroid  hormone replacement is needed for life, dose to be adjusted by periodic monitoring  of thyroid  function tests.   2.  Type 2 diabetes-    She presents without any meter nor logs.  Her point-of-care A1c is improving to 7.5% from 8.9%.  She is tolerating her medications, denies hypoglycemia.      She is benefiting from GLP-1 receptor agonist.  She is advised to start monitoring blood glucose at least twice a day-daily before breakfast and at bedtime, and encouraged to call clinic for hypoglycemia under 70 or hyperglycemia above 150 mg per DL at fasting.    In the meantime, she is advised to continue Jardiance  25 mg p.o. daily at breakfast.  She will benefit from a higher dose of Ozempic , discussed and increase her Ozempic  to 2 mg subcutaneously weekly.  Side effects and precautions discussed with her.     - she acknowledges that there is a room for improvement in her food and drink choices. - Suggestion is made for her to avoid simple carbohydrates  from her diet including Cakes, Sweet Desserts, Ice Cream, Soda (diet and regular), Sweet Tea, Candies, Chips, Cookies, Store Bought Juices, Alcohol  in Excess of  1-2 drinks a day, Artificial Sweeteners,  Coffee Creamer, and "Sugar-free" Products, Lemonade. This will help patient to have more stable blood glucose profile and potentially avoid unintended weight gain.  3) hypertension:  - Her blood pressure is controlled to target.  her urine microalbumin is normal today.  She is currently on benazepril 40 mg p.o. daily.  She has benefited from addition of hydrochlorothiazide  25 mg p.o. daily at breakfast.  She remains on benazepril 40 mg p.o. daily at breakfast as well.     4) hyperlipidemia:  Her triglycerides are improving after she was started on fenofibrate .  Her last LDL was improved at 62.   She is advised to continue Crestor 5 mg p.o. nightly along with fenofibrate  48 mg p.o. daily at bedtime.  Side effects and precautions discussed with her.  5) Multinodular goiter : She will not need any antithyroid intervention at this  time.  See above for hypothyroidism.  She recently had normal ABIs.  This study will be repeated in May 2027, or sooner if needed.   - I advised patient to maintain close follow up with Theoplis Fix, MD for primary care needs.  I spent  26  minutes in the care of the patient today including review of labs from Thyroid  Function, CMP, and other relevant labs ; imaging/biopsy records (current and previous including abstractions from other facilities); face-to-face time discussing  her lab results and symptoms, medications doses, her options of short and long term treatment based on the latest standards of care / guidelines;   and documenting the encounter.  Dana Lawson  participated in the discussions, expressed understanding, and voiced agreement with the above plans.  All questions were answered to her satisfaction. she is encouraged to contact clinic should she have any questions or concerns prior to her return visit.     Follow up plan: Return in about 6 months (around 11/16/2023) for F/U with Pre-visit Labs, Meter/CGM/Logs, A1c here.  Kalvin Orf, MD Phone: [phone]  Fax: [phone]  This note was partially dictated with voice recognition software. Similar sounding words can be transcribed inadequately or may not  be corrected upon review.  05/16/2023, 2:05 PM

## 2023-05-17 DIAGNOSIS — I10 Essential (primary) hypertension: Secondary | ICD-10-CM | POA: Diagnosis not present

## 2023-05-17 DIAGNOSIS — E1169 Type 2 diabetes mellitus with other specified complication: Secondary | ICD-10-CM | POA: Diagnosis not present

## 2023-05-17 DIAGNOSIS — Z299 Encounter for prophylactic measures, unspecified: Secondary | ICD-10-CM | POA: Diagnosis not present

## 2023-05-17 DIAGNOSIS — M5430 Sciatica, unspecified side: Secondary | ICD-10-CM | POA: Diagnosis not present

## 2023-06-19 DIAGNOSIS — Z299 Encounter for prophylactic measures, unspecified: Secondary | ICD-10-CM | POA: Diagnosis not present

## 2023-06-19 DIAGNOSIS — N1832 Chronic kidney disease, stage 3b: Secondary | ICD-10-CM | POA: Diagnosis not present

## 2023-06-19 DIAGNOSIS — I10 Essential (primary) hypertension: Secondary | ICD-10-CM | POA: Diagnosis not present

## 2023-06-19 DIAGNOSIS — E1165 Type 2 diabetes mellitus with hyperglycemia: Secondary | ICD-10-CM | POA: Diagnosis not present

## 2023-06-19 DIAGNOSIS — I2510 Atherosclerotic heart disease of native coronary artery without angina pectoris: Secondary | ICD-10-CM | POA: Diagnosis not present

## 2023-07-09 DIAGNOSIS — E1122 Type 2 diabetes mellitus with diabetic chronic kidney disease: Secondary | ICD-10-CM | POA: Diagnosis not present

## 2023-07-09 DIAGNOSIS — N1831 Chronic kidney disease, stage 3a: Secondary | ICD-10-CM | POA: Diagnosis not present

## 2023-07-09 DIAGNOSIS — I10 Essential (primary) hypertension: Secondary | ICD-10-CM | POA: Diagnosis not present

## 2023-07-16 DIAGNOSIS — E1122 Type 2 diabetes mellitus with diabetic chronic kidney disease: Secondary | ICD-10-CM | POA: Diagnosis not present

## 2023-07-16 DIAGNOSIS — N1831 Chronic kidney disease, stage 3a: Secondary | ICD-10-CM | POA: Diagnosis not present

## 2023-07-16 DIAGNOSIS — I10 Essential (primary) hypertension: Secondary | ICD-10-CM | POA: Diagnosis not present

## 2023-07-16 DIAGNOSIS — N2581 Secondary hyperparathyroidism of renal origin: Secondary | ICD-10-CM | POA: Diagnosis not present

## 2023-07-19 DIAGNOSIS — M5416 Radiculopathy, lumbar region: Secondary | ICD-10-CM | POA: Diagnosis not present

## 2023-07-24 ENCOUNTER — Other Ambulatory Visit (HOSPITAL_COMMUNITY): Payer: Self-pay | Admitting: Surgery

## 2023-07-24 DIAGNOSIS — M5416 Radiculopathy, lumbar region: Secondary | ICD-10-CM

## 2023-07-28 ENCOUNTER — Ambulatory Visit (HOSPITAL_COMMUNITY)
Admission: RE | Admit: 2023-07-28 | Discharge: 2023-07-28 | Disposition: A | Discharge status: Home / Self Care | Source: Ambulatory Visit | Attending: Surgery | Admitting: Surgery

## 2023-07-28 DIAGNOSIS — M5136 Other intervertebral disc degeneration, lumbar region with discogenic back pain only: Secondary | ICD-10-CM | POA: Diagnosis not present

## 2023-07-28 DIAGNOSIS — R609 Edema, unspecified: Secondary | ICD-10-CM | POA: Diagnosis not present

## 2023-07-28 DIAGNOSIS — M5416 Radiculopathy, lumbar region: Secondary | ICD-10-CM | POA: Insufficient documentation

## 2023-07-28 DIAGNOSIS — M4316 Spondylolisthesis, lumbar region: Secondary | ICD-10-CM | POA: Diagnosis not present

## 2023-07-28 DIAGNOSIS — M47816 Spondylosis without myelopathy or radiculopathy, lumbar region: Secondary | ICD-10-CM | POA: Diagnosis not present

## 2023-08-12 DIAGNOSIS — Z6831 Body mass index (BMI) 31.0-31.9, adult: Secondary | ICD-10-CM | POA: Diagnosis not present

## 2023-08-12 DIAGNOSIS — M47816 Spondylosis without myelopathy or radiculopathy, lumbar region: Secondary | ICD-10-CM | POA: Diagnosis not present

## 2023-08-19 ENCOUNTER — Ambulatory Visit: Attending: Internal Medicine | Admitting: Internal Medicine

## 2023-08-19 ENCOUNTER — Encounter: Payer: Self-pay | Admitting: Internal Medicine

## 2023-08-19 VITALS — BP 158/80 | HR 73 | Ht 67.0 in | Wt 199.0 lb

## 2023-08-19 DIAGNOSIS — M7989 Other specified soft tissue disorders: Secondary | ICD-10-CM | POA: Insufficient documentation

## 2023-08-19 DIAGNOSIS — R42 Dizziness and giddiness: Secondary | ICD-10-CM | POA: Diagnosis not present

## 2023-08-19 DIAGNOSIS — I872 Venous insufficiency (chronic) (peripheral): Secondary | ICD-10-CM

## 2023-08-19 DIAGNOSIS — I10 Essential (primary) hypertension: Secondary | ICD-10-CM | POA: Diagnosis not present

## 2023-08-19 DIAGNOSIS — I6529 Occlusion and stenosis of unspecified carotid artery: Secondary | ICD-10-CM | POA: Diagnosis not present

## 2023-08-19 NOTE — Progress Notes (Signed)
 Cardiology Office Note  Date: 08/19/2023   ID: JANAN BOGIE, DOB 1945-06-29, MRN [id number]  PCP:  Leavy Waddell NOVAK, FNP  Cardiologist:  None Electrophysiologist:  None   History of Present Illness: Dana Lawson is a 78 y.o. female  Referred to cardiology clinic to establish care.  She was hospitalized in December 2024 in [place]  for dizziness.  Lasix  was taken off the list.  Her dizziness improved.  But her main complaint is positional dizziness from lying down to sitting position and sometimes from sitting to standing position.  Orthostatic vitals today in the clinic were negative for orthostatic hypotension and POTS but while checking the orthostatics, she felt dizzy for couple of seconds and she was back to normal.  She does not have any syncope, severe fatigue.  No chest pain/angina or DOE.  No orthopnea, PND either.  She does have right lower EXTR swelling that has been chronic.  She also reported having stiff right leg which she wants to get checked out.  Past Medical History:  Diagnosis Date   Anxiety    COPD (chronic obstructive pulmonary disease) (HCC)    Depression    Essential hypertension    GERD (gastroesophageal reflux disease)    Hepatic steatosis    Hyperlipidemia    Hypothyroidism    Type 2 diabetes mellitus (HCC)     Past Surgical History:  Procedure Laterality Date   BREAST BIOPSY Left    per pt benign   LEFT HEART CATH AND CORONARY ANGIOGRAPHY N/A 11/08/2016   Procedure: LEFT HEART CATH AND CORONARY ANGIOGRAPHY;  Surgeon: Anner Alm ORN, MD;  Location: [facility] INVASIVE CV LAB;  Service: Cardiovascular;  Laterality: N/A;   TUBAL LIGATION      Current Outpatient Medications  Medication Sig Dispense Refill   aspirin  81 MG tablet Take 81 mg by mouth daily.     Blood Glucose Monitoring Suppl (FREESTYLE LITE) DEVI 1 each by Does not apply route 2 (two) times daily. Use as instructed to check blood glucose two times a day. 1 each 0   buPROPion  (WELLBUTRIN SR) 150 MG 12 hr tablet Take 150 mg by mouth 2 (two) times daily.     chlorhexidine (PERIDEX) 0.12 % solution Use as directed 5 mLs in the mouth or throat 2 (two) times a week.      Coenzyme Q10 (CO Q 10) 100 MG CAPS Take 100 mg by mouth daily.     cyclobenzaprine  (FLEXERIL ) 10 MG tablet Take 1 tablet (10 mg total) by mouth 2 (two) times daily as needed for muscle spasms. 20 tablet 0   empagliflozin  (JARDIANCE ) 25 MG TABS tablet Take 1 tablet (25 mg total) by mouth daily before breakfast. 90 tablet 1   febuxostat (ULORIC) 40 MG tablet Take 40 mg by mouth daily.     glucose blood (ACCU-CHEK GUIDE) test strip Use as instructed 300 each 1   glucose blood test strip 1 each by Other route 2 (two) times daily. Use as instructed bid. E11.65 200 each 2   Lancets (FREESTYLE) lancets Use as instructed to check blood glucose twice daily 100 each 12   levothyroxine  (SYNTHROID ) 112 MCG tablet Take 112 mcg by mouth daily before breakfast.     NIFEdipine (PROCARDIA-XL/NIFEDICAL-XL) 30 MG 24 hr tablet Take 30 mg by mouth daily.     ondansetron  (ZOFRAN ) 4 MG tablet Take 4 mg by mouth every 8 (eight) hours as needed.     Probiotic Product (PROBIOTIC DAILY PO)  Take 1 tablet by mouth daily. Woman's     rosuvastatin (CRESTOR) 5 MG tablet Take 5 mg by mouth daily.     Semaglutide , 2 MG/DOSE, (OZEMPIC , 2 MG/DOSE,) 8 MG/3ML SOPN Inject 2 mg into the skin once a week. 3 mL 3   traMADol (ULTRAM) 50 MG tablet Take 50 mg by mouth 3 (three) times daily as needed.     lidocaine  (LIDODERM ) 5 % Place 1 patch onto the skin daily. Remove & Discard patch within 12 hours or as directed by MD (Patient not taking: Reported on 08/19/2023) 30 patch 0   No current facility-administered medications for this visit.   Allergies:  Metronidazole   Social History: The patient  reports that she quit smoking about 7 years ago. Her smoking use included cigarettes. She has never used smokeless tobacco. She reports that she does not  currently use alcohol . She reports that she does not use drugs.   Family History: The patient's family history includes COPD in her father; CVA in her brother; Celiac disease in her sister; Heart attack in her mother; Stroke in her mother.   ROS:  Please see the history of present illness. Otherwise, complete review of systems is positive for none  All other systems are reviewed and negative.   Physical Exam: VS:  BP (!) 158/80 (BP Location: Right Arm, Cuff Size: Large)   Pulse 73   Ht 5' 7 (1.702 m)   Wt 199 lb (90.3 kg)   SpO2 95%   BMI 31.17 kg/m , BMI Body mass index is 31.17 kg/m.  Wt Readings from Last 3 Encounters:  08/19/23 199 lb (90.3 kg)  05/16/23 199 lb (90.3 kg)  02/12/23 206 lb 9.6 oz (93.7 kg)    General: Patient appears comfortable at rest. HEENT: Conjunctiva and lids normal, oropharynx clear with moist mucosa. Neck: Supple, no elevated JVP or carotid bruits, no thyromegaly. Lungs: Clear to auscultation, nonlabored breathing at rest. Cardiac: Regular rate and rhythm, no S3 or significant systolic murmur, no pericardial rub. Abdomen: Soft, nontender, no hepatomegaly, bowel sounds present, no guarding or rebound. Extremities: No pitting edema, distal pulses 2+. Skin: Warm and dry. Musculoskeletal: No kyphosis. Neuropsychiatric: Alert and oriented x3, affect grossly appropriate.  Recent Labwork: 11/30/2022: Hemoglobin 13.7; Platelets 200 05/10/2023: ALT 29; AST 21; BUN 13; Creatinine, Ser 1.17; Potassium 4.8; Sodium 142; TSH 2.570     Component Value Date/Time   CHOL 121 05/10/2023 1335   TRIG 212 (H) 05/10/2023 1335   HDL 43 05/10/2023 1335   CHOLHDL 2.8 05/10/2023 1335   CHOLHDL 4.2 09/12/2021 1432   LDLCALC 44 05/10/2023 1335   LDLCALC 90 09/12/2021 1432    Other Studies Reviewed Today: Prior records reviewed.  Assessment and Plan:  Dizziness - Dizziness mainly improved after she was taken off of Lasix  during December 2020 for hospitalization in  [place] . - Mainly positional dizziness and not exertional. - Echocardiogram from 12/2022 showed normal LVEF, G1 DD with normal LV filling pressures and no valvular heart disease. - Orthostatic vitals today in the clinic were negative for orthostatic hypotension and POTS. - Provided reassurance, to stand up slowly from sitting to standing position as well as from lying down to sitting position.  Patient verbalized understanding.  Right lower EXTR swelling - Likely secondary to chronic venous insufficiency but she also has pain in the right lower extremity, rule out DVT. - Obtain venous reflux study of the right lower leg including DVT study. - She wears compression socks.  Carotid artery stenosis - Imaging from December 2024 in [place]  showed calcific and noncalcific atherosclerotic plaque causing 30 to 50% stenosis of the left proximal cervical ICA.  Will obtain carotid Doppler ultrasound in 6 months before the next clinic visit.  HTN, controlled - Continue current antihypertensives, nifedipine 30 mg once daily. - Check blood pressures at home, in a.m. and p.m.  HLD, at goal - Continue rosuvastatin 5 mg at bedtime.  Goal LDL less than 100.  LDL 44 in May 25.   44 minutes spent in reviewing the records, more than 3 labs, tests, imaging, discussion of her symptoms, discussion of CAD, MI, heart healthy diet, exercise recs and documentation.     Medication Adjustments/Labs and Tests Ordered: Current medicines are reviewed at length with the patient today.  Concerns regarding medicines are outlined above.    Disposition:  Follow up 6 months  Signed Atwell Mcdanel Priya Yeng Frankie, MD, 08/19/2023 2:15 PM    [facility] [address] [place], [place], [place] [address]

## 2023-08-19 NOTE — Patient Instructions (Addendum)
 Medication Instructions:  Your physician recommends that you continue on your current medications as directed. Please refer to the Current Medication list given to you today.   Labwork: None  Testing/Procedures: Your physician has requested that you have a carotid duplex in 6 months. This test is an ultrasound of the carotid arteries in your neck. It looks at blood flow through these arteries that supply the brain with blood. Allow one hour for this exam. There are no restrictions or special instructions.  Your physician has requested that you have a lower or upper extremity venous duplex. This test is an ultrasound of the veins in the legs or arms. It looks at venous blood flow that carries blood from the heart to the legs or arms. Allow one hour for a Lower Venous exam. Allow thirty minutes for an Upper Venous exam. There are no restrictions or special instructions.  Please note: We ask at that you not bring children with you during ultrasound (echo/ vascular) testing. Due to room size and safety concerns, children are not allowed in the ultrasound rooms during exams. Our front office staff cannot provide observation of children in our lobby area while testing is being conducted. An adult accompanying a patient to their appointment will only be allowed in the ultrasound room at the discretion of the ultrasound technician under special circumstances. We apologize for any inconvenience.   Follow-Up: Your physician recommends that you schedule a follow-up appointment in: 6 months  Any Other Special Instructions Will Be Listed Below (If Applicable). Thank you for choosing [facility]!     If you need a refill on your cardiac medications before your next appointment, please call your pharmacy.

## 2023-08-26 DIAGNOSIS — F1320 Sedative, hypnotic or anxiolytic dependence, uncomplicated: Secondary | ICD-10-CM | POA: Diagnosis not present

## 2023-08-26 DIAGNOSIS — Z299 Encounter for prophylactic measures, unspecified: Secondary | ICD-10-CM | POA: Diagnosis not present

## 2023-08-26 DIAGNOSIS — I10 Essential (primary) hypertension: Secondary | ICD-10-CM | POA: Diagnosis not present

## 2023-08-26 DIAGNOSIS — E1165 Type 2 diabetes mellitus with hyperglycemia: Secondary | ICD-10-CM | POA: Diagnosis not present

## 2023-08-26 DIAGNOSIS — Z1339 Encounter for screening examination for other mental health and behavioral disorders: Secondary | ICD-10-CM | POA: Diagnosis not present

## 2023-08-26 DIAGNOSIS — Z7189 Other specified counseling: Secondary | ICD-10-CM | POA: Diagnosis not present

## 2023-08-26 DIAGNOSIS — Z0000 Encounter for general adult medical examination without abnormal findings: Secondary | ICD-10-CM | POA: Diagnosis not present

## 2023-08-26 DIAGNOSIS — Z1331 Encounter for screening for depression: Secondary | ICD-10-CM | POA: Diagnosis not present

## 2023-08-26 DIAGNOSIS — N1832 Chronic kidney disease, stage 3b: Secondary | ICD-10-CM | POA: Diagnosis not present

## 2023-09-03 DIAGNOSIS — E2839 Other primary ovarian failure: Secondary | ICD-10-CM | POA: Diagnosis not present

## 2023-09-12 ENCOUNTER — Encounter

## 2023-09-12 ENCOUNTER — Ambulatory Visit: Attending: Internal Medicine

## 2023-09-12 DIAGNOSIS — I872 Venous insufficiency (chronic) (peripheral): Secondary | ICD-10-CM | POA: Diagnosis not present

## 2023-09-15 DIAGNOSIS — Z1212 Encounter for screening for malignant neoplasm of rectum: Secondary | ICD-10-CM | POA: Diagnosis not present

## 2023-09-15 DIAGNOSIS — Z1211 Encounter for screening for malignant neoplasm of colon: Secondary | ICD-10-CM | POA: Diagnosis not present

## 2023-09-17 ENCOUNTER — Ambulatory Visit: Payer: Self-pay | Admitting: Internal Medicine

## 2023-09-17 NOTE — Telephone Encounter (Signed)
 The patient has been notified of the result and verbalized understanding.  All questions (if any) were answered. Littie CHRISTELLA Croak, CMA 09/17/2023 3:46 PM

## 2023-09-17 NOTE — Telephone Encounter (Signed)
-----   Message from Vishnu P Mallipeddi sent at 09/17/2023  1:28 PM EDT ----- No DVT. Venous reflux noted, wear compression socks. ----- Message ----- From: Interface, Three One Seven Sent: 09/12/2023  12:46 PM EDT To: Vishnu P Mallipeddi, MD

## 2023-09-18 DIAGNOSIS — H01001 Unspecified blepharitis right upper eyelid: Secondary | ICD-10-CM | POA: Diagnosis not present

## 2023-09-18 DIAGNOSIS — E119 Type 2 diabetes mellitus without complications: Secondary | ICD-10-CM | POA: Diagnosis not present

## 2023-09-18 DIAGNOSIS — H04123 Dry eye syndrome of bilateral lacrimal glands: Secondary | ICD-10-CM | POA: Diagnosis not present

## 2023-09-18 DIAGNOSIS — H1045 Other chronic allergic conjunctivitis: Secondary | ICD-10-CM | POA: Diagnosis not present

## 2023-09-23 DIAGNOSIS — Z23 Encounter for immunization: Secondary | ICD-10-CM | POA: Diagnosis not present

## 2023-09-23 DIAGNOSIS — Z6831 Body mass index (BMI) 31.0-31.9, adult: Secondary | ICD-10-CM | POA: Diagnosis not present

## 2023-09-23 DIAGNOSIS — J029 Acute pharyngitis, unspecified: Secondary | ICD-10-CM | POA: Diagnosis not present

## 2023-09-23 DIAGNOSIS — Z299 Encounter for prophylactic measures, unspecified: Secondary | ICD-10-CM | POA: Diagnosis not present

## 2023-09-23 DIAGNOSIS — R059 Cough, unspecified: Secondary | ICD-10-CM | POA: Diagnosis not present

## 2023-09-23 DIAGNOSIS — I10 Essential (primary) hypertension: Secondary | ICD-10-CM | POA: Diagnosis not present

## 2023-09-23 LAB — COLOGUARD: COLOGUARD: NEGATIVE

## 2023-11-13 DIAGNOSIS — E038 Other specified hypothyroidism: Secondary | ICD-10-CM | POA: Diagnosis not present

## 2023-11-14 LAB — COMPREHENSIVE METABOLIC PANEL WITH GFR
ALT: 36 IU/L — ABNORMAL HIGH (ref 0–32)
AST: 23 IU/L (ref 0–40)
Albumin: 4.3 g/dL (ref 3.8–4.8)
Alkaline Phosphatase: 103 IU/L (ref 49–135)
BUN/Creatinine Ratio: 15 (ref 12–28)
BUN: 17 mg/dL (ref 8–27)
Bilirubin Total: 0.6 mg/dL (ref 0.0–1.2)
CO2: 25 mmol/L (ref 20–29)
Calcium: 10.1 mg/dL (ref 8.7–10.3)
Chloride: 102 mmol/L (ref 96–106)
Creatinine, Ser: 1.16 mg/dL — ABNORMAL HIGH (ref 0.57–1.00)
Globulin, Total: 2.9 g/dL (ref 1.5–4.5)
Glucose: 174 mg/dL — ABNORMAL HIGH (ref 70–99)
Potassium: 4.4 mmol/L (ref 3.5–5.2)
Sodium: 143 mmol/L (ref 134–144)
Total Protein: 7.2 g/dL (ref 6.0–8.5)
eGFR: 49 mL/min/1.73 — ABNORMAL LOW (ref >59)

## 2023-11-14 LAB — T4, FREE: Free T4: 1.4 ng/dL (ref 0.82–1.77)

## 2023-11-14 LAB — TSH: TSH: 4.65 u[IU]/mL — ABNORMAL HIGH (ref 0.450–4.500)

## 2023-11-19 ENCOUNTER — Ambulatory Visit: Admitting: "Endocrinology

## 2023-11-19 ENCOUNTER — Encounter: Payer: Self-pay | Admitting: "Endocrinology

## 2023-11-19 VITALS — BP 136/64 | HR 68 | Ht 67.0 in | Wt 199.4 lb

## 2023-11-19 DIAGNOSIS — E038 Other specified hypothyroidism: Secondary | ICD-10-CM

## 2023-11-19 DIAGNOSIS — E782 Mixed hyperlipidemia: Secondary | ICD-10-CM

## 2023-11-19 DIAGNOSIS — Z7984 Long term (current) use of oral hypoglycemic drugs: Secondary | ICD-10-CM | POA: Diagnosis not present

## 2023-11-19 DIAGNOSIS — I10 Essential (primary) hypertension: Secondary | ICD-10-CM

## 2023-11-19 DIAGNOSIS — E118 Type 2 diabetes mellitus with unspecified complications: Secondary | ICD-10-CM

## 2023-11-19 DIAGNOSIS — E119 Type 2 diabetes mellitus without complications: Secondary | ICD-10-CM | POA: Diagnosis not present

## 2023-11-19 DIAGNOSIS — Z7985 Long-term (current) use of injectable non-insulin antidiabetic drugs: Secondary | ICD-10-CM

## 2023-11-19 LAB — POCT GLYCOSYLATED HEMOGLOBIN (HGB A1C)

## 2023-11-19 MED ORDER — LEVOTHYROXINE SODIUM 125 MCG PO TABS
125.0000 ug | ORAL_TABLET | Freq: Every day | ORAL | 1 refills | Status: AC
Start: 2023-11-19 — End: ?

## 2023-11-19 NOTE — Patient Instructions (Signed)

## 2023-11-19 NOTE — Progress Notes (Signed)
 11/19/2023   Endocrinology follow-up note   Subjective:    Patient ID: Dana Lawson, female    DOB: 1946/01/05, PCP Leavy Waddell NOVAK, FNP   Past Medical History:  Diagnosis Date   Anxiety    COPD (chronic obstructive pulmonary disease) (HCC)    Depression    Essential hypertension    GERD (gastroesophageal reflux disease)    Hepatic steatosis    Hyperlipidemia    Hypothyroidism    Type 2 diabetes mellitus (HCC)    Past Surgical History:  Procedure Laterality Date   BREAST BIOPSY Left    per pt benign   LEFT HEART CATH AND CORONARY ANGIOGRAPHY N/A 11/08/2016   Procedure: LEFT HEART CATH AND CORONARY ANGIOGRAPHY;  Surgeon: Anner Alm ORN, MD;  Location: [facility] INVASIVE CV LAB;  Service: Cardiovascular;  Laterality: N/A;   TUBAL LIGATION     Social History   Socioeconomic History   Marital status: Married    Spouse name: Not on file   Number of children: Not on file   Years of education: Not on file   Highest education level: Not on file  Occupational History   Not on file  Tobacco Use   Smoking status: Former    Current packs/day: 0.00    Types: Cigarettes    Quit date: 11/06/2015    Years since quitting: 8.0   Smokeless tobacco: Never  Vaping Use   Vaping status: Never Used  Substance and Sexual Activity   Alcohol  use: Not Currently    Comment: Occasional   Drug use: No   Sexual activity: Not on file  Other Topics Concern   Not on file  Social History Narrative   Not on file   Social Drivers of Health   Financial Resource Strain: Not on file  Food Insecurity: No Food Insecurity (12/31/2022)   Received from [facility]   Hunger Vital Sign    Within the past 12 months, you worried that your food would run out before you got the money to buy more.: Never true    Within the past 12 months, the food you bought just didn't last and you didn't have money to get more.: Never true  Transportation Needs: No Transportation Needs (12/31/2022)   Received from  [facility]   PRAPARE - Transportation    Lack of Transportation (Medical): No    Lack of Transportation (Non-Medical): No  Physical Activity: Not on file  Stress: Not on file  Social Connections: Not on file   Outpatient Encounter Medications as of 11/19/2023  Medication Sig   aspirin  81 MG tablet Take 81 mg by mouth daily.   Blood Glucose Monitoring Suppl (FREESTYLE LITE) DEVI 1 each by Does not apply route 2 (two) times daily. Use as instructed to check blood glucose two times a day.   buPROPion (WELLBUTRIN SR) 150 MG 12 hr tablet Take 150 mg by mouth 2 (two) times daily.   chlorhexidine (PERIDEX) 0.12 % solution Use as directed 5 mLs in the mouth or throat 2 (two) times a week.    Coenzyme Q10 (CO Q 10) 100 MG CAPS Take 100 mg by mouth daily. (Patient not taking: Reported on 11/19/2023)   cyclobenzaprine  (FLEXERIL ) 10 MG tablet Take 1 tablet (10 mg total) by mouth 2 (two) times daily as needed for muscle spasms.   empagliflozin  (JARDIANCE ) 25 MG TABS tablet Take 1 tablet (25 mg total) by mouth daily before breakfast.   febuxostat (ULORIC) 40 MG tablet Take 40 mg  by mouth daily.   glucose blood (ACCU-CHEK GUIDE) test strip Use as instructed   glucose blood test strip 1 each by Other route 2 (two) times daily. Use as instructed bid. E11.65   Lancets (FREESTYLE) lancets Use as instructed to check blood glucose twice daily   levothyroxine  (SYNTHROID ) 125 MCG tablet Take 1 tablet (125 mcg total) by mouth daily before breakfast.   lidocaine  (LIDODERM ) 5 % Place 1 patch onto the skin daily. Remove & Discard patch within 12 hours or as directed by MD (Patient not taking: Reported on 08/19/2023)   NIFEdipine (PROCARDIA-XL/NIFEDICAL-XL) 30 MG 24 hr tablet Take 30 mg by mouth daily.   ondansetron  (ZOFRAN ) 4 MG tablet Take 4 mg by mouth every 8 (eight) hours as needed.   Probiotic Product (PROBIOTIC DAILY PO) Take 1 tablet by mouth daily. Woman's (Patient not taking: Reported on 11/19/2023)    rosuvastatin (CRESTOR) 5 MG tablet Take 5 mg by mouth daily.   Semaglutide , 2 MG/DOSE, (OZEMPIC , 2 MG/DOSE,) 8 MG/3ML SOPN Inject 2 mg into the skin once a week.   traMADol (ULTRAM) 50 MG tablet Take 50 mg by mouth 3 (three) times daily as needed.   [DISCONTINUED] levothyroxine  (SYNTHROID ) 112 MCG tablet Take 112 mcg by mouth daily before breakfast.   No facility-administered encounter medications on file as of 11/19/2023.   ALLERGIES: Allergies  Allergen Reactions   Metronidazole Other (See Comments)    Shortness of breath   VACCINATION STATUS: Immunization History  Administered Date(s) Administered   Moderna Sars-Covid-2 Vaccination 03/14/2019, 04/15/2019    Diabetes She presents for her follow-up diabetic visit. She has type 2 diabetes mellitus. Onset time: She was diagnosed at approximate age of 34 years. Her disease course has been improving. There are no hypoglycemic associated symptoms. There are no diabetic associated symptoms. There are no hypoglycemic complications. Symptoms are improving. Diabetic complications include nephropathy. Risk factors for coronary artery disease include diabetes mellitus, dyslipidemia, hypertension, obesity, sedentary lifestyle, post-menopausal and tobacco exposure. Her weight is fluctuating minimally. She is following a generally unhealthy diet. When asked about meal planning, she reported none. She has not had a previous visit with a dietitian. She rarely participates in exercise. Her home blood glucose trend is fluctuating minimally. (She presents without any meter nor logs.  Her point-of-care A1c is improving to 7%, overall improving from 8.9%.  She is tolerating her current medications which include Ozempic  and Jardiance .     ) An ACE inhibitor/angiotensin II receptor blocker is being taken. She does not see a podiatrist.Eye exam is not current.   Review of Systems    Review of systems  Constitutional: + Minimally fluctuating body weight,   current  Body mass index is 31.23 kg/m. , no fatigue, no subjective hyperthermia, no subjective hypothermia    Objective:    BP 136/64   Pulse 68   Ht 5' 7 (1.702 m)   Wt 199 lb 6.4 oz (90.4 kg)   BMI 31.23 kg/m   Wt Readings from Last 3 Encounters:  11/19/23 199 lb 6.4 oz (90.4 kg)  08/19/23 199 lb (90.3 kg)  05/16/23 199 lb (90.3 kg)    Physical Exam  Physical Exam- Limited  Constitutional:  Body mass index is 31.23 kg/m. , not in acute distress, normal state of mind    Recent Results (from the past 2160 hours)  Comprehensive metabolic panel with GFR     Status: Abnormal   Collection Time: 11/13/23  3:24 PM  Result Value Ref Range  Glucose 174 (H) 70 - 99 mg/dL   BUN 17 8 - 27 mg/dL   Creatinine, Ser 8.83 (H) 0.57 - 1.00 mg/dL   eGFR 49 (L) >40 fO/fpw/8.26   BUN/Creatinine Ratio 15 12 - 28   Sodium 143 134 - 144 mmol/L   Potassium 4.4 3.5 - 5.2 mmol/L   Chloride 102 96 - 106 mmol/L   CO2 25 20 - 29 mmol/L   Calcium 10.1 8.7 - 10.3 mg/dL   Total Protein 7.2 6.0 - 8.5 g/dL   Albumin 4.3 3.8 - 4.8 g/dL   Globulin, Total 2.9 1.5 - 4.5 g/dL   Bilirubin Total 0.6 0.0 - 1.2 mg/dL   Alkaline Phosphatase 103 49 - 135 IU/L   AST 23 0 - 40 IU/L   ALT 36 (H) 0 - 32 IU/L  TSH     Status: Abnormal   Collection Time: 11/13/23  3:24 PM  Result Value Ref Range   TSH 4.650 (H) 0.450 - 4.500 uIU/mL  T4, free     Status: None   Collection Time: 11/13/23  3:24 PM  Result Value Ref Range   Free T4 1.40 0.82 - 1.77 ng/dL   Lipid Panel     Component Value Date/Time   CHOL 121 05/10/2023 1335   TRIG 212 (H) 05/10/2023 1335   HDL 43 05/10/2023 1335   CHOLHDL 2.8 05/10/2023 1335   CHOLHDL 4.2 09/12/2021 1432   LDLCALC 44 05/10/2023 1335   LDLCALC 90 09/12/2021 1432   LABVLDL 34 05/10/2023 1335   -She is documented to have multinodular goiter. Thyroid  ultrasound in 8/22   Right lobe: 3.8 x 1.8 x 1.5 cm,  Left lobe: 4.4 x 2.5 x 1.3 cm  IMPRESSION: Solitary left thyroid   nodule demonstrates minimal growth since 2013 which indicates a benign etiology. Given the location of this nodule, it could also represent a parathyroid adenoma.   Assessment & Plan:   1. Hypothyroidism -Her thyroid  function tests are consistent with under replacement.  I discussed and increased her levothyroxine  to 125 mcg p.o. daily before breakfast.     - We discussed about the correct intake of her thyroid  hormone, on empty stomach at fasting, with water, separated by at least 30 minutes from breakfast and other medications,  and separated by more than 4 hours from calcium, iron, multivitamins, acid reflux medications (PPIs). -Patient is made aware of the fact that thyroid  hormone replacement is needed for life, dose to be adjusted by periodic monitoring of thyroid  function tests.  2.  Type 2 diabetes-    She presents without any meter nor logs.  Her point-of-care A1c is improving to 7%, overall improving from 8.9%.  She is tolerating her current medications which include Ozempic  and Jardiance .      She is benefiting from GLP-1 receptor agonist.  She is advised to continue Ozempic  2 mg subcutaneous weekly, Jardiance  25 mg p.o. daily at breakfast.    She is advised to start monitoring blood glucose at least twice a day-daily before breakfast and at bedtime, and encouraged to call clinic for hypoglycemia under 70 or hyperglycemia above 150 mg per DL at fasting.    - she acknowledges that there is a room for improvement in her food and drink choices. - Suggestion is made for her to avoid simple carbohydrates  from her diet including Cakes, Sweet Desserts, Ice Cream, Soda (diet and regular), Sweet Tea, Candies, Chips, Cookies, Store Bought Juices, Alcohol  in Excess of  1-2 drinks a day,  Artificial Sweeteners,  Coffee Creamer, and Sugar-free Products, Lemonade. This will help patient to have more stable blood glucose profile and potentially avoid unintended weight gain.  3) hypertension:   - Her blood pressure is controlled to target.  her urine microalbumin is normal today.  She is currently on benazepril 40 mg p.o. daily.  She has benefited from addition of hydrochlorothiazide  25 mg p.o. daily at breakfast.  She remains on benazepril 40 mg p.o. daily at breakfast as well.     4) hyperlipidemia:  Her triglycerides are improving after she was started on fenofibrate .  Her most recent lipid panel showed LDL at 90.  She is advised to continue Crestor  5 mg p.o. nightly along with fenofibrate  48 mg p.o. daily at bedtime.    Side effects and precautions discussed with her.  5) Multinodular goiter : She will not need any antithyroid intervention at this time.  See above for hypothyroidism.  She recently had normal ABIs.  This study will be repeated in May 2027, or sooner if needed.   - I advised patient to maintain close follow up with Leavy Waddell NOVAK, FNP for primary care needs.   I spent  25  minutes in the care of the patient today including review of labs from Thyroid  Function, CMP, and other relevant labs ; imaging/biopsy records (current and previous including abstractions from other facilities); face-to-face time discussing  her lab results and symptoms, medications doses, her options of short and long term treatment based on the latest standards of care / guidelines;   and documenting the encounter.  Dana Lawson  participated in the discussions, expressed understanding, and voiced agreement with the above plans.  All questions were answered to her satisfaction. she is encouraged to contact clinic should she have any questions or concerns prior to her return visit.    Follow up plan: Return in about 6 months (around 05/18/2024) for Fasting Labs  in AM B4 8, A1c -[place].  Ranny Earl, MD Phone: [phone]  Fax: [phone]  This note was partially dictated with voice recognition software. Similar sounding words can be transcribed inadequately or may not  be corrected  upon review.  11/19/2023, 3:06 PM

## 2023-11-26 DIAGNOSIS — N1831 Chronic kidney disease, stage 3a: Secondary | ICD-10-CM | POA: Diagnosis not present

## 2023-11-26 DIAGNOSIS — I10 Essential (primary) hypertension: Secondary | ICD-10-CM | POA: Diagnosis not present

## 2023-11-26 DIAGNOSIS — E1122 Type 2 diabetes mellitus with diabetic chronic kidney disease: Secondary | ICD-10-CM | POA: Diagnosis not present

## 2023-12-03 DIAGNOSIS — I10 Essential (primary) hypertension: Secondary | ICD-10-CM | POA: Diagnosis not present

## 2023-12-03 DIAGNOSIS — Z7984 Long term (current) use of oral hypoglycemic drugs: Secondary | ICD-10-CM | POA: Diagnosis not present

## 2023-12-03 DIAGNOSIS — E1122 Type 2 diabetes mellitus with diabetic chronic kidney disease: Secondary | ICD-10-CM | POA: Diagnosis not present

## 2023-12-03 DIAGNOSIS — N2581 Secondary hyperparathyroidism of renal origin: Secondary | ICD-10-CM | POA: Diagnosis not present

## 2023-12-03 DIAGNOSIS — N182 Chronic kidney disease, stage 2 (mild): Secondary | ICD-10-CM | POA: Diagnosis not present

## 2023-12-09 ENCOUNTER — Other Ambulatory Visit: Payer: Self-pay | Admitting: "Endocrinology

## 2024-02-10 ENCOUNTER — Encounter

## 2024-02-26 ENCOUNTER — Ambulatory Visit

## 2024-05-18 ENCOUNTER — Ambulatory Visit: Admitting: "Endocrinology
